# Patient Record
Sex: Female | Born: 1940 | Race: White | Hispanic: No | Marital: Married | Smoking: Never smoker
Health system: Southern US, Community
[De-identification: ages and names within clinical notes are randomized; demographics above are authoritative.]

## PROBLEM LIST (undated history)

## (undated) DIAGNOSIS — M549 Dorsalgia, unspecified: Secondary | ICD-10-CM

## (undated) DIAGNOSIS — J449 Chronic obstructive pulmonary disease, unspecified: Secondary | ICD-10-CM

## (undated) DIAGNOSIS — I4891 Unspecified atrial fibrillation: Secondary | ICD-10-CM

## (undated) DIAGNOSIS — J45909 Unspecified asthma, uncomplicated: Secondary | ICD-10-CM

## (undated) DIAGNOSIS — I509 Heart failure, unspecified: Secondary | ICD-10-CM

## (undated) DIAGNOSIS — J40 Bronchitis, not specified as acute or chronic: Secondary | ICD-10-CM

## (undated) DIAGNOSIS — N189 Chronic kidney disease, unspecified: Secondary | ICD-10-CM

## (undated) DIAGNOSIS — J439 Emphysema, unspecified: Secondary | ICD-10-CM

## (undated) DIAGNOSIS — I10 Essential (primary) hypertension: Secondary | ICD-10-CM

## (undated) DIAGNOSIS — J9610 Chronic respiratory failure, unspecified whether with hypoxia or hypercapnia: Secondary | ICD-10-CM

## (undated) HISTORY — PX: ABDOMINAL HYSTERECTOMY: SHX81

## (undated) HISTORY — PX: PARTIAL HIP ARTHROPLASTY: SHX733

## (undated) HISTORY — PX: TOTAL HIP ARTHROPLASTY: SHX124

---

## 2005-01-22 ENCOUNTER — Ambulatory Visit: Payer: Self-pay | Admitting: Unknown Physician Specialty

## 2005-02-21 ENCOUNTER — Ambulatory Visit: Payer: Self-pay

## 2005-04-06 ENCOUNTER — Other Ambulatory Visit: Payer: Self-pay

## 2005-04-13 ENCOUNTER — Ambulatory Visit: Payer: Self-pay | Admitting: General Practice

## 2005-10-27 ENCOUNTER — Ambulatory Visit: Payer: Self-pay | Admitting: Rheumatology

## 2005-11-05 ENCOUNTER — Ambulatory Visit: Payer: Self-pay | Admitting: Internal Medicine

## 2006-02-07 ENCOUNTER — Inpatient Hospital Stay: Payer: Self-pay | Admitting: Cardiology

## 2006-02-07 ENCOUNTER — Other Ambulatory Visit: Payer: Self-pay

## 2006-11-26 ENCOUNTER — Ambulatory Visit: Payer: Self-pay | Admitting: Neurosurgery

## 2007-03-13 ENCOUNTER — Ambulatory Visit: Payer: Self-pay | Admitting: Internal Medicine

## 2007-04-21 ENCOUNTER — Ambulatory Visit: Payer: Self-pay | Admitting: General Practice

## 2007-05-06 ENCOUNTER — Inpatient Hospital Stay: Payer: Self-pay | Admitting: General Practice

## 2007-08-11 ENCOUNTER — Ambulatory Visit: Payer: Self-pay | Admitting: Internal Medicine

## 2008-03-16 ENCOUNTER — Ambulatory Visit: Payer: Self-pay | Admitting: Internal Medicine

## 2009-02-24 ENCOUNTER — Emergency Department: Payer: Self-pay | Admitting: Emergency Medicine

## 2009-03-30 ENCOUNTER — Ambulatory Visit: Payer: Self-pay | Admitting: Internal Medicine

## 2009-04-20 ENCOUNTER — Inpatient Hospital Stay: Payer: Self-pay | Admitting: Unknown Physician Specialty

## 2009-04-29 ENCOUNTER — Inpatient Hospital Stay: Payer: Self-pay | Admitting: Specialist

## 2010-02-17 ENCOUNTER — Ambulatory Visit: Payer: Self-pay | Admitting: Orthopedic Surgery

## 2010-02-28 ENCOUNTER — Encounter: Admission: RE | Admit: 2010-02-28 | Discharge: 2010-02-28 | Payer: Self-pay | Admitting: Orthopedic Surgery

## 2010-03-22 ENCOUNTER — Encounter: Admission: RE | Admit: 2010-03-22 | Discharge: 2010-03-22 | Payer: Self-pay | Admitting: Orthopedic Surgery

## 2010-04-03 ENCOUNTER — Emergency Department: Payer: Self-pay | Admitting: Emergency Medicine

## 2010-04-04 ENCOUNTER — Inpatient Hospital Stay (HOSPITAL_COMMUNITY): Admission: EM | Admit: 2010-04-04 | Discharge: 2010-04-18 | Payer: Self-pay | Admitting: Emergency Medicine

## 2010-04-05 ENCOUNTER — Encounter: Payer: Self-pay | Admitting: Cardiology

## 2010-04-07 ENCOUNTER — Encounter (INDEPENDENT_AMBULATORY_CARE_PROVIDER_SITE_OTHER): Payer: Self-pay | Admitting: Internal Medicine

## 2010-04-11 ENCOUNTER — Ambulatory Visit: Payer: Self-pay | Admitting: Physical Medicine & Rehabilitation

## 2010-04-18 ENCOUNTER — Encounter: Payer: Self-pay | Admitting: Internal Medicine

## 2010-05-08 ENCOUNTER — Encounter: Payer: Self-pay | Admitting: Internal Medicine

## 2010-06-03 ENCOUNTER — Inpatient Hospital Stay (HOSPITAL_COMMUNITY): Admission: EM | Admit: 2010-06-03 | Discharge: 2010-06-04 | Payer: Self-pay | Admitting: Emergency Medicine

## 2010-06-05 ENCOUNTER — Observation Stay (HOSPITAL_COMMUNITY): Admission: EM | Admit: 2010-06-05 | Discharge: 2010-06-06 | Payer: Self-pay | Admitting: Emergency Medicine

## 2010-09-16 ENCOUNTER — Emergency Department (HOSPITAL_COMMUNITY)
Admission: EM | Admit: 2010-09-16 | Discharge: 2010-09-16 | Payer: Self-pay | Source: Home / Self Care | Admitting: Emergency Medicine

## 2010-11-01 ENCOUNTER — Emergency Department (HOSPITAL_COMMUNITY)
Admission: EM | Admit: 2010-11-01 | Discharge: 2010-11-01 | Payer: Self-pay | Source: Home / Self Care | Admitting: Emergency Medicine

## 2010-11-02 NOTE — Consult Note (Addendum)
  NAME:  Maureen Wilson, Maureen Wilson NO.:  [id number]  MEDICAL RECORD NO.:  [id number]          PATIENT TYPE:  EMS  LOCATION:  ED                           FACILITY:  [facility]  PHYSICIAN:  Alvy Beal, MD    DATE OF BIRTH:  [birth year]  DATE OF CONSULTATION: DATE OF DISCHARGE:                                CONSULTATION   ADMITTING CHIEF COMPLAINT:  Left periprosthetic hip dislocation.  BRIEF HISTORY:  This is a very pleasant 70 year old woman who is well known to my practice as well as to me.  In August 2011, she had a postural indiscretion status post total hip prosthesis placement for a femoral neck fracture in July 2011.  She had a dislocation at that time, which I reduced successfully in a closed manner.  The patient had another dislocation in December 2011, which she states was also closed reduced.  Today, the patient was getting out of the shower and she went to sit down on her bed, felt a pop, and the left hip dislocated again. She said she has been unable to ambulate since, and so she presents to the emergency room for further evaluation and treatment.  PAST MEDICAL HISTORY: 1. Hypertension. 2. Chronic back pain. 3. Colitis. 4. Reflux. 5. Hypothyroidism. 6. Osteoarthritis. 7. Status post cardiac stents. 8. Hysterectomy. 9. Knee replacement. 10.Bilateral hip fractures in July requiring ORIF.  ALLERGIES:  She is allergic to: 1. ALTACE. 2. HYDROCHLOROTHIAZIDE. 3. SULFA.  Please refer to the ER nursing and the note for specifics on her current medications.  CLINICAL EXAMINATION:  She is a pleasant woman, appears her stated age, in no acute distress.  She is alert, oriented x3.  She has intact EHL, tibialis anterior, gastrocnemius strength, 2+ distal pulses in lower extremity.  Compartments are soft, nontender.  Incisions are well healed.  She has significant left hip pain with internal rotation of the leg.  Abdomen is soft and nontender.  No shortness of  breath or chest pain.  X-rays demonstrated posterior superior dislocation of the left hip.  No evidence of a femur fracture/periprosthetic fracture.  At this point in time, under conscious sedation provided by the ER staff, I will plan on performing a closed reduction.  She has several knee immobilizers at home.  I will have a word with PT in the emergency room after she recovers from the conscious sedation and I will discuss the case with Dr. Lequita Halt.  If it is okay with him, I will let her be discharged to home from the ER and arrange followup with Dr. Lequita Halt to discuss further treatment.     Alvy Beal, MD     DDB/MEDQ  D:  11/01/2010  T:  11/01/2010  Job:  [id number]  Electronically Signed by Venita Lick MD on 11/02/2010 11:15:55 AM

## 2010-11-21 ENCOUNTER — Encounter (HOSPITAL_COMMUNITY): Payer: Medicare Other | Attending: Orthopedic Surgery

## 2010-11-21 ENCOUNTER — Other Ambulatory Visit: Payer: Self-pay | Admitting: Orthopedic Surgery

## 2010-11-21 DIAGNOSIS — Z01812 Encounter for preprocedural laboratory examination: Secondary | ICD-10-CM | POA: Insufficient documentation

## 2010-11-21 DIAGNOSIS — Z01811 Encounter for preprocedural respiratory examination: Secondary | ICD-10-CM | POA: Insufficient documentation

## 2010-11-21 LAB — URINALYSIS, ROUTINE W REFLEX MICROSCOPIC
Hgb urine dipstick: NEGATIVE
Ketones, ur: NEGATIVE mg/dL
Protein, ur: NEGATIVE mg/dL
Urine Glucose, Fasting: NEGATIVE mg/dL

## 2010-11-21 LAB — SURGICAL PCR SCREEN: Staphylococcus aureus: POSITIVE — AB

## 2010-11-21 LAB — COMPREHENSIVE METABOLIC PANEL
ALT: 16 U/L (ref 0–35)
AST: 27 U/L (ref 0–37)
Alkaline Phosphatase: 63 U/L (ref 39–117)
CO2: 27 mEq/L (ref 19–32)
Chloride: 99 mEq/L (ref 96–112)
GFR calc Af Amer: 50 mL/min — ABNORMAL LOW (ref >60)
GFR calc non Af Amer: 41 mL/min — ABNORMAL LOW (ref >60)
Sodium: 136 mEq/L (ref 135–145)
Total Bilirubin: 1.1 mg/dL (ref 0.3–1.2)

## 2010-11-21 LAB — CBC
Hemoglobin: 13.1 g/dL (ref 12.0–15.0)
MCH: 33.1 pg (ref 26.0–34.0)
MCHC: 35.1 g/dL (ref 30.0–36.0)

## 2010-11-29 ENCOUNTER — Inpatient Hospital Stay (HOSPITAL_COMMUNITY)
Admission: RE | Admit: 2010-11-29 | Discharge: 2010-12-01 | DRG: 467 | Disposition: A | Discharge status: Home Health | Payer: Medicare Other | Source: Ambulatory Visit | Attending: Orthopedic Surgery | Admitting: Orthopedic Surgery

## 2010-11-29 DIAGNOSIS — D62 Acute posthemorrhagic anemia: Secondary | ICD-10-CM | POA: Diagnosis not present

## 2010-11-29 DIAGNOSIS — X500XXA Overexertion from strenuous movement or load, initial encounter: Secondary | ICD-10-CM | POA: Diagnosis present

## 2010-11-29 DIAGNOSIS — Z96649 Presence of unspecified artificial hip joint: Secondary | ICD-10-CM

## 2010-11-29 DIAGNOSIS — Y92009 Unspecified place in unspecified non-institutional (private) residence as the place of occurrence of the external cause: Secondary | ICD-10-CM

## 2010-11-29 DIAGNOSIS — I2510 Atherosclerotic heart disease of native coronary artery without angina pectoris: Secondary | ICD-10-CM | POA: Diagnosis present

## 2010-11-29 DIAGNOSIS — Z9861 Coronary angioplasty status: Secondary | ICD-10-CM

## 2010-11-29 DIAGNOSIS — K219 Gastro-esophageal reflux disease without esophagitis: Secondary | ICD-10-CM | POA: Diagnosis present

## 2010-11-29 DIAGNOSIS — E785 Hyperlipidemia, unspecified: Secondary | ICD-10-CM | POA: Diagnosis present

## 2010-11-29 DIAGNOSIS — T84029A Dislocation of unspecified internal joint prosthesis, initial encounter: Principal | ICD-10-CM | POA: Diagnosis present

## 2010-11-29 DIAGNOSIS — E871 Hypo-osmolality and hyponatremia: Secondary | ICD-10-CM | POA: Diagnosis not present

## 2010-11-29 DIAGNOSIS — I10 Essential (primary) hypertension: Secondary | ICD-10-CM | POA: Diagnosis present

## 2010-11-30 LAB — CBC
HCT: 30.5 % — ABNORMAL LOW (ref 36.0–46.0)
MCHC: 33.4 g/dL (ref 30.0–36.0)
MCV: 96.2 fL (ref 78.0–100.0)
RDW: 12.8 % (ref 11.5–15.5)

## 2010-11-30 LAB — BASIC METABOLIC PANEL
BUN: 12 mg/dL (ref 6–23)
Calcium: 8.8 mg/dL (ref 8.4–10.5)
GFR calc non Af Amer: 55 mL/min — ABNORMAL LOW (ref >60)
Glucose, Bld: 112 mg/dL — ABNORMAL HIGH (ref 70–99)

## 2010-12-01 LAB — PROTIME-INR: INR: 1.6 — ABNORMAL HIGH (ref 0.00–1.49)

## 2010-12-01 LAB — CBC
HCT: 29.5 % — ABNORMAL LOW (ref 36.0–46.0)
Hemoglobin: 9.7 g/dL — ABNORMAL LOW (ref 12.0–15.0)
MCHC: 32.9 g/dL (ref 30.0–36.0)
RBC: 3.04 MIL/uL — ABNORMAL LOW (ref 3.87–5.11)

## 2010-12-01 LAB — BASIC METABOLIC PANEL
CO2: 26 mEq/L (ref 19–32)
Calcium: 8.4 mg/dL (ref 8.4–10.5)
Chloride: 106 mEq/L (ref 96–112)
Glucose, Bld: 97 mg/dL (ref 70–99)
Sodium: 136 mEq/L (ref 135–145)

## 2010-12-05 ENCOUNTER — Ambulatory Visit: Payer: Self-pay | Admitting: Orthopedic Surgery

## 2010-12-14 NOTE — H&P (Signed)
  NAME:  YUN, GUTIERREZ NO.:  [id number]  MEDICAL RECORD NO.:  [id number]           PATIENT TYPE:  I  LOCATION:  1603                         FACILITY:  [facility]  PHYSICIAN:  Ollen Gross, M.D.    DATE OF BIRTH:  01/26/1941  DATE OF ADMISSION:  11/29/2010 DATE OF DISCHARGE:                             HISTORY & PHYSICAL   PAST SURGICAL HISTORY:  Left hip replacement, right hip partial replacement, back surgery, hysterectomy, cardiac catheterization with stenting.  FAMILY HISTORY:  Father with multiple strokes.  Mother with multiple strokes.  SOCIAL HISTORY:  Married.  Manufacturing and textile work.  Nonsmoker. No alcohol.  Two children.  Her husband will be assisting with care after surgery.  She does have a ramp entering her home.  REVIEW OF SYSTEMS:  GENERAL:  No fevers, chills or night sweats. NEUROLOGIC:  No seizures, syncope or paralysis.  RESPIRATORY:  No shortness of breath, productive cough or hemoptysis.  CARDIOVASCULAR: No chest pain, no orthopnea.  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  GENITOURINARY: No dysuria, hematuria or discharge.  MUSCULOSKELETAL:  Left hip.  PHYSICAL EXAMINATION:  VITAL SIGNS:  Pulse 56, respirations 12, blood pressure 142/68. GENERAL:  A 70 year old white female, well-nourished, well-developed, in no acute distress.  She is alert and oriented, cooperative and pleasant, accompanied by her husband and son. HEENT:  Normocephalic, atraumatic.  Pupils are equal, round and reactive.  EOMs intact. NECK:  Supple. CHEST:  Clear. HEART:  Regular rate and rhythm without murmur.  S1 and ST noted. ABDOMEN:  Soft, round, protuberant abdomen with bowel sounds present. RECTAL/BREASTS/GENITALIA:  Not done as not pertinent to present illness. EXTREMITIES:  Left hip flexion 100, internal rotation 20, external rotation 30, abduction 30.  IMPRESSION:  Left hip instability.  PLAN:   The patient was admitted to [facility] to undergo a revision left hip versus a constrained liner left hip.     Alexzandrew L. Julien Girt, P.A.C.   ______________________________ Ollen Gross, M.D.    ALP/MEDQ  D:  11/30/2010  T:  11/30/2010  Job:  [id number]  cc:   Dewaine Oats Fax: [phone]  Electronically Signed by Patrica Duel P.A.C. on 12/04/2010 11:91:47 PM Electronically Signed by Ollen Gross M.D. on 12/13/2010 03:44:41 PM

## 2010-12-14 NOTE — H&P (Signed)
  NAME:  DENAJAH, FARIAS NO.:  [id number]  MEDICAL RECORD NO.:  [id number]           PATIENT TYPE:  I  LOCATION:  1603                         FACILITY:  [facility]  PHYSICIAN:  Ollen Gross, M.D.    DATE OF BIRTH:  December 28, 1940  DATE OF ADMISSION:  11/29/2010 DATE OF DISCHARGE:                             HISTORY & PHYSICAL   CHIEF COMPLAINT:  Left hip instability.  HISTORY OF PRESENT ILLNESS:  The patient is a 70 year old female who is well-known to Dr. Ollen Gross having previously undergone left hip surgeries following hip fractures.  She has had a total-hip put in that side.  Unfortunately, she has had three dislocations and has developed instability with her hip and it is felt that she would benefit from undergoing conversion over to a constrained liner.  ALLERGIES:  ALTACE, HYDROCHLOROTHIAZIDE and SULFA.  Also MORPHINE causes some sickness.  CURRENT MEDICATIONS:  Lipitor, atenolol, Colace, budesonide, levothyroxine, Nexium, furosemide, amlodipine, meloxicam, generic Ambien, Singulair, potassium chloride, vitamin D, Ecotrin, Centrum Silver, promethazine, clonazepam and oxycodone.  PAST MEDICAL HISTORY:  Hypertension, chronic back pain, history of collagen colitis, reflux disease, hypothyroidism, osteoarthritis, hypercholesterolemia, coronary arterial disease status post cardiac stenting.  PAST SURGICAL HISTORY:  Cardiac catheterization with stents, right hip partial replacement, right femur periprosthetic fracture.     Alexzandrew L. Julien Girt, P.A.C.   ______________________________ Ollen Gross, M.D.    ALP/MEDQ  D:  11/30/2010  T:  11/30/2010  Job:  [id number]  cc:   Dewaine Oats Fax: [phone]  Electronically Signed by Patrica Duel P.A.C. on 12/04/2010 06:23:12 PM Electronically Signed by Ollen Gross M.D. on 12/13/2010 03:44:38 PM

## 2010-12-14 NOTE — Op Note (Signed)
NAME:  Maureen Wilson, Maureen Wilson NO.:  [id number]  MEDICAL RECORD NO.:  [id number]           PATIENT TYPE:  I  LOCATION:  1603                         FACILITY:  [facility]  PHYSICIAN:  Ollen Gross, M.D.    DATE OF BIRTH:  June 08, 1941  DATE OF PROCEDURE:  11/29/2010 DATE OF DISCHARGE:                              OPERATIVE REPORT   PREOPERATIVE DIAGNOSIS:  Left total-hip arthroplasty instability.  POSTOPERATIVE DIAGNOSIS:  Left total-hip arthroplasty instability.  PROCEDURE:  Left acetabular revision to constrained liner.  SURGEON:  Ollen Gross, M.D.  ASSISTANT:  Alexzandrew L. Perkins, P.A.C.  ANESTHESIA:  General.  ESTIMATED BLOOD LOSS:  200.  DRAINS:  Hemovac x1.  COMPLICATIONS:  None.  CONDITION:  Stable to recovery room.  BRIEF CLINICAL NOTE:  Maureen Wilson is a 70 year old female who had a left total-hip arthroplasty done for a femoral neck fracture last June.  She has had three episodes of posterior dislocation.  Components all appear to be in good position on x-ray.  She has a lot of proximal muscular weakness and it is felt that a lot of the instability is due to soft tissue laxity.  She presents now for revision of the acetabular component versus placement of a constrained liner if the components are in good position.  PROCEDURE IN DETAIL:  After successful administration of general anesthetic, the patient was placed in the right lateral decubitus position with the left side up and held with the hip positioner.  Left lower extremity was isolated from her perineum with plastic drapes and prepped and draped in the usual sterile fashion.  Part of her previous posterolateral incision was reutilized.  Skin cut with a 10 blade through subcutaneous tissue to the level of the fascia lata, which was incised in line with the skin incision.  The sciatic nerve was palpated and protected.  The posterior pseudocapsule was incised off of the femur.  The hip was  then explored.  No evidence of any abnormal- appearing fluid in the joint.  Components appear to be in good position with a combined anteversion of about 40 degrees.  I dislocated the hip in a position of 90 degrees flexion, 40 degrees adduction and about 25- 30 degrees of internal rotation.  I removed the femoral head with a bone tamp.  Acetabular anteversion was about 25 degrees, which matches her native anteversion.  We created a pocket anteriorly and translocated the femur anteriorly to gain acetabular exposure.  Acetabular alignment was anatomic.  Posteriorly she was a few degrees anteverted to the initial tuberosity and her abduction angle was about 45 degrees.  I removed the acetabular liner from the acetabular shell.  The shell was well ingrown and I was very comfortable with the position of the shell.  It was essentially matching her native anatomy.  I felt that, given the excellent position of the components and the fact that she dislocates at a fairly highly rotated angle, that we were best off placing a constrained liner to keep the hip stable.  We then placed the 52-mm neutral +4 constrained liner for a 32 head.  We impacted it into the acetabular  shell and I tested it and it was circumferentially impacted.  I then placed a 32 +3 head onto the trunnion of the femoral component.  This was the same size head that we had on previously.  I used a new 32 +3, as there were some scratches on the one that was removed.  The hip was then reduced with audible locking into place.  We then impacted the locking ring circumferentially.  Range of motion was full extension and full external rotation at 70 degrees flexion, 40 degrees adduction, 50 degrees internal rotation, 90 degrees flexion, 40 degrees abduction and about 40 degrees internal rotation. There was no impingement throughout any of this range of motion.  The wound was then copiously irrigated with saline solution and  the posterior pseudocapsule reattached to the femur through drill holes with Ethibond suture.  Fascia lata was closed over a Hemovac drain with interrupted #1 Vicryl, subcutaneous closed with #1-0 and #2-0 Vicryl and subcuticular running 4-0 Monocryl.  The Hemovac drain was hooked to suction.  The incision was cleaned and dried and Steri-Strips and a bulky sterile dressing were applied.  She was then placed into a knee immobilizer, awakened and transferred to recovery in stable condition.     Ollen Gross, M.D.     FA/MEDQ  D:  11/29/2010  T:  11/30/2010  Job:  [id number]  Electronically Signed by Ollen Gross M.D. on 12/13/2010 03:44:36 PM

## 2010-12-14 NOTE — Discharge Summary (Signed)
[name], [name]            ACCOUNT NO.:  [id number]  MEDICAL RECORD NO.:  [id number]           PATIENT TYPE:  I  LOCATION:  1603                         FACILITY:  [facility]  PHYSICIAN:  Ollen Gross, M.D.    DATE OF BIRTH:  July 20, 1941  DATE OF ADMISSION:  11/29/2010 DATE OF DISCHARGE:  12/01/2010                              DISCHARGE SUMMARY   ADMITTING DIAGNOSES: 1. Left hip instability. 2. Hypertension. 3. Chronic back pain. 4. History collagenous colitis. 5. Reflux disease. 6. Hypothyroidism. 7. Osteoarthritis. 8. Hypercholesterolemia. 9. Coronary arterial disease, status post cardiac stenting.  DISCHARGE DIAGNOSES: 1. Left hip instability, status post left acetabular revision,     conversion to constrained liner. 2. Postop acute blood loss anemia, did not require transfusion. 3. Postop hyponatremia, improved. 4. Hypertension. 5. Chronic back pain. 6. History collagenous colitis. 7. Reflux disease. 8. Hypothyroidism. 9. Osteoarthritis. 10.Hypercholesterolemia. 11.Coronary arterial disease, status post cardiac stenting.  PROCEDURE:  Left acetabular revision, conversion to a constrained liner. Surgeon, Dr. Lequita Halt.  Assistant, Alexzandrew L. Perkins, P.A.C. Anesthesia, general.  Blood loss, 200 cc.  CONSULTS:  None.  BRIEF HISTORY:  Maureen Wilson is a 70 year old female with left total hip done for femoral neck fracture last June.  She had 3 episodes of history of dislocations.  Components all appeared to be in good position.  She has some proximal muscle weakness and felt be leaving to instability and soft tissue laxity.  It was felt she would benefit from undergoing conversion or revision.  LABORATORY DATA:  Preop CBC showed hemoglobin 13.1, hematocrit 37.3, white cell count 6.8, platelets 215.  PT/INR 13.0 and 0.96 with PTT of 31.  Chem panel on admission, slightly elevated creatinine of 1.28. Remaining Chem panel within normal limits.  Preop UA was  negative. Blood group type O+.  Nasal swabs were positive for staph aureus and positive for MRSA.  Postop hemoglobin down to 10.  Last done H and H of 9.7 and 29.5.  Serial protime followed per Coumadin protocol.  Last done PT/INR on the date of discharge of 19.2 and 1.60.  Serial BMETs were followed for 48 hours.  Sodium went from 136 to 134, back up to 136. Remaining electrolytes remained within normal limits.  X-rays, 2-view chest, dated September 16, 2010, no acute findings.  EKG dated November 09, 2010, sinus bradycardia, otherwise normal, on beta blocker atenolol, normal EKG confirmed by Dr. Zollie Scale COURSE:  The patient admitted to [facility], taken to OR, underwent above-stated procedure without complications.  The patient tolerated the procedure well, later transferred to the recovery room on orthopedic floor, started on p.o. and IV analgesia pain control following surgery.  She was allowed to be weightbearing as tolerated. Since she only had had liner changed out to constrained liner, we didnot have to adjust or revise the metal components.  She was pretty sore on the morning of day #1 but she was able to get up, had decent urinary output.  Sodium was down, felt to be a dilutional component because she did have a positive volume postoperatively but she was diuresing fluid well, it was allowed  to  construct back up on its own.  We DC'ed the knee immobilizer, got her up weightbearing as tolerated, walking about 30 feet.  By day #2, she was seen by Dr. Lequita Halt and she was doing fairly well and it was felt as long as she did well with the physical therapy that she would be able to go home after the second therapy session on afternoon of December 01, 2010.  She was in agreement with this, arranging for home health and plan was to be discharged home later this evening.  DISCHARGE/PLAN: 1. The patient be discharged today on December 01, 2010. 2. Discharge  diagnoses, please see above. 3. Discharge meds.  Coumadin protocol for 3 weeks.  Robaxin p.r.n.     spasm.  She has her pain medication at home which is the oxycodone.     Continue amlodipine, baby aspirin, atenolol, clonazepam, Colace,     budesonide, furosemide, levothyroxine, Lipitor, Nexium, potassium     chloride, promethazine, Singulair, and generic Ambien.  DIET:  Heart-healthy diet.  ACTIVITY:  She is weightbearing as tolerated.  Total knee protocol, home health PT, home health nursing.  Followup in 2 weeks.  DISPOSITION:  Home.  CONDITION ON DISCHARGE:  Improving.     Alexzandrew L. Julien Girt, P.A.C.   ______________________________ Ollen Gross, M.D.    ALP/MEDQ  D:  12/01/2010  T:  12/01/2010  Job:  [id number]  cc:   Dewaine Oats Fax: [phone]  Electronically Signed by Patrica Duel P.A.C. on 12/04/2010 86:57:84 PM Electronically Signed by Ollen Gross M.D. on 12/13/2010 03:44:33 PM

## 2010-12-19 LAB — DIFFERENTIAL
Basophils Relative: 1 % (ref 0–1)
Eosinophils Absolute: 0.1 10*3/uL (ref 0.0–0.7)
Eosinophils Relative: 1 % (ref 0–5)
Lymphs Abs: 1.8 10*3/uL (ref 0.7–4.0)
Monocytes Absolute: 0.9 10*3/uL (ref 0.1–1.0)
Monocytes Relative: 13 % — ABNORMAL HIGH (ref 3–12)
Neutrophils Relative %: 59 % (ref 43–77)

## 2010-12-19 LAB — CBC
HCT: 35.1 % — ABNORMAL LOW (ref 36.0–46.0)
Hemoglobin: 12 g/dL (ref 12.0–15.0)
MCH: 32.7 pg (ref 26.0–34.0)
MCHC: 34.2 g/dL (ref 30.0–36.0)
MCV: 95.6 fL (ref 78.0–100.0)

## 2010-12-19 LAB — POCT I-STAT, CHEM 8
BUN: 21 mg/dL (ref 6–23)
Calcium, Ion: 1.15 mmol/L (ref 1.12–1.32)
Chloride: 104 mEq/L (ref 96–112)
Creatinine, Ser: 1.9 mg/dL — ABNORMAL HIGH (ref 0.4–1.2)
Glucose, Bld: 95 mg/dL (ref 70–99)
Potassium: 4 mEq/L (ref 3.5–5.1)

## 2010-12-22 LAB — BASIC METABOLIC PANEL
BUN: 9 mg/dL (ref 6–23)
CO2: 25 mEq/L (ref 19–32)
CO2: 26 mEq/L (ref 19–32)
Calcium: 9 mg/dL (ref 8.4–10.5)
Chloride: 103 mEq/L (ref 96–112)
Creatinine, Ser: 0.92 mg/dL (ref 0.4–1.2)
Creatinine, Ser: 0.98 mg/dL (ref 0.4–1.2)
GFR calc Af Amer: >60 mL/min (ref >60)
GFR calc non Af Amer: >60 mL/min (ref >60)
Potassium: 4.2 mEq/L (ref 3.5–5.1)
Sodium: 140 mEq/L (ref 135–145)

## 2010-12-22 LAB — COMPREHENSIVE METABOLIC PANEL
ALT: 20 U/L (ref 0–35)
AST: 38 U/L — ABNORMAL HIGH (ref 0–37)
Alkaline Phosphatase: 72 U/L (ref 39–117)
CO2: 24 mEq/L (ref 19–32)
Calcium: 9.4 mg/dL (ref 8.4–10.5)
Chloride: 105 mEq/L (ref 96–112)
GFR calc Af Amer: >60 mL/min (ref >60)
GFR calc non Af Amer: 51 mL/min — ABNORMAL LOW (ref >60)
Glucose, Bld: 93 mg/dL (ref 70–99)
Potassium: 4.4 mEq/L (ref 3.5–5.1)
Sodium: 138 mEq/L (ref 135–145)
Total Bilirubin: 1.5 mg/dL — ABNORMAL HIGH (ref 0.3–1.2)

## 2010-12-22 LAB — DIFFERENTIAL
Basophils Absolute: 0.1 10*3/uL (ref 0.0–0.1)
Basophils Relative: 1 % (ref 0–1)
Eosinophils Absolute: 0 10*3/uL (ref 0.0–0.7)
Eosinophils Absolute: 0.1 10*3/uL (ref 0.0–0.7)
Eosinophils Relative: 1 % (ref 0–5)
Eosinophils Relative: 2 % (ref 0–5)
Lymphocytes Relative: 39 % (ref 12–46)
Lymphs Abs: 1.9 10*3/uL (ref 0.7–4.0)
Lymphs Abs: 2.4 10*3/uL (ref 0.7–4.0)
Monocytes Absolute: 0.6 10*3/uL (ref 0.1–1.0)
Neutrophils Relative %: 51 % (ref 43–77)

## 2010-12-22 LAB — CBC
HCT: 34.9 % — ABNORMAL LOW (ref 36.0–46.0)
HCT: 35.9 % — ABNORMAL LOW (ref 36.0–46.0)
Hemoglobin: 10.3 g/dL — ABNORMAL LOW (ref 12.0–15.0)
Hemoglobin: 11.8 g/dL — ABNORMAL LOW (ref 12.0–15.0)
MCH: 31.3 pg (ref 26.0–34.0)
MCH: 32.6 pg (ref 26.0–34.0)
MCV: 95.7 fL (ref 78.0–100.0)
Platelets: 223 10*3/uL (ref 150–400)
Platelets: ADEQUATE 10*3/uL (ref 150–400)
RBC: 3.29 MIL/uL — ABNORMAL LOW (ref 3.87–5.11)
RBC: 3.64 MIL/uL — ABNORMAL LOW (ref 3.87–5.11)
RDW: 15.6 % — ABNORMAL HIGH (ref 11.5–15.5)
WBC: 5.4 10*3/uL (ref 4.0–10.5)
WBC: 6.5 10*3/uL (ref 4.0–10.5)

## 2010-12-22 LAB — URINALYSIS, ROUTINE W REFLEX MICROSCOPIC
Bilirubin Urine: NEGATIVE
Glucose, UA: NEGATIVE mg/dL
Ketones, ur: NEGATIVE mg/dL
Protein, ur: NEGATIVE mg/dL
pH: 6 (ref 5.0–8.0)

## 2010-12-22 LAB — URINE MICROSCOPIC-ADD ON

## 2010-12-22 LAB — PROTIME-INR: Prothrombin Time: 13.5 seconds (ref 11.6–15.2)

## 2010-12-22 LAB — APTT: aPTT: 33 seconds (ref 24–37)

## 2010-12-22 LAB — TSH: TSH: 3.074 u[IU]/mL (ref 0.350–4.500)

## 2010-12-22 LAB — CK: Total CK: 229 U/L — ABNORMAL HIGH (ref 7–177)

## 2010-12-24 LAB — CBC
HCT: 27.3 % — ABNORMAL LOW (ref 36.0–46.0)
HCT: 28 % — ABNORMAL LOW (ref 36.0–46.0)
HCT: 30.7 % — ABNORMAL LOW (ref 36.0–46.0)
HCT: 33.2 % — ABNORMAL LOW (ref 36.0–46.0)
HCT: 33.5 % — ABNORMAL LOW (ref 36.0–46.0)
HCT: 34.3 % — ABNORMAL LOW (ref 36.0–46.0)
HCT: 35.5 % — ABNORMAL LOW (ref 36.0–46.0)
HCT: 36.3 % (ref 36.0–46.0)
Hemoglobin: 10.1 g/dL — ABNORMAL LOW (ref 12.0–15.0)
Hemoglobin: 10.9 g/dL — ABNORMAL LOW (ref 12.0–15.0)
Hemoglobin: 11 g/dL — ABNORMAL LOW (ref 12.0–15.0)
Hemoglobin: 11.6 g/dL — ABNORMAL LOW (ref 12.0–15.0)
Hemoglobin: 11.7 g/dL — ABNORMAL LOW (ref 12.0–15.0)
Hemoglobin: 12 g/dL (ref 12.0–15.0)
Hemoglobin: 12.3 g/dL (ref 12.0–15.0)
Hemoglobin: 9.8 g/dL — ABNORMAL LOW (ref 12.0–15.0)
MCH: 32.2 pg (ref 26.0–34.0)
MCH: 32.3 pg (ref 26.0–34.0)
MCH: 32.7 pg (ref 26.0–34.0)
MCH: 32.7 pg (ref 26.0–34.0)
MCH: 33.7 pg (ref 26.0–34.0)
MCH: 33.9 pg (ref 26.0–34.0)
MCH: 34 pg (ref 26.0–34.0)
MCHC: 34.7 g/dL (ref 30.0–36.0)
MCHC: 34.8 g/dL (ref 30.0–36.0)
MCHC: 35 g/dL (ref 30.0–36.0)
MCHC: 35.8 g/dL (ref 30.0–36.0)
MCV: 91.6 fL (ref 78.0–100.0)
MCV: 92.4 fL (ref 78.0–100.0)
MCV: 92.8 fL (ref 78.0–100.0)
MCV: 93.5 fL (ref 78.0–100.0)
MCV: 94.1 fL (ref 78.0–100.0)
MCV: 96.4 fL (ref 78.0–100.0)
MCV: 97.2 fL (ref 78.0–100.0)
MCV: 97.3 fL (ref 78.0–100.0)
MCV: 97.3 fL (ref 78.0–100.0)
MCV: 98.9 fL (ref 78.0–100.0)
Platelets: 157 10*3/uL (ref 150–400)
Platelets: 167 10*3/uL (ref 150–400)
Platelets: 182 10*3/uL (ref 150–400)
Platelets: 200 10*3/uL (ref 150–400)
Platelets: 200 10*3/uL (ref 150–400)
RBC: 2.83 MIL/uL — ABNORMAL LOW (ref 3.87–5.11)
RBC: 2.88 MIL/uL — ABNORMAL LOW (ref 3.87–5.11)
RBC: 3.21 MIL/uL — ABNORMAL LOW (ref 3.87–5.11)
RBC: 3.38 MIL/uL — ABNORMAL LOW (ref 3.87–5.11)
RBC: 3.53 MIL/uL — ABNORMAL LOW (ref 3.87–5.11)
RBC: 3.63 MIL/uL — ABNORMAL LOW (ref 3.87–5.11)
RBC: 3.65 MIL/uL — ABNORMAL LOW (ref 3.87–5.11)
RBC: 3.67 MIL/uL — ABNORMAL LOW (ref 3.87–5.11)
RBC: 3.91 MIL/uL (ref 3.87–5.11)
RDW: 13.5 % (ref 11.5–15.5)
RDW: 13.8 % (ref 11.5–15.5)
WBC: 13.3 10*3/uL — ABNORMAL HIGH (ref 4.0–10.5)
WBC: 5.6 10*3/uL (ref 4.0–10.5)
WBC: 8.6 10*3/uL (ref 4.0–10.5)
WBC: 8.8 10*3/uL (ref 4.0–10.5)
WBC: 9.2 10*3/uL (ref 4.0–10.5)

## 2010-12-24 LAB — BASIC METABOLIC PANEL
BUN: 12 mg/dL (ref 6–23)
BUN: 15 mg/dL (ref 6–23)
BUN: 20 mg/dL (ref 6–23)
BUN: 6 mg/dL (ref 6–23)
BUN: 6 mg/dL (ref 6–23)
CO2: 21 mEq/L (ref 19–32)
CO2: 21 mEq/L (ref 19–32)
CO2: 22 mEq/L (ref 19–32)
CO2: 24 mEq/L (ref 19–32)
CO2: 24 mEq/L (ref 19–32)
CO2: 25 mEq/L (ref 19–32)
CO2: 25 mEq/L (ref 19–32)
CO2: 25 mEq/L (ref 19–32)
CO2: 26 mEq/L (ref 19–32)
CO2: 26 mEq/L (ref 19–32)
CO2: 26 mEq/L (ref 19–32)
Calcium: 8.2 mg/dL — ABNORMAL LOW (ref 8.4–10.5)
Calcium: 8.3 mg/dL — ABNORMAL LOW (ref 8.4–10.5)
Calcium: 8.4 mg/dL (ref 8.4–10.5)
Calcium: 8.6 mg/dL (ref 8.4–10.5)
Chloride: 101 mEq/L (ref 96–112)
Chloride: 102 mEq/L (ref 96–112)
Chloride: 103 mEq/L (ref 96–112)
Chloride: 103 mEq/L (ref 96–112)
Chloride: 104 mEq/L (ref 96–112)
Chloride: 106 mEq/L (ref 96–112)
Chloride: 106 mEq/L (ref 96–112)
Chloride: 106 mEq/L (ref 96–112)
Chloride: 106 mEq/L (ref 96–112)
Chloride: 97 mEq/L (ref 96–112)
Chloride: 97 mEq/L (ref 96–112)
Creatinine, Ser: 0.93 mg/dL (ref 0.4–1.2)
Creatinine, Ser: 1.06 mg/dL (ref 0.4–1.2)
Creatinine, Ser: 1.11 mg/dL (ref 0.4–1.2)
Creatinine, Ser: 1.12 mg/dL (ref 0.4–1.2)
Creatinine, Ser: 1.13 mg/dL (ref 0.4–1.2)
Creatinine, Ser: 1.3 mg/dL — ABNORMAL HIGH (ref 0.4–1.2)
GFR calc Af Amer: 48 mL/min — ABNORMAL LOW (ref >60)
GFR calc Af Amer: 49 mL/min — ABNORMAL LOW (ref >60)
GFR calc Af Amer: 52 mL/min — ABNORMAL LOW (ref >60)
GFR calc Af Amer: 58 mL/min — ABNORMAL LOW (ref >60)
GFR calc Af Amer: 58 mL/min — ABNORMAL LOW (ref >60)
GFR calc Af Amer: 58 mL/min — ABNORMAL LOW (ref >60)
GFR calc Af Amer: 59 mL/min — ABNORMAL LOW (ref >60)
GFR calc Af Amer: >60 mL/min (ref >60)
GFR calc Af Amer: >60 mL/min (ref >60)
GFR calc Af Amer: >60 mL/min (ref >60)
GFR calc non Af Amer: 40 mL/min — ABNORMAL LOW (ref >60)
GFR calc non Af Amer: 48 mL/min — ABNORMAL LOW (ref >60)
GFR calc non Af Amer: >60 mL/min (ref >60)
Glucose, Bld: 100 mg/dL — ABNORMAL HIGH (ref 70–99)
Glucose, Bld: 92 mg/dL (ref 70–99)
Glucose, Bld: 95 mg/dL (ref 70–99)
Glucose, Bld: 96 mg/dL (ref 70–99)
Potassium: 3.2 mEq/L — ABNORMAL LOW (ref 3.5–5.1)
Potassium: 3.7 mEq/L (ref 3.5–5.1)
Potassium: 3.7 mEq/L (ref 3.5–5.1)
Potassium: 3.8 mEq/L (ref 3.5–5.1)
Potassium: 4.1 mEq/L (ref 3.5–5.1)
Potassium: 4.2 mEq/L (ref 3.5–5.1)
Potassium: 4.6 mEq/L (ref 3.5–5.1)
Potassium: 4.8 mEq/L (ref 3.5–5.1)
Sodium: 124 mEq/L — ABNORMAL LOW (ref 135–145)
Sodium: 125 mEq/L — ABNORMAL LOW (ref 135–145)
Sodium: 132 mEq/L — ABNORMAL LOW (ref 135–145)
Sodium: 133 mEq/L — ABNORMAL LOW (ref 135–145)
Sodium: 134 mEq/L — ABNORMAL LOW (ref 135–145)
Sodium: 134 mEq/L — ABNORMAL LOW (ref 135–145)
Sodium: 135 mEq/L (ref 135–145)
Sodium: 139 mEq/L (ref 135–145)

## 2010-12-24 LAB — CROSSMATCH: Antibody Screen: NEGATIVE

## 2010-12-24 LAB — URINALYSIS, ROUTINE W REFLEX MICROSCOPIC
Ketones, ur: NEGATIVE mg/dL
Nitrite: NEGATIVE
pH: 5.5 (ref 5.0–8.0)

## 2010-12-24 LAB — PROTIME-INR
INR: 1.05 (ref 0.00–1.49)
INR: 2.02 — ABNORMAL HIGH (ref 0.00–1.49)
INR: 2.4 — ABNORMAL HIGH (ref 0.00–1.49)
INR: 3.31 — ABNORMAL HIGH (ref 0.00–1.49)
Prothrombin Time: 14.6 seconds (ref 11.6–15.2)
Prothrombin Time: 17.2 seconds — ABNORMAL HIGH (ref 11.6–15.2)
Prothrombin Time: 18.5 seconds — ABNORMAL HIGH (ref 11.6–15.2)
Prothrombin Time: 22.7 seconds — ABNORMAL HIGH (ref 11.6–15.2)
Prothrombin Time: 36 seconds — ABNORMAL HIGH (ref 11.6–15.2)

## 2010-12-24 LAB — OSMOLALITY: Osmolality: 262 mOsm/kg — ABNORMAL LOW (ref 275–300)

## 2010-12-24 LAB — URINE MICROSCOPIC-ADD ON

## 2010-12-24 LAB — URINE CULTURE: Colony Count: 3000

## 2010-12-24 LAB — HEMOGLOBIN AND HEMATOCRIT, BLOOD: HCT: 40.5 % (ref 36.0–46.0)

## 2010-12-24 LAB — CREATININE, URINE, RANDOM: Creatinine, Urine: 73.6 mg/dL

## 2010-12-24 LAB — OSMOLALITY, URINE: Osmolality, Ur: 454 mOsm/kg (ref 390–1090)

## 2010-12-25 LAB — CBC
HCT: 33.1 % — ABNORMAL LOW (ref 36.0–46.0)
HCT: 37.6 % (ref 36.0–46.0)
Hemoglobin: 11.5 g/dL — ABNORMAL LOW (ref 12.0–15.0)
Hemoglobin: 12.9 g/dL (ref 12.0–15.0)
MCH: 34 pg (ref 26.0–34.0)
MCHC: 34.4 g/dL (ref 30.0–36.0)
MCV: 97.7 fL (ref 78.0–100.0)
RBC: 3.39 MIL/uL — ABNORMAL LOW (ref 3.87–5.11)

## 2010-12-25 LAB — HEMOGLOBIN A1C: Mean Plasma Glucose: 126 mg/dL — ABNORMAL HIGH (ref <117)

## 2010-12-25 LAB — COMPREHENSIVE METABOLIC PANEL
BUN: 15 mg/dL (ref 6–23)
CO2: 26 mEq/L (ref 19–32)
Chloride: 105 mEq/L (ref 96–112)
Creatinine, Ser: 1.34 mg/dL — ABNORMAL HIGH (ref 0.4–1.2)
GFR calc non Af Amer: 39 mL/min — ABNORMAL LOW (ref >60)
Total Bilirubin: 1.3 mg/dL — ABNORMAL HIGH (ref 0.3–1.2)

## 2010-12-25 LAB — BASIC METABOLIC PANEL
GFR calc non Af Amer: 33 mL/min — ABNORMAL LOW (ref >60)
Glucose, Bld: 109 mg/dL — ABNORMAL HIGH (ref 70–99)
Potassium: 4 mEq/L (ref 3.5–5.1)
Sodium: 134 mEq/L — ABNORMAL LOW (ref 135–145)

## 2010-12-25 LAB — URINALYSIS, ROUTINE W REFLEX MICROSCOPIC
Glucose, UA: NEGATIVE mg/dL
Specific Gravity, Urine: 1.02 (ref 1.005–1.030)
Urobilinogen, UA: 0.2 mg/dL (ref 0.0–1.0)
pH: 5.5 (ref 5.0–8.0)

## 2010-12-25 LAB — TSH: TSH: 1.314 u[IU]/mL (ref 0.350–4.500)

## 2010-12-25 LAB — URINE CULTURE

## 2010-12-25 LAB — URINE MICROSCOPIC-ADD ON

## 2010-12-25 LAB — DIFFERENTIAL
Basophils Relative: 1 % (ref 0–1)
Lymphocytes Relative: 9 % — ABNORMAL LOW (ref 12–46)
Monocytes Absolute: 1.2 10*3/uL — ABNORMAL HIGH (ref 0.1–1.0)
Monocytes Relative: 9 % (ref 3–12)
Neutro Abs: 10.8 10*3/uL — ABNORMAL HIGH (ref 1.7–7.7)

## 2011-01-21 ENCOUNTER — Inpatient Hospital Stay: Payer: Self-pay | Admitting: Internal Medicine

## 2011-04-19 ENCOUNTER — Ambulatory Visit: Payer: Self-pay | Admitting: Cardiology

## 2011-05-02 ENCOUNTER — Ambulatory Visit: Payer: Self-pay | Admitting: Internal Medicine

## 2011-06-09 ENCOUNTER — Emergency Department: Payer: Self-pay | Admitting: Emergency Medicine

## 2011-08-24 ENCOUNTER — Ambulatory Visit: Payer: Self-pay | Admitting: Internal Medicine

## 2011-11-29 ENCOUNTER — Ambulatory Visit: Payer: Self-pay | Admitting: Surgery

## 2011-11-29 LAB — PROTIME-INR: INR: 2

## 2011-12-04 ENCOUNTER — Ambulatory Visit: Payer: Self-pay | Admitting: Surgery

## 2012-03-25 ENCOUNTER — Ambulatory Visit: Payer: Self-pay | Admitting: Surgery

## 2012-03-25 LAB — CREATININE, SERUM
Creatinine: 1.59 mg/dL — ABNORMAL HIGH (ref 0.60–1.30)
EGFR (Non-African Amer.): 32 — ABNORMAL LOW

## 2012-05-07 ENCOUNTER — Ambulatory Visit: Payer: Self-pay | Admitting: Internal Medicine

## 2013-06-01 ENCOUNTER — Ambulatory Visit: Payer: Self-pay | Admitting: Internal Medicine

## 2013-06-05 ENCOUNTER — Ambulatory Visit: Payer: Self-pay | Admitting: Internal Medicine

## 2013-07-15 ENCOUNTER — Ambulatory Visit: Payer: Self-pay | Admitting: Unknown Physician Specialty

## 2013-08-20 ENCOUNTER — Ambulatory Visit: Payer: Self-pay | Admitting: Cardiology

## 2013-11-02 ENCOUNTER — Emergency Department: Payer: Self-pay | Admitting: Emergency Medicine

## 2013-11-02 LAB — COMPREHENSIVE METABOLIC PANEL
ALBUMIN: 3.5 g/dL (ref 3.4–5.0)
ALK PHOS: 97 U/L
ANION GAP: 7 (ref 7–16)
AST: 41 U/L — AB (ref 15–37)
BUN: 16 mg/dL (ref 7–18)
Bilirubin,Total: 1.2 mg/dL — ABNORMAL HIGH (ref 0.2–1.0)
CHLORIDE: 102 mmol/L (ref 98–107)
CREATININE: 1.33 mg/dL — AB (ref 0.60–1.30)
Calcium, Total: 8.6 mg/dL (ref 8.5–10.1)
Co2: 23 mmol/L (ref 21–32)
EGFR (African American): 46 — ABNORMAL LOW
EGFR (Non-African Amer.): 40 — ABNORMAL LOW
GLUCOSE: 102 mg/dL — AB (ref 65–99)
Osmolality: 266 (ref 275–301)
POTASSIUM: 4.2 mmol/L (ref 3.5–5.1)
SGPT (ALT): 29 U/L (ref 12–78)
SODIUM: 132 mmol/L — AB (ref 136–145)
TOTAL PROTEIN: 6.7 g/dL (ref 6.4–8.2)

## 2013-11-02 LAB — CBC WITH DIFFERENTIAL/PLATELET
BASOS PCT: 0.7 %
Basophil #: 0.1 10*3/uL (ref 0.0–0.1)
EOS ABS: 0 10*3/uL (ref 0.0–0.7)
Eosinophil %: 0.6 %
HCT: 36.1 % (ref 35.0–47.0)
HGB: 11.9 g/dL — AB (ref 12.0–16.0)
Lymphocyte #: 0.8 10*3/uL — ABNORMAL LOW (ref 1.0–3.6)
Lymphocyte %: 9.9 %
MCH: 32.3 pg (ref 26.0–34.0)
MCHC: 32.9 g/dL (ref 32.0–36.0)
MCV: 98 fL (ref 80–100)
MONO ABS: 0.7 x10 3/mm (ref 0.2–0.9)
Monocyte %: 9.3 %
NEUTROS ABS: 6.2 10*3/uL (ref 1.4–6.5)
Neutrophil %: 79.5 %
Platelet: 116 10*3/uL — ABNORMAL LOW (ref 150–440)
RBC: 3.68 10*6/uL — AB (ref 3.80–5.20)
RDW: 13.7 % (ref 11.5–14.5)
WBC: 7.9 10*3/uL (ref 3.6–11.0)

## 2013-11-02 LAB — PROTIME-INR
INR: 3
Prothrombin Time: 30.5 secs — ABNORMAL HIGH (ref 11.5–14.7)

## 2013-11-02 LAB — PRO B NATRIURETIC PEPTIDE: B-Type Natriuretic Peptide: 8669 pg/mL — ABNORMAL HIGH (ref 0–125)

## 2013-11-07 LAB — CULTURE, BLOOD (SINGLE)

## 2014-03-16 ENCOUNTER — Emergency Department: Payer: Self-pay | Admitting: Emergency Medicine

## 2014-03-16 LAB — COMPREHENSIVE METABOLIC PANEL
ALBUMIN: 3.7 g/dL (ref 3.4–5.0)
ALK PHOS: 108 U/L
AST: 36 U/L (ref 15–37)
Anion Gap: 7 (ref 7–16)
BUN: 16 mg/dL (ref 7–18)
Bilirubin,Total: 1.2 mg/dL — ABNORMAL HIGH (ref 0.2–1.0)
CHLORIDE: 97 mmol/L — AB (ref 98–107)
CO2: 25 mmol/L (ref 21–32)
CREATININE: 1.57 mg/dL — AB (ref 0.60–1.30)
Calcium, Total: 9.1 mg/dL (ref 8.5–10.1)
EGFR (Non-African Amer.): 32 — ABNORMAL LOW
GFR CALC AF AMER: 38 — AB
GLUCOSE: 101 mg/dL — AB (ref 65–99)
Osmolality: 260 (ref 275–301)
Potassium: 4.2 mmol/L (ref 3.5–5.1)
SGPT (ALT): 20 U/L (ref 12–78)
Sodium: 129 mmol/L — ABNORMAL LOW (ref 136–145)
Total Protein: 7.6 g/dL (ref 6.4–8.2)

## 2014-03-16 LAB — PROTIME-INR
INR: 2.1
PROTHROMBIN TIME: 23.1 s — AB (ref 11.5–14.7)

## 2014-03-16 LAB — TROPONIN I: Troponin-I: <0.02 ng/mL

## 2014-03-16 LAB — CBC
HCT: 40.7 % (ref 35.0–47.0)
HGB: 13.2 g/dL (ref 12.0–16.0)
MCH: 31.6 pg (ref 26.0–34.0)
MCHC: 32.4 g/dL (ref 32.0–36.0)
MCV: 98 fL (ref 80–100)
Platelet: 202 10*3/uL (ref 150–440)
RBC: 4.17 10*6/uL (ref 3.80–5.20)
RDW: 13.5 % (ref 11.5–14.5)
WBC: 8.6 10*3/uL (ref 3.6–11.0)

## 2014-03-16 LAB — CK TOTAL AND CKMB (NOT AT ARMC)
CK, Total: 71 U/L
CK-MB: 1.2 ng/mL (ref 0.5–3.6)

## 2014-04-14 ENCOUNTER — Ambulatory Visit: Payer: Self-pay | Admitting: Specialist

## 2014-06-26 ENCOUNTER — Emergency Department: Payer: Self-pay | Admitting: Internal Medicine

## 2014-06-26 LAB — COMPREHENSIVE METABOLIC PANEL
ALK PHOS: 67 U/L
ALT: 50 U/L
ANION GAP: 7 (ref 7–16)
Albumin: 3.4 g/dL (ref 3.4–5.0)
BUN: 31 mg/dL — AB (ref 7–18)
Bilirubin,Total: 0.9 mg/dL (ref 0.2–1.0)
CALCIUM: 8.6 mg/dL (ref 8.5–10.1)
CHLORIDE: 106 mmol/L (ref 98–107)
CO2: 22 mmol/L (ref 21–32)
Creatinine: 1.57 mg/dL — ABNORMAL HIGH (ref 0.60–1.30)
EGFR (African American): 38 — ABNORMAL LOW
GFR CALC NON AF AMER: 32 — AB
Glucose: 100 mg/dL — ABNORMAL HIGH (ref 65–99)
Osmolality: 277 (ref 275–301)
POTASSIUM: 3.7 mmol/L (ref 3.5–5.1)
SGOT(AST): 60 U/L — ABNORMAL HIGH (ref 15–37)
Sodium: 135 mmol/L — ABNORMAL LOW (ref 136–145)
TOTAL PROTEIN: 6.5 g/dL (ref 6.4–8.2)

## 2014-06-26 LAB — CBC
HCT: 40.3 % (ref 35.0–47.0)
HGB: 13.3 g/dL (ref 12.0–16.0)
MCH: 32.7 pg (ref 26.0–34.0)
MCHC: 32.9 g/dL (ref 32.0–36.0)
MCV: 99 fL (ref 80–100)
Platelet: 190 10*3/uL (ref 150–440)
RBC: 4.06 10*6/uL (ref 3.80–5.20)
RDW: 14.7 % — AB (ref 11.5–14.5)
WBC: 14.1 10*3/uL — ABNORMAL HIGH (ref 3.6–11.0)

## 2014-06-26 LAB — PROTIME-INR
INR: 1.8
PROTHROMBIN TIME: 20.7 s — AB (ref 11.5–14.7)

## 2014-06-26 LAB — TROPONIN I: Troponin-I: <0.02 ng/mL

## 2014-06-26 LAB — PRO B NATRIURETIC PEPTIDE: B-TYPE NATIURETIC PEPTID: 10644 pg/mL — AB (ref 0–125)

## 2014-07-15 ENCOUNTER — Ambulatory Visit: Payer: Self-pay | Admitting: Internal Medicine

## 2014-07-20 ENCOUNTER — Ambulatory Visit: Payer: Self-pay | Admitting: Specialist

## 2014-07-30 ENCOUNTER — Ambulatory Visit: Payer: Self-pay | Admitting: Specialist

## 2014-11-28 ENCOUNTER — Inpatient Hospital Stay: Payer: Self-pay | Admitting: Internal Medicine

## 2014-12-19 ENCOUNTER — Inpatient Hospital Stay: Payer: Self-pay | Admitting: Internal Medicine

## 2014-12-24 ENCOUNTER — Encounter
Admit: 2014-12-24 | Disposition: A | Discharge status: Home / Self Care | Payer: Self-pay | Attending: Internal Medicine | Admitting: Internal Medicine

## 2015-01-07 ENCOUNTER — Encounter
Admit: 2015-01-07 | Disposition: A | Discharge status: Home / Self Care | Payer: Self-pay | Attending: Internal Medicine | Admitting: Internal Medicine

## 2015-01-11 LAB — URINALYSIS, COMPLETE
BILIRUBIN, UR: NEGATIVE
Bacteria: NONE SEEN
Blood: NEGATIVE
Glucose,UR: NEGATIVE mg/dL (ref 0–75)
KETONE: NEGATIVE
Leukocyte Esterase: NEGATIVE
Nitrite: NEGATIVE
PH: 6 (ref 4.5–8.0)
Protein: NEGATIVE
RBC, UR: NONE SEEN /HPF (ref 0–5)
SPECIFIC GRAVITY: 1.005 (ref 1.003–1.030)
Squamous Epithelial: 1
WBC UR: <1 /HPF (ref 0–5)

## 2015-01-12 LAB — URINE CULTURE

## 2015-01-30 NOTE — Op Note (Signed)
PATIENT NAME:  Maureen Wilson, Maureen Wilson MR#:  [id number] DATE OF BIRTH:  04-22-1941  DATE OF PROCEDURE:  12/04/2011  PREOPERATIVE DIAGNOSIS: Right inguinal hernia.   POSTOPERATIVE DIAGNOSIS: Right inguinal hernia.   PROCEDURE PERFORMED: Laparoscopic right inguinal hernia repair.   SURGEON: Quentin Orealph L. Ely, MD   ANESTHESIA:  General.  OPERATIVE PROCEDURE: With the patient in the supine position after the induction of appropriate general anesthesia, the patient's abdomen was prepped with ChloraPrep and draped with sterile towels. The patient was placed in the head down, feet up position.  A small infraumbilical incision was made in the standard fashion and carried down bluntly through the subcutaneous tissue.  A Veress needle was used to cannulate the peritoneal cavity and CO2 was insufflated to appropriate pressure measurements. When approximately 2.5 liters of CO2 were instilled, the Veress needle was withdrawn. An 11-mm [employer] port was inserted into the peritoneal cavity. Intraperitoneal position was confirmed. CO2 was re-insufflated. Two lateral ports 5 mm in size were inserted under direct vision. The abdomen was visually inspected. There appeared to be an eventration of the left diaphragm. Otherwise, no significant abnormalities were identified other than an indirect right inguinal hernia. The peritoneum was taken down laterally across the lateral umbilical fold toward the bladder. Epigastric vessels were exposed. The defect was identified and the sac dissected away from the round ligament. It was retracted into the abdominal cavity. Cooper's ligament was clear. Atrium ProLite mesh was brought to the table and appropriately fashioned and inserted through the umbilical port. We could not get good visualization because of the short distance from her umbilicus to her pubis, so a midepigastric incision was made and another 10-mm port inserted under direct vision. The camera was moved to the upper port.  Much better visualization was then obtained. The mesh was placed behind the round ligament overlapping on Cooper's ligament and secured in place with the [employer] ProTack device. Tacks were placed across Cooper's ligament up around the aponeurotic arch. Once satisfactory coverage of the defect was obtained, the abdomen was slightly desufflated to allow for better closure of the peritoneum. The peritoneum was tacked back over the mesh using the [employer]ov[employer] ProTack device. The repair appeared to be satisfactory.   All ports were withdrawn under direct vision. Midline fascia was closed with figure-of-eight suture of 0 Vicryl. The skin was closed with 5-0 nylon. 0.2% Marcaine was injected for postoperative pain control. Sterile dressings were applied. The patient was returned to the recovery room having tolerated the procedure well. Sponge, instrument, and needle counts were correct times two in the operating room.    ____________________________ Carmie Endalph L. Ely III, MD rle:bjt D: 12/04/2011 10:12:21 ET T: 12/04/2011 11:56:20 ET JOB#: [id number]  cc: Carmie Endalph L. Ely III, MD, <Dictator> Jillene Bucksenny C. Arlana Pouchate, MD Quentin OreALPH L ELY MD ELECTRONICALLY SIGNED 12/05/2011 9:45

## 2015-02-06 ENCOUNTER — Encounter
Admission: RE | Admit: 2015-02-06 | Discharge: 2015-02-06 | Disposition: A | Discharge status: Home / Self Care | Payer: Medicare Other | Source: Ambulatory Visit | Attending: Internal Medicine | Admitting: Internal Medicine

## 2015-02-06 NOTE — Consult Note (Signed)
Wilson NAME:  Maureen Wilson, Maureen Wilson MR#:  [id number] DATE OF BIRTH:  04/27/1941  DATE OF CONSULTATION:  11/30/2014  REFERRING PHYSICIAN:   CONSULTING PHYSICIAN:  Audery Amel, MD  IDENTIFYING INFORMATION AND REASON FOR CONSULTATION: A 74 year old woman with congestive heart failure who has a consult for panic attacks.   HISTORY OF PRESENT ILLNESS: Information from Maureen chart, from Maureen Wilson, and from Maureen Wilson's daughter. Maureen Wilson's chief complaint, anxious all Maureen time. She states that since last June her nerves have been even worse than usual. Feels anxious all Maureen time. She describes Maureen anxiety as a feeling that runs down her throat into her chest. Maureen daughter observes that it is clearly worse right after she takes a breathing treatment. Anytime her shortness of breath gets worse it sets off more panicky feelings. Maureen Wilson is sleeping poorly at night. Last night she got agitated and confused and delirious and was up much of Maureen night. Daughter reports that as far as she knows at home Maureen Wilson has not been routinely getting delirious. No suicidal ideation. No evidence of active psychotic symptoms. No evidence of substance abuse.   PAST PSYCHIATRIC HISTORY: Daughter reports that decades ago Maureen Wilson lost a full term child which tore her nerves up, but it was never so bad that she was hospitalized. No history of suicidality. She has been on standing 0.5 mg 3 times a day of clonazepam for probably years from her primary care doctor.   PAST MEDICAL HISTORY: Congestive heart failure, which seems like it is getting worse. More hard to control. Also COPD, hypothyroidism, gastric reflux symptoms, history of atrial fibrillation on anticoagulant treatment.   SOCIAL HISTORY: Lives with her husband. Apparently Maureen baseline routine is Maureen Wilson actually takes care of Maureen husband rather than vice versa. Daughter lives nearby and indicates that her mother has been having a harder time taking  care of herself and Maureen home recently and so she, Maureen daughter, has been coming to help out.   CURRENT MEDICATIONS: Aspirin 81 mg a day, atenolol 100 mg twice a day, atorvastatin 20 mg at night, diltiazem 60 mg every 6 hours, Advair Diskus 1 puff twice a day, insulin sliding scale, Synthroid 50 mcg in Maureen morning, nystatin powder 3 times a day, pantoprazole 40 mg twice a day, potassium chloride 20 mEq oral daily, Spiriva HandiHaler 1 inhalation daily, 2 mg of Coumadin daily, Ambien 10 mg at night, had been getting Klonopin 0.5 mg 3 times a day, Lasix 20 mg twice a day.   ALLERGIES: ALTACE, BENICAR, HYDRALAZINE, HYDROCHLOROTHIAZIDE, METOPROLOL, MICARDIS, MORPHINE, NORVASC, SULFA DRUGS.   REVIEW OF SYSTEMS: Short of breath. Anxious. Very tired. Wants to sleep.    MENTAL STATUS EXAMINATION: Chronically and acutely ill-appearing woman interviewed in her hospital room. They had Maureen lights out at 6:00 in Maureen evening when I came to see them. Maureen Wilson was passively cooperative. Eye contact poor. Psychomotor activity very limited. Seems to be gasping for breath much of Maureen time. Speech limited in amount and somewhat difficult to understand. Affect anxious and flat. Mood stated as being anxious. Thoughts are very simple, concrete. No suicidal or homicidal ideation. Maureen Wilson initially tells me she is at home in her own bed, then corrects himself that she is in [facility]en hospital. It is not clear if she is making an effort. Seems to be coming in and out though of being a little confused. Judgment and insight probably not Maureen best.   VITAL SIGNS:  Blood pressure is currently 98/62, pulse 85, temperature 98.2.   LABORATORY RESULTS: Multiple laboratories since admission. Running high glucoses, creatinine elevated at 1.98 on admission. No drug screen or alcohol level done on admission. Urinalysis today possibly infected.   ASSESSMENT: A 74 year old woman with a history of probably generalized anxiety which is closely  tied to her medical problems. Maureen episodes she is calling panic attacks now sound like they are very closely tried to episodes of shortness of breath from her chronic obstructive pulmonary disease and congestive heart failure. Under that circumstance it may be hard in Maureen short term to get them under control and I doubt Maureen Wilson is in any condition to be actively doing psychotherapy. She been on standing doses of benzodiazepines for years. I do not want to further suppress her respiratory effort, but I think we can safely increase some of Maureen benzodiazepines.   TREATMENT PLAN: Increase Klonopin to 0.5 mg morning and midday and a full milligram at night. Added Risperdal 0.5 mg p.r.n. q. 6 hours for agitation and delirium which is likely to happen especially in Maureen hospital. Added Xanax 0.25 mg q. 8 hours p.r.n. for panic attacks. We will follow as needed.   DIAGNOSIS PRINCIPAL AND PRIMARY:   AXIS I: Anxiety disorder, not otherwise specified.   SECONDARY DIAGNOSES:   AXIS I: No further.   AXIS II: Deferred.   AXIS III:  1.  Congestive heart failure.  2.  Chronic obstructive pulmonary disease.  3.  Hypothyroidism.     ____________________________ Audery AmelJohn T. Zuri Bradway, MD jtc:bu D: 11/30/2014 18:41:10 ET T: 11/30/2014 19:19:44 ET JOB#: [id number]  cc: Audery AmelJohn T. Niguel Moure, MD, <Dictator> Audery AmelJOHN T Isiaah Cuervo MD ELECTRONICALLY SIGNED 12/14/2014 10:35

## 2015-02-06 NOTE — Discharge Summary (Signed)
PATIENT NAME:  Maureen Wilson, Maureen Wilson MR#:  [id number] DATE OF BIRTH:  [birth year]  DATE OF ADMISSION:  12/19/2014 DATE OF DISCHARGE:  12/24/2014  DISCHARGE DIAGNOSES: 1.  Hyponatremia.  2.  Acute cystitis.  3.  Chronic congestive heart failure.  CODE STATUS: Full code.   DISCHARGE MEDICATIONS: Atenolol 100 mg 1 tablet p.o. 2 times a day; levothyroxine 50 mcg 1 tablet p.o. once daily in a.m., 1-1/2 tablets on Monday and Thursday; Ambien 10 mg 1 tablet p.o. once daily at bedtime as needed for insomnia; vitamin D3, 2000 international units 1 tablet p.o. once daily; Centrum Silver 1 tablet p.o. once daily; aspirin 81 mg p.o. once daily; Combivent 1 puff inhalation 4 times a day; montelukast 10 mg 1 tablet p.o. once daily in the evening; cetirizine 10 mg once daily; risperidone 0.5 mg 1 tablet p.o. 3 times a day as needed for delirium, agitation; Spiriva 18 mcg 1 tablet inhalation once daily; diltiazem 180 mg extended release 1 capsule p.o. once daily; Nexium over-the-counter 20 mg 2 capsules p.o. once daily; Zofran 4 mg 1 tablet p.o. 2 times a day as needed for nausea and vomiting; prednisone 5 mg once daily; Coumadin 1 mg p.o. once daily; tramadol 50 mg p.o. every 6 hours as needed for pain; atorvastatin 20 mg 1 tablet p.o. at bedtime; clonazepam 1 mg p.o. once daily; nystatin 100,000 units per gram, apply topically to affected area 3 times a day; Flonase 50 mcg per inhalation once nasally once daily as needed; clonazepam 0.5 mg 1 tablet p.o. 2 times a day in the morning and the afternoon; alprazolam 0.25 mg 1 tablet p.o. every 8 hours as needed for anxiety; Colace 100 mg p.o. 2 times a day as needed for constipation; tamsulosin 0.4 mg 1 capsule p.o. once daily; senna 1 tablet p.o. 2 times a day as needed for constipation; ciprofloxacin 500 mg 1 tablet p.o. every 12 hours until the antibiotic course is completed as described. Discontinued Lexapro in view of hyponatremia. Continue home oxygen 3 L via nasal  cannula.   DIET: Low sodium, low fat. Continue fluid restriction 1500 mL per day. Repeat PT, INR in 2 days for Coumadin dosing.   FOLLOWUP: Followup appointments with primary care physician in 1 week, nephrology in 1 week, cardiology in 1 week.  CONSULTATIONS: Nephrology, Dr. Mosetta PigeonHarmeet Singh; cardiology group by Dr. Gwen PoundsKowalski.   PROCEDURES: None.  BRIEF HISTORY AND PHYSICAL AND HOSPITAL COURSE: The patient is a 74 year old Caucasian female just recently admitted to the hospital on February 21 through 24 with congestive heart failure, is brought in to the ED after she was feeling weak and had 2 falls. The patient lives on chronic 3 L of oxygen. The patient uses a walker with ambulation. Daughter reports that her urine was very foul-smelling. The patient was found to have a urinary tract infection. Please review history and physical for details.   Hospital course based on the problem:  1.  Generalized weakness and mechanical fall. This was thought to be from severe hyponatremia. The patient was seen by nephrology regarding that. Urine sodium and creatinine were ordered. Urine osmolality was ordered and subsequently the patient was kept on fluid restriction at 1500 mL per day. She was also evaluated by physical therapy regarding generalized weakness.  Hyponatremia significantly improved with fluid restriction and on the day of discharge the sodium was at 127. Nephrology has followed up on the patient and serial sodiums were monitored during the hospital course.  2.  Mechanical fall.  The patient has seen by physical therapy. Left hip x-ray with no fractures. Physical therapy has recommended skilled nursing care as the patient was extremely weak and tired. Family was in agreement to send the patient to a skilled nursing facility for temporary rehabilitation.  3.  Urinary tract infection. The patient was treated with IV ciprofloxacin during the hospital course.  4.  Severe symptomatic hyponatremia. Initial  sodium was at 127. She was given 3% hypertonic saline at 40 mL/h. By next day the sodium was at 137 and the 3% normal saline was discontinued. Serial sodiums were still monitored and by the next day the sodium dropped down to 127 and the previous day reading 137 was thought to be lab error. Serial sodiums were checked. Restricted fluid intake 1500 mL per day was continued. Her home diuretics were held. The hyponatremia was thought to be from overdiuresing during the previous admission for congestive heart failure. With the 1500 mL fluid restriction per day, her sodium significantly improved . 5.  Urinary tract infection secondary to pseudomonas and E. coli. According to the urine culture, both pseudomonas and E. coli are sensitive to fluoroquinolones. The patient was discharged with ciprofloxacin.  6.  Chronic atrial fibrillation, rate controlled. The patient is on Coumadin. PT, INR were monitored closely. On the day of discharge, INR was 2.4. The patient's  atenolol and Cardizem were initially held because of the hypotension and restarted  low-dose Cardizem, as the patient became tachycardic, but at the time of discharge her heart rate was controlled and the plan is to continue Coumadin and repeat PT, INR in 2 days.  7.  Chronic diastolic congestive heart failure and exacerbation. The patient was on torsemide, which was held because of the severe hyponatremia and weakness. This needs close monitoring of laboratory and symptoms for fluid overload.  8.  For hyperlipidemia, statin was continued.   Overall, patient's condition significantly improved. Constipation for 7 days was resolved and she had a significant amount of large bowel movement and decision was made to discharge the patient to skilled nursing care for continuation of rehabilitation. Condition at the time of discharge is satisfactory. Plan of care was discussed with the patient and daughter and all their questions were answered.  SIGNIFICANT  LABORATORIES AND IMAGING STUDIES: On March 18, glucose 116, BUN 15, creatinine 1.01, sodium 127, potassium 3.5, chloride 92. Anion gap 7. WBC on March 17, 8.4; hemoglobin and hematocrit are normal; platelet are 129,000. On March 18, PT 27.0, INR 2.5.  The plan of care was discussed with the patient and her daughter. They both verbalized understanding of the plan.  TOTAL TIME SPENT ON DISCHARGE: 45 minutes.    ____________________________ Ramonita Lab, MD ag:ST D: 12/27/2014 23:36:31 ET T: 12/28/2014 00:40:32 ET JOB#: [id number]  cc: Ramonita Lab, MD, <Dictator> Primary care physician Ramonita Lab MD ELECTRONICALLY SIGNED 01/07/2015 12:57

## 2015-02-06 NOTE — Consult Note (Signed)
Chief Complaint:  Subjective/Chief Complaint Patient states she feels reasonably well she had an episode at home that she thought she is going to die but when she did diet and waited till morning she called rescue and now she feels reasonably well normal symptoms states to feel well enough to go home.   VITAL SIGNS/ANCILLARY NOTES: **Vital Signs.:   23-Feb-16 05:31  Vital Signs Type Routine  Temperature Temperature (F) 97.4  Celsius 36.3  Temperature Source oral  Pulse Pulse 115  Respirations Respirations 18  Systolic BP Systolic BP 161  Diastolic BP (mmHg) Diastolic BP (mmHg) 93  Mean BP 100  Pulse Ox % Pulse Ox % 99  Pulse Ox Activity Level  At rest  Oxygen Delivery 2L  *Intake and Output.:   Daily 23-Feb-16 07:00  Grand Totals Intake:  360 Output:  700    Net:  -340 24 Hr.:  -340  Oral Intake      In:  360  Urine ml     Out:  700  Length of Stay Totals Intake:  720 Output:  2550    Net:  -1830   Brief Assessment:  GEN well nourished, no acute distress   Cardiac Regular  murmur present  -- LE edema   Respiratory normal resp effort  clear BS   Gastrointestinal Normal   Gastrointestinal details normal Soft  Nontender  Nondistended   EXTR negative cyanosis/clubbing, negative edema   Lab Results: Routine Chem:  23-Feb-16 05:19   Glucose, Serum  111  BUN  27  Creatinine (comp)  1.90  Sodium, Serum 139  Potassium, Serum 3.6  Chloride, Serum  97  CO2, Serum 31  Calcium (Total), Serum 9.0  Anion Gap 11  Osmolality (calc) 283  eGFR (African American)  33  eGFR (Non-African American)  28 (eGFR values <2m/min/1.73 m2 may be an indication of chronic kidney disease (CKD). Calculated eGFR, using the MRDR Study equation, is useful in  patients with stable renal function. The eGFR calculation will not be reliable in acutely ill patients when serum creatinine is changing rapidly. It is not useful in patients on dialysis. The eGFR calculation may not be  applicable to patients at the low and high extremes of body sizes, pregnant women, and vegetarians.)   Radiology Results: XRay:    21-Feb-16 15:27, Chest PA and Lateral  Chest PA and Lateral   REASON FOR EXAM:    SOB x 3 days  COMMENTS:       PROCEDURE: DXR - DXR CHEST PA (OR AP) AND LATERAL  - Nov 28 2014  3:27PM     CLINICAL DATA:  Shortness of breath for 3 days, chest pain    EXAM:  CHEST  2 VIEW    COMPARISON:  06/26/2014    FINDINGS:  Cardiomegaly. Central mild vascular congestion. Mild perihilar  interstitial prominence suspicious for mild interstitial edema. No  segmental infiltrate. Stable degenerative changes thoracic spine.  Degenerative changes bilateral shoulders.     IMPRESSION:  Cardiomegaly. Central mild vascular congestion and mild perihilar  interstitial prominence suspicious for mild interstitial edema. No  segmental infiltrate. Degenerative changes bilateral shoulders.      Electronically Signed    By: LEston EstersD.    On: 11/28/2014 15:36         Verified By: LEphraim Hamburger M.D.,    22-Feb-16 09:36, Chest PA and Lateral  Chest PA and Lateral   REASON FOR EXAM:    CHF  COMMENTS:  PROCEDURE: DXR - DXR CHEST PA (OR AP) AND LATERAL  - Nov 29 2014  9:36AM     CLINICAL DATA:  Follow-up CHF.  Shortness of breath several days.    EXAM:  CHEST  2 VIEW    COMPARISON:  11/28/2014    FINDINGS:  Cardiomegaly with improving vascular congestion and perihilar  opacities, likely improving edema/CHF. No overt edema or confluent  opacity currently. No effusions.     IMPRESSION:  Cardiomegaly.  Improving/resolving edema/CHF.      Electronically Signed    By: Rolm Baptise M.D.    On: 11/29/2014 09:59         Verified By: Raelyn Number, M.D.,  Cardiology:    21-Feb-16 14:56, ED ECG  Ventricular Rate 118  Atrial Rate 133  QRS Duration 90  QT 288  QTc 403  R Axis 15  T Axis -11  ECG interpretation   Atrial fibrillation with rapid  ventricular response with premature ventricular or aberrantly conducted complexes  Cannot rule out Anterior infarct , age undetermined  Abnormal ECG  When compared with ECG of 26-Jun-2014 07:36,  Nonspecific T wave abnormality, worse in Inferior leads  Nonspecific T wave abnormality now evident in Anterolateral leads  ----------unconfirmed----------  Confirmed by OVERREAD, NOT ([id number]), editor PEARSON, BARBARA ([id number]) on 11/29/2014 12:14:23 PM  ED ECG     22-Feb-16 10:38, Echo Doppler  Echo Doppler   REASON FOR EXAM:      COMMENTS:       PROCEDURE: [facility] - ECHO DOPPLER COMPLETE(TRANSTHOR)  - Nov 29 2014 10:38AM     RESULT: Echocardiogram Report    Patient Name:   Maureen Wilson Date of Exam: 11/29/2014  Medical Rec #:  [id number]                Custom1:  Date of Birth:  05/21/1941             Height:       63.0 in  Patient Age:    74 years              Weight:       218.0 lb  Patient Gender: F                     BSA:          2.01 m??    Indications: CHF  Sonographer:    Sherrie Sport RDCS  Referring Phys: Demetrios Loll    Summary:   1. Left ventricular ejection fraction, by visual estimation, is 50 to   55%.   2. Normal global left ventricular systolic function.   3. Severely dilated left atrium.   4. Severely dilated right atrium.   5. Severe tricuspid regurgitation.   6. Severe mitral valve regurgitation.   7. Mildly increased left ventricular internal cavity size.   8. Moderately elevated pulmonary artery systolic pressure.  2D AND M-MODE MEASUREMENTS (normal ranges within parentheses):  Left Ventricle:          Normal  IVSd (2D):      1.36 cm (0.7-1.1)  LVPWd (2D):     1.00 cm (0.7-1.1) Aorta/LA:                  Normal  LVIDd (2D):     5.21 cm (3.4-5.7) Aortic Root (2D): 2.90 cm (2.4-3.7)  LVIDs (2D):     4.07 cm           Left Atrium (2D):  7.00 cm (1.9-4.0)  LV FS (2D):     21.9 %   (>25%)  LV EF (2D):     43.9 %   (>50%)                                    Right Ventricle:                                     RVd (2D):        6.33 cm  LV DIASTOLIC FUNCTION:  MV Peak E: 1.35 m/s E/e' Ratio: 10.60                      Decel Time: 95 msec  SPECTRAL DOPPLER ANALYSIS (where applicable):  Mitral Valve:  MV P1/2 Time: 27.55 msec  MV Area, PHT: 7.99 cm??  Aortic Valve: AoV Max Vel: 0.80 m/s AoV Peak PG: 2.6 mmHg AoV Mean PG:  LVOT Vmax: 0.80 m/s LVOT VTI:  LVOT Diameter: 2.10 cm  AoV Area, Vmax: 3.48 cm?? AoV Area, VTI:  AoV Area, Vmn:  Tricuspid Valve and PA/RV Systolic Pressure: TR Max Velocity: 3.70 m/s RA   Pressure: 5 mmHg RVSP/PASP: 59.7 mmHg  Pulmonic Valve:  PV Max Velocity: 0.60 m/s PV Max PG: 1.4 mmHg PV Mean PG:    PHYSICIAN INTERPRETATION:  Left Ventricle: The left ventricular internal cavity size was mildly   increased. LV posterior wall thickness was normal. Global LV systolic   function was normal. Left ventricular ejection fraction, by visual   estimation, is 50 to 55%.  Right Ventricle: Normal right ventricular size, wall thickness, and   systolic function. RV wall thickness is normal.  Left Atrium: The left atrium is severely dilated.  Right Atrium: The right atrium is severely dilated.  Pericardium: There is no evidence of pericardial effusion.  Mitral Valve: Severe mitral valve regurgitation is seen.  Tricuspid Valve: Severe tricuspid regurgitation is visualized. The   tricuspid regurgitant velocityis 3.70 m/s, and with an assumed right   atrial pressure of 5 mmHg, the estimated right ventricular systolic   pressure is moderately elevated at 59.7 mmHg.  Aortic Valve: The aortic valve is trileaflet and structurally normal,   with normal leaflet excursion; without any evidence of aortic stenosis or   insufficiency.  Aorta: The aortic root and ascending aorta are structurally normal, with   no evidence of dilitation.    [id number] Serafina Royals MD  Electronically signed by [id number] Serafina Royals MD  Signature Date/Time: 11/29/2014/12:53:29  PM    *** Final ***    IMPRESSION: .        Verified By: Corey Skains  ([facility]), M.D., MD   Assessment/Plan:  Assessment/Plan:  Assessment altered mental status  TIA  abnormal EKG  elevated troponin  hypertension  history of congestive heart failure  mild dehydration  arthritis .   Plan agree with rule out for myocardial infarction  follow-up cardiac enzymes   probable demand ischemia  continue blood pressure control  aspirin therapy for arteriosclerotic vascular disease  mild pain control for arthritis  outpatient follow-up with AP  as an outpatient  no evidence of heart failure   Electronic Signatures: Lujean Amel D (MD)  (Signed 23-Feb-16 12:20)  Authored: Chief Complaint, VITAL SIGNS/ANCILLARY NOTES, Brief Assessment, Lab Results, Radiology Results, Assessment/Plan  Last Updated: 23-Feb-16 12:20 by Lujean Amel D (MD)

## 2015-02-06 NOTE — H&P (Signed)
PATIENT NAME:  Maureen Wilson, [name] MR#:  [id number] DATE OF BIRTH:  01/27/1941  DATE OF ADMISSION:  12/18/2014  PRIMARY CARE PHYSICIAN:  Katherina Rightenny C. Arlana Pouchate, MD  CHIEF COMPLAINT: "I had 2 falls today and feeling very weak."    HISTORY OF PRESENT ILLNESS: Maureen Wilson is a 74 year old Caucasian female who was recently admitted in the hospital from February 21 to February 24 with congestive heart failure, went home with home health, wears chronic oxygen, comes in to the Emergency Room after she has had 2 mechanical falls today. She uses a walker with ambulation. Per daughter, the patient's urine also is very foul smelling. She was found to have UTI, generalized weakness, and fall.  She is being admitted for further evaluation and management.    PAST MEDICAL HISTORY:   1.  Chronic systolic congestive heart failure.   2.  Atrial fibrillation, on Coumadin. 3.  Osteoporosis. 4.  Arthritis. 5.  CAD, status post stent. 6.  Depression. 7.  Hyperlipidemia. 8.  GERD. 9.  Hypertension. 10.  CVA/TIA.  11.  Right knee surgery. 12.  Back surgery. 13.  Bilateral shoulder surgeries. 14.  Vaginal hysterectomy. 15.  Hiatal hernia. 16.  History of colon polyps. 17.  Hypothyroidism.  ALLERGIES: ALTACE, BENICAR, HYDRALAZINE, HYDROCHLOROTHIAZIDE, METOPROLOL, MICARDIS, MORPHINE, NORVASC, AND SULFA.  CURRENT MEDICATIONS:   1.  Ambien 10 mg p.o. daily at bedtime. 2.  Warfarin 1 mg daily. 3.  Vitamin D3 at 2000 international units p.o. daily. 4.  Tramadol 50 mg every 6 hours as needed. 5.  Torsemide 20 mg 3 tables, that is 60 mg once a day. 6.  Tiotropium Handi-haler once daily. 7.  Risperidone 0.5 mg at bedtime p.r.n.  8.  Promethazine 25 mg every 8 hours as needed. 9.  Prednisone 5 mg daily. 10.  Zofran 4 mg b.i.d.  11.  Nystatin apply to affected area as needed.   12.  Nexium over-the-counter 20 mg 2 capsules p.o. daily. 13.  Singulair 10 mg daily. 14. MiraLax daily.   15.  Lipitor 20 mg daily. 16.   Lexapro 10 mg daily, this was recently started. 17.  Docusate 1 capsule b.i.d.  18.  Diltiazem XR 180 mg p.o. daily. 19.  Combivent Respimat 1 inhalation 4 times a day. 20.  Clonazepam 1 mg at bedtime. 21.  Clonazepam 0.5 mg b.i.d.  22.  Cetirizine 10 mg daily. 23.  Centrum Silver p.o. daily. 24.  Breo Ellipta 10/25 mcg 1 puff b.i.d.  25.  Atenolol 100 mg b.i.d.   26.  Aspirin 81 mg daily. 27.  Alprazolam 0.25 every 8 hours as needed.  REVIEW OF SYSTEMS:   CONSTITUTIONAL: Positive for fatigue, weakness. No fever. EYES: No blurred or double vision, glaucoma, or cataracts. EARS, NOSE, THROAT: No tinnitus, ear pain, or hearing loss. RESPIRATORY: No cough, wheeze, or hemoptysis.  CARDIOVASCULAR: No chest pain, orthopnea, or edema.    GASTROINTESTINAL: No nausea, vomiting, diarrhea, or abdominal pain. GENITOURINARY: No dysuria, hematuria, or frequency. ENDOCRINE: No polyuria, nocturia, or thyroid problems. HEMATOLOGY: No anemia or easy bruising.   SKIN: No acne, rash, or lesion.  MUSCULOSKELETAL: Positive for arthritis, back pain, and leg weakness. NEUROLOGIC: No CVA or TIA. Generalized weakness.   PSYCHIATRIC: Positive for depression and anxiety. No bipolar disorder.    All other systems reviewed are negative.    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 She has home health services arranged.    FAMILY HISTORY: Obtained from old  records. Mother died at age 43 of CAD, had hypertension and hyperlipidemia. Father had CAD.    PHYSICAL EXAMINATION:   GENERAL: The patient is awake, alert, oriented x 3, not in acute distress.   VITAL SIGNS: Afebrile, pulse is 75, blood pressure is 130/83, saturations are 95% on 3 liters oxygen.   HEENT: Atraumatic, normocephalic. Pupils: PERRLA. EOM intact. Oral mucosa is moist.   NECK: Supple, no JVD, no carotid bruit.   RESPIRATORY: Clear to auscultation bilaterally. There are decreased breath sounds in the bases. No  rales, rhonchi, respiratory distress, or labored breathing.  CARDIOVASCULAR: Irregularly irregular heart rhythm. No murmur heard. PMI not lateralized. Chest nontender.   EXTREMITIES: Good pedal pulses, good femoral pulses. No lower extremity edema.  ABDOMEN: Soft, benign, nontender. No organomegaly. Positive bowel sounds.   NEUROLOGIC: Limited exam secondary to patient participation. The patient has no focal neurological weakness. Gait not tested. Reflexes 1+ in both upper and lower extremities.   SKIN: Warm and dry.   PSYCHIATRIC: The patient is awake, alert, oriented x 3.    LABORATORY DATA: UA positive UTI. Chemistry is still pending. White count is 16.1, H and H are 15.7 and 47.3, platelet count is 209,000.    Left hip x-ray shows bilateral hip prosthesis with severe osseous demineralization. No acute bony abnormality.    Chest x-ray: Enlargement of cardiac silhouette with left basilar atelectasis.    CT of the head shows no acute intracranial abnormality, mild cerebral atrophy with mild chronic microvascular ischemic changes in the cerebral white matter.    EKG shows atrial fibrillation with PVCs. Rate is 81.    ASSESSMENT: A 74 year old, Maureen Wilson, with multiple medical problems who was recently admitted, just discharged February 26, with acute congestive heart failure, comes in today with generalized weakness, mechanical fall x 2 at home. She was found to have: 1.  Generalized weakness with mechanical fall. The patient has significant arthritis with bilateral hip surgeries. Her left hip x-ray looks okay. We will have physical therapy see the patient in consultation. She uses a walker at home. 2.  Urinary tract infection could also be the cause for her weakness. We will give her intravenous Cipro. The patient is prone for urinary tract infection, recently completed a course with oral Ceftin. Send urine culture and monitor her white count.   3.  Hypertension. Continue her beta blockers.    4.  Chronic atrial fibrillation, on atenolol, diltiazem, and anticoagulation with Coumadin. 5.  Chronic respiratory failure. The patient is on 3 liters nasal cannula oxygen. We will continue her inhalers and breathing treatments. She is not in respiratory failure at present.   6.  Chronic arthritis with back pain and joint pain. We will continue tramadol p.r.n. which is her home medication.   7.  Hyperlipidemia, on atorvastatin.   8.  Deep vein thrombosis. The patient is already on Coumadin. 9.  Care management for discharge planning.    Above was discussed with the patient and patient's family members.  CODE STATUS: The patient is a full code.   TIME SPENT: 50 minutes.     ____________________________ Wylie Hail Allena Katz, MD sap:at D: 12/18/2014 20:40:00 ET T: 12/18/2014 21:42:37 ET JOB#: [id number]  cc: Jillene Bucks. Arlana Pouch, MD Dellas Guard A. Allena Katz, MD, <Dictator>    Willow Ora MD ELECTRONICALLY SIGNED 01/10/2015 12:19

## 2015-02-06 NOTE — Consult Note (Signed)
PATIENT NAME:  Maureen Wilson, Maureen Wilson MR#:  [id number] DATE OF BIRTH:  1941/07/29  DATE OF CONSULTATION:  12/19/2014  REFERRING PHYSICIAN: Hope PigeonVaibhavkumar G. [name], MD  CONSULTING PHYSICIAN:  Lamar BlinksBruce J. Caeleb Batalla, MD  REASON FOR CONSULTATION: Chronic atrial fibrillation with variable heart rate, acute on chronic congestive heart failure, elevated troponin with urinary tract infections.   CHIEF COMPLAINT: Weak.   HISTORY OF PRESENT ILLNESS: This is a 74 year old female with known chronic atrial fibrillation with good heart rate control on diltiazem and beta blocker who has had previous cerebrovascular accident; known coronary artery disease without current angina; with known chronic kidney disease, stage IV, with recurrent episodes of urinary tract infections. The patient has had a recurrent current urinary tract infection with significant elevation of white blood cell count, weakness and fatigue, hypotension and bradycardia. With this, the patient was too weak to stand and was seen in the Emergency Room. At that time, the patient, therefore, required hospitalization. She is now on intravenous fluids as well as continued issues of antibiotics. She does have an elevated troponin of 0.06 most consistent with demand ischemia and/or current situation and illness rather than acute coronary syndrome. The patient has had diastolic dysfunction heart failure in the past and currently has no evidence of significant findings of heart failure.   REVIEW OF SYSTEMS: Negative for vision change, ringing in the ears, hearing loss, cough, congestion, heartburn, nausea, vomiting, diarrhea, bloody stool, stomach pain, extremity pain, leg weakness, cramping of the buttocks, known blood clots, headaches, nosebleeds, congestion, trouble swallowing, frequent urination, urination at night, muscle weakness, numbness, anxiety, depression, skin lesions, or skin rashes.   PAST MEDICAL HISTORY:  1.  Chronic nonvalvular atrial  fibrillation.  2.  Diastolic dysfunction heart failure.  3.  Chronic kidney disease, stage IV.  4.  Recurrent urinary tract infections.  5.  Coronary artery disease.  6.  Cerebrovascular accident in the past.   FAMILY HISTORY: History of cardiovascular disease with hypertension.   SOCIAL HISTORY: The patient currently denies alcohol or tobacco use.   ALLERGIES: As listed.   MEDICATIONS: As listed.   PHYSICAL EXAMINATION:  VITAL SIGNS: Blood pressure is 90/60 bilaterally. Heart rate is 62 upright, reclining, and irregular.  GENERAL: She is a well-appearing elderly female in no acute distress.  HEENT: No icterus, thyromegaly, ulcers, hemorrhage, or xanthelasma.  CARDIOVASCULAR: Irregularly irregular. Normal S1, S2. Distant heart sounds. PMI is diffuse. Carotid upstroke normal without bruit. Jugular venous pressure not seen.  LUNGS: Have a few expiratory wheezes, a few basilar crackles.  ABDOMEN: Soft, nontender. Cannot assess hepatosplenomegaly or masses due to increased abdominal girth.  EXTREMITIES: Show 2+ radial, no dorsal pedal or femoral artery pulses felt with trace to 1+ lower extremity edema. No cyanosis, clubbing, or ulcers.  NEUROLOGIC: She is oriented to time, place, and person with normal mood and affect.   ASSESSMENT: A 74 year old female with chronic nonvalvular atrial fibrillation with variable heart rate due to significant recent urinary tract infection and falls needing adjustments of medication with elevated troponin consistent with demand ischemia and illness without evidence of acute coronary syndrome with known coronary disease without current angina, needing further treatment options.   RECOMMENDATIONS:  1.  Discontinuation of diltiazem and atenolol at this time and reinstatement as able over the next several hours to days for heart rate control.  2.  Antibiotics for urinary tract infections, which are likely the cause of recent falls and weakness.  3.  No further  intervention of elevated troponin consistent  with current illness rather than acute coronary syndrome.  4.  No Lasix use at this time for diastolic dysfunction due to concerns of worsening chronic kidney disease.  5.  No further intervention of coronary artery disease without angina at this time.  6.  Begin ambulation if able for further intervention and treatment options after above.    ____________________________ Lamar Blinks, MD bjk:bm D: 12/19/2014 19:14:28 ET T: 12/19/2014 23:00:06 ET JOB#: [id number]  cc: Lamar Blinks, MD, <Dictator> Lamar Blinks MD ELECTRONICALLY SIGNED 12/23/2014 8:10

## 2015-02-06 NOTE — Discharge Summary (Signed)
PATIENT NAME:  Maureen Wilson, Maureen Wilson MR#:  [id number] DATE OF BIRTH:  09-Sep-1941  DATE OF ADMISSION:  11/28/2014 DATE OF DISCHARGE:  12/01/2014  DISCHARGE DIAGNOSES:  1. Acute on chronic systolic heart failure.  2. Chronic respiratory failure, needing home oxygen.  3. Chronic kidney disease, stage III.  4. Hypertension.  5. Anxiety and panic attacks.  6. Hyperglycemia.  7. Heart failure.   MEDICATIONS ON DISCHARGE:  1. Atenolol 100 mg oral 2 times a day.  2. Levothyroxine 50 mcg oral once a day in morning and take 1.5 tablets on Monday and Thursday.  3. Lipitor 20 mg oral tablet once a day.  4. Meloxicam 15 mg oral tablet once a day.  5. Zolpidem 10 mg oral tablet once a day at bedtime as needed.  6. Vitamin D3 2000 international units once a day.  7. Centrum Silver therapeutic once a day.  8. Aspirin 81 mg once a day.  9. Breo 1 puff inhalation once a day.  10. Esomeprazole 22.3 mg oral once a day.  11. Torsemide 20 mg oral tablet once a day.  12. Prednisone 10 mg oral tablet once a day.  13. Combivent 1 puff inhalation 4 times a day.  14. Montelukast 10 mg oral once a day.  15. Ondansetron 4 mg oral tablet 2 times a day as needed for nausea and vomiting.  16. Promethazine 25 mg oral tablet every 8 hours as needed for nausea and vomiting.  17. Tramadol 50 mg oral every 6 hours as needed for pain.  18. Cetirizine 10 mg oral once a day.  19. Warfarin 1 mg oral 2 tablets once a day.  20. Risperidone 0.5 mg oral tablet 3 times a day as needed for delirium and agitation.  21. Alprazolam 0.25 mg oral every 8 hours for 20 days.  22. Clonazepam 0.5 mg oral tablet 2 times a day.  23. Spiriva 18 mcg inhalation capsule once a day.  24. Cardizem 180 mg extended release capsules once a day.  25. Ceftin 250 mg oral tablet 2 times a day for 4 days.  26. Ensure 3 times a day with meals.  27. Nystatin 100,000 unit topical powder to affected area 3 times a day.   HOME HEALTH ON DISCHARGE: Yes.    Advised to have physical therapy and nurse aide and home oxygen 3 liters nasal cannula on discharge.   Low-sodium, low-fat, low-cholesterol diet. Advised to have a regular consistency diet.   Activity as tolerated.   Advised to follow in 1 to 2 weeks in the cardiology clinic with Dr. Gwen Pounds.   HISTORY OF PRESENT ILLNESS: A 74 year old female with a history of chronic atrial fibrillation on anticoagulation,COPD, and asthma with congestive heart failure, presented to Emergency Room with chest pain, which was worsening, shortness of breath and weakness. In the Emergency Room, she was noted to be hypoxic and rapid atrial fibrillation. Chest x-ray suggested worsening heart failure. She continued to have chest pain and shortness of breath and so admitted for further evaluation.   HOSPITAL COURSE AND STAY:  1. Acute on chronic systolic congestive heart failure, ejection fraction noted to be 50%. IV Lasix was given. Cardiology consult was done and they suggested no further workup, so we discharged her home.  2. Rapid atrial fibrillation. She was on atenolol, required 1-time injection Cardizem dose. After that, heart rate remained under control. INR was therapeutic on Coumadin. Started on long acting Cardizem for better control.  3. Chest pain. Monitored on telemetry.  Cardiology followed the patient. Troponin remained stable so most likely, it was demand ischemia and they did not suggest any further workup at this time.  4. Stage III chronic kidney disease, stable and we continued monitoring while she was on Lasix.  5. Anxiety. Continued clonazepam and added Ativan. We also called psychiatry for recurrent anxiety episodes. They added risperidone and Xanax.  6. Hyperglycemia. The patient was on insulin sliding scale.  7. Venostasis with peripheral edema. This was due to CHF.  8. Urinary tract infection. We started on Rocephin and continued.  9. Chronic respiratory failure due to CHF. Qualified for  home oxygen. We arranged for oxygen at home on discharge.   CONSULTS IN HOSPITAL: Cardiology consult with Dr. Gwen PoundsKowalski.   IMPORTANT LABORATORY RESULTS: On presentation WBC 9.4, hemoglobin 13.5, platelet count 144,000, MCV 103. Troponin 0.04. Glucose 187, creatinine 1.98, sodium 137, potassium 3.8, chloride 98, and CO2 of 28.   Chest x-ray PA and lateral showed cardiomegaly, central mild vascular congestion and mild perihilar interstitial prominence.   Troponin 0.03. On further followup, creatine was 1.97, sodium 139, and potassium 3.2.   Echocardiogram was done, which showed ejection fraction 50% to 55%, normal global left ventricular systolic function, severe mitral valve regurgitation, moderately elevated pulmonary artery pressure.   Creatinine was stable at 1.9 on further followup. Urinalysis was negative.   Chest x-ray on 12/01/2014 showed stable cardiomegaly with central pulmonary vascular congestion bilateral had edema, mild basilar opacity.  TOTAL TIME SPENT ON THIS DISCHARGE: 35 minutes.    ____________________________ Maureen PigeonVaibhavkumar G. Maureen PigeonVachhani, MD vgv:ah D: 12/05/2014 20:35:20 ET T: 12/06/2014 07:10:20 ET JOB#: [id number]  cc: Maureen PigeonVaibhavkumar G. Maureen PigeonVachhani, MD, <Dictator> Maureen Bucksenny C. Maureen Pouchate, MD Maureen BlinksBruce J. Kowalski, MD Altamese DillingVAIBHAVKUMAR Maureen Betsill MD ELECTRONICALLY SIGNED 12/20/2014 12:54

## 2015-02-06 NOTE — Consult Note (Signed)
PATIENT NAME:  Maureen Wilson, Maureen Wilson MR#:  [id number] DATE OF BIRTH:  09/03/1941  DATE OF CONSULTATION:  11/29/2014  CONSULTING PHYSICIAN:  Shaune Pollack, M.D.  REASON FOR CONSULTATION: Acute on chronic systolic dysfunction, congestive heart failure, chronic atrial fibrillation nonvalvular with chronic kidney disease, sleep apnea, and previous coronary artery disease.   CHIEF COMPLAINT: "I am short of breath."   HISTORY OF PRESENT ILLNESS: This is a 74 year old female with known chronic atrial fibrillation with exacerbation with rapid ventricular rate, multifactorial in nature including sleep apnea and chronic kidney disease now with acute on chronic systolic dysfunction, congestive heart failure with apparent previous LV systolic dysfunction on appropriate medications including beta blocker, Lipitor and Demadex with pulmonary edema, hypoxia, shortness of breath, weakness and fatigue increasing over the last several days with some lower extremity edema. The patient also has had improvements with his oxygen and intravenous Lasix. The patient has had a normal troponin and an EKG showing atrial fibrillation with rapid ventricular rate and nonspecific ST changes but no evidence of myocardial infarction.   REVIEW OF SYSTEMS: The remainder of review of systems negative for vision change, ringing in the ears, hearing loss, heartburn, nausea, vomiting, diarrhea, bloody stools, stomach pain, extremity pain, leg weakness, cramping of the buttocks, known blood clots, headaches, blackouts, dizzy spells, nosebleeds, congestion, trouble swallowing, frequent urination, urination at night, muscle weakness, numbness, anxiety and depression, skin lesions, or skin rashes.   PAST MEDICAL HISTORY:  1.  Chronic systolic dysfunction heart failure.  2.  Chronic atrial fibrillation, nonvalvular. 3.  Chronic kidney disease stage IV. 4.  Sleep apnea.  5.  Coronary artery disease with previous PCI and stent placement.   FAMILY  HISTORY: Multiple family members with hypertension and some with coronary artery disease.   SOCIAL HISTORY: Currently denies alcohol or tobacco use.   ALLERGIES: As listed.   MEDICATIONS: As listed.   PHYSICAL EXAMINATION:  VITAL SIGNS: Blood pressure is 122/68 bilaterally, heart rate is 96.  She is upright, reclining, and irregular.  GENERAL: She is a well-appearing elderly female in no acute distress.  HEENT: No icterus, thyromegaly, ulcers, hemorrhage, or xanthelasma.  CARDIOVASCULAR: Irregularly irregular with normal S1 and S2, 2/6 apical murmur consistent with mitral regurgitation. PMI is inferiorly displaced. Carotid upstroke normal without bruit. Jugular venous pressure not seen.  LUNGS: Have bibasilar crackles with normal respirations.  ABDOMEN: Soft, nontender, without hepatosplenomegaly or masses. Abdominal aorta is normal size without bruit.  EXTREMITIES: 2+ radial, femoral, dorsal pedal pulses, with trace to 1+ lower extremity edema. No cyanosis, clubbing, or ulcers.  NEUROLOGIC: She is oriented to time, place, and person, with normal mood and affect.   ASSESSMENT: This is a 74 year old female with acute on chronic systolic dysfunction, congestive heart failure, chronic nonvalvular atrial fibrillation, chronic kidney disease stage IV, sleep apnea, coronary artery disease, status post PCI and stent placement in the past without evidence of myocardial infarction, needing further treatment options.   RECOMMENDATIONS:  1.  Continue intravenous Lasix and then change back over to oral Lasix as able watching for worsening chronic kidney disease and improvements of heart failure. 2.  Continue atenolol for heart rate control of atrial fibrillation.  3.  Anticoagulation with Coumadin for further risk reduction in stroke with a goal INR between 2-3. 4.  Further serial ECG and enzymes to assess for possible myocardial infarction but no further cardiac intervention at this time unless other  changes and symptoms occur.  5.  Continue oxygenation and CPAP machine and  sleep apnea treatment which may have been exacerbated above.  6.  Lipitor for high intensity cholesterol therapy.  7.  Begin ambulation and follow for adjustments of medications.   ____________________________ Maureen BlinksBruce J. Kowalski, MD bjk:mc D: 11/29/2014 08:16:10 ET T: 11/29/2014 08:41:01 ET JOB#: [id number]  cc: Maureen BlinksBruce J. Kowalski, MD, <Dictator> Maureen BlinksBRUCE J KOWALSKI MD ELECTRONICALLY SIGNED 12/03/2014 8:31

## 2015-02-06 NOTE — H&P (Signed)
PATIENT NAME:  Maureen Wilson, Maureen Wilson MR#:  [id number] DATE OF BIRTH:  [birth year]  DATE OF ADMISSION:  11/28/2014  REFERRING PHYSICIAN: Caryn BeeKevin A. Paduchowski, MD   FAMILY PHYSICIAN:  Jillene BucksDenny C. Arlana Pouchate, MD  REASON FOR ADMISSION:  Rapid atrial fibrillation with CHF.   HISTORY OF PRESENT ILLNESS: The patient is a 74 year old female with a history of chronic atrial fibrillation, on anticoagulation. Also has obstructive sleep apnea, chronic COPD and asthma, and history of congestive heart failure. Presents to the Emergency Room with chest pain and worsening shortness of breath with profound weakness. In the Emergency Room, the patient was noted to be hypoxic with rapid atrial fibrillation. Chest x-ray suggests worsening heart failure. She continues to have chest pain and shortness of breath and is now admitted for further evaluation.   PAST MEDICAL HISTORY: 1.  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  2.  Ischemic cardiomyopathy.  3.  Chronic atrial fibrillation.  4.  Chronic systolic congestive heart failure.  5.  Obstructive sleep apnea, on CPAP.  6.  Obesity.  7.  Osteoporosis.  8.  Osteoarthritis.  9.  Anxiety and depression.  10.  Hyperlipidemia.  11.  GE reflux disease.  12.  Benign hypertension.  13.  Remote history of stroke.  14.  Status post right knee surgery.   MEDICATIONS: 1.  Ambien 10 mg p.o. at bedtime.  2.  Coumadin 2 mg p.o. q. p.m.  3.  Omeprazole 40 mg p.o. daily.  4.  Meloxicam 15 mg p.o. daily.  5.  Lipitor 20 mg p.o. at bedtime.  6.  Demadex 60 mg p.o. daily.  7.  Synthroid 50 mcg p.o. daily.  8.  Klonopin 0.5 mg p.o. b.i.d.  9.  Multivitamin 1 p.o. daily.  10.  Atenolol 100 mg p.o. b.i.d.  11.  Aspirin 81 mg p.o. daily.   ALLERGIES: HYDROCHLOROTHIAZIDE, MORPHINE, SULFA, ALTACE, HYDRALAZINE, METOPROLOL, BENICAR, MICARDIS AND NORVASC.   SOCIAL HISTORY: Negative for alcohol or tobacco abuse.   FAMILY HISTORY: Positive for coronary artery disease, diabetes, and  hypertension.   REVIEW OF SYSTEMS:   CONSTITUTIONAL: No fever or change in weight.  EYES: No blurred or double vision. No glaucoma.  EARS, NOSE, THROAT: No tinnitus or hearing loss. No nasal discharge or bleeding. No difficulty swallowing.  RESPIRATORY: No cough or wheezing. Denies hemoptysis.  CARDIOVASCULAR: Has had chest pain and orthopnea. No palpitations. No syncope.  GASTROINTESTINAL: Some nausea, but no vomiting or diarrhea. No abdominal pain or change in bowel habits.  GENITOURINARY: No dysuria, hematuria, or incontinence.  ENDOCRINE: No polyuria or polydipsia. No heat or cold intolerance.  HEMATOLOGIC: The patient denies anemia, easy bruising, or bleeding.  LYMPHATIC: No swollen glands.  MUSCULOSKELETAL: The patient is having some back pain, but denies neck, shoulder, knee, or hip pain.  NEUROLOGIC: No numbness or migraines. Denies seizures.  PSYCHIATRIC: The patient denies anxiety, insomnia, or depression.   PHYSICAL EXAMINATION: GENERAL: The patient is in no acute distress.  VITAL SIGNS: Remarkable for a blood pressure of 111/45, heart rate 130, respiratory rate 26, temperature 98.1, saturation 98% on 2 liters of oxygen.  HEENT: Normocephalic, atraumatic. Pupils equal, round, and reactive to light and accommodation. Extraocular movements are intact. Sclerae nonicteric. Conjunctivae are clear. Oropharynx clear.  NECK: Supple without JVD. No adenopathy or thyromegaly is noted.  LUNGS: Revealed basilar crackles bilaterally.  No wheezes or rhonchi. No dullness. Respiratory effort is mildly increased.  CARDIAC: Irregularly irregular rhythm with a rapid rate. No significant rubs or  gallops. PMI is nondisplaced. Chest wall is nontender.  ABDOMEN: Soft, nontender, with normoactive bowel sounds. No organomegaly or masses were appreciated. No hernias or bruits were noted.  EXTREMITIES: Revealed 1+ edema with stasis changes. Pulses were 2+ bilaterally.  SKIN: Warm and dry without rash or  lesions.  NEUROLOGIC: Revealed cranial nerves II-XII grossly intact. Deep tendon reflexes were symmetric. Motor and sensory examination is nonfocal.  PSYCHIATRIC: Revealed a patient who is alert and oriented to person, place, and time. She was cooperative and used good judgment.   EKG revealed atrial fibrillation at a rate of 118 beats per minute with no acute ischemic changes. Chest x-ray revealed diffuse pulmonary edema.   LABORATORY DATA: Her initial troponin was 0.04. Glucose 187 with a BUN of 30, creatinine 1.98 and a GFR of 26. White count was 9.4 with a hemoglobin of 13.5. Her pro time was 23.8 with an INR of 2.1.   ASSESSMENT: 1.  Acute on chronic systolic congestive heart failure.  2.  Rapid atrial fibrillation.  3.  Chest pain.  4.  Stage 3 chronic kidney disease.  5.  Anxiety.  6.  Hyperglycemia.  7.  Venous stasis with peripheral edema.   PLAN: The patient will be admitted to telemetry with IV Lasix and the addition of p.o. diltiazem. We will follow her heart rate closely and follow serial cardiac enzymes. Will wean oxygen as tolerated. Will consult cardiology for her atrial fibrillation with RVR and CHF. We will also consult pulmonology at the patient's request and she was scheduled to see Dr. Meredeth Ide tomorrow as an outpatient. We will follow her sugars with Accu-Cheks and add sliding scale insulin as needed. Follow up routine labs and chest x-ray in the morning. Consult physical therapy and care management. Further treatment and evaluation will depend upon the patient's progress.   TOTAL TIME SPENT ON THIS PATIENT: 50 minutes.    ____________________________ Duane Lope Judithann Sheen, MD jds:LT D: 11/28/2014 17:16:39 ET T: 11/28/2014 17:52:20 ET JOB#: [id number]  cc: Duane Lope. Judithann Sheen, MD, <Dictator> Jillene Bucks. Arlana Pouch, MD Biridiana Twardowski Rodena Medin MD ELECTRONICALLY SIGNED 11/28/2014 20:26

## 2015-05-25 ENCOUNTER — Ambulatory Visit: Payer: Medicare Other

## 2015-09-17 IMAGING — CR DG HIP COMPLETE 2+V*L*
1 series · 3 of 3 positions shown · non-contrast
Comparison: 06/09/2011, 11/01/2010

CLINICAL DATA: LEFT hip pain and tenderness after falling today,
recent weakness

EXAM:
LEFT HIP (WITH PELVIS) 2-3 VIEWS

[Series 1: dxr hip left complete · 0.14mm/px · 3 of 3 slices shown]
[im 1/3]
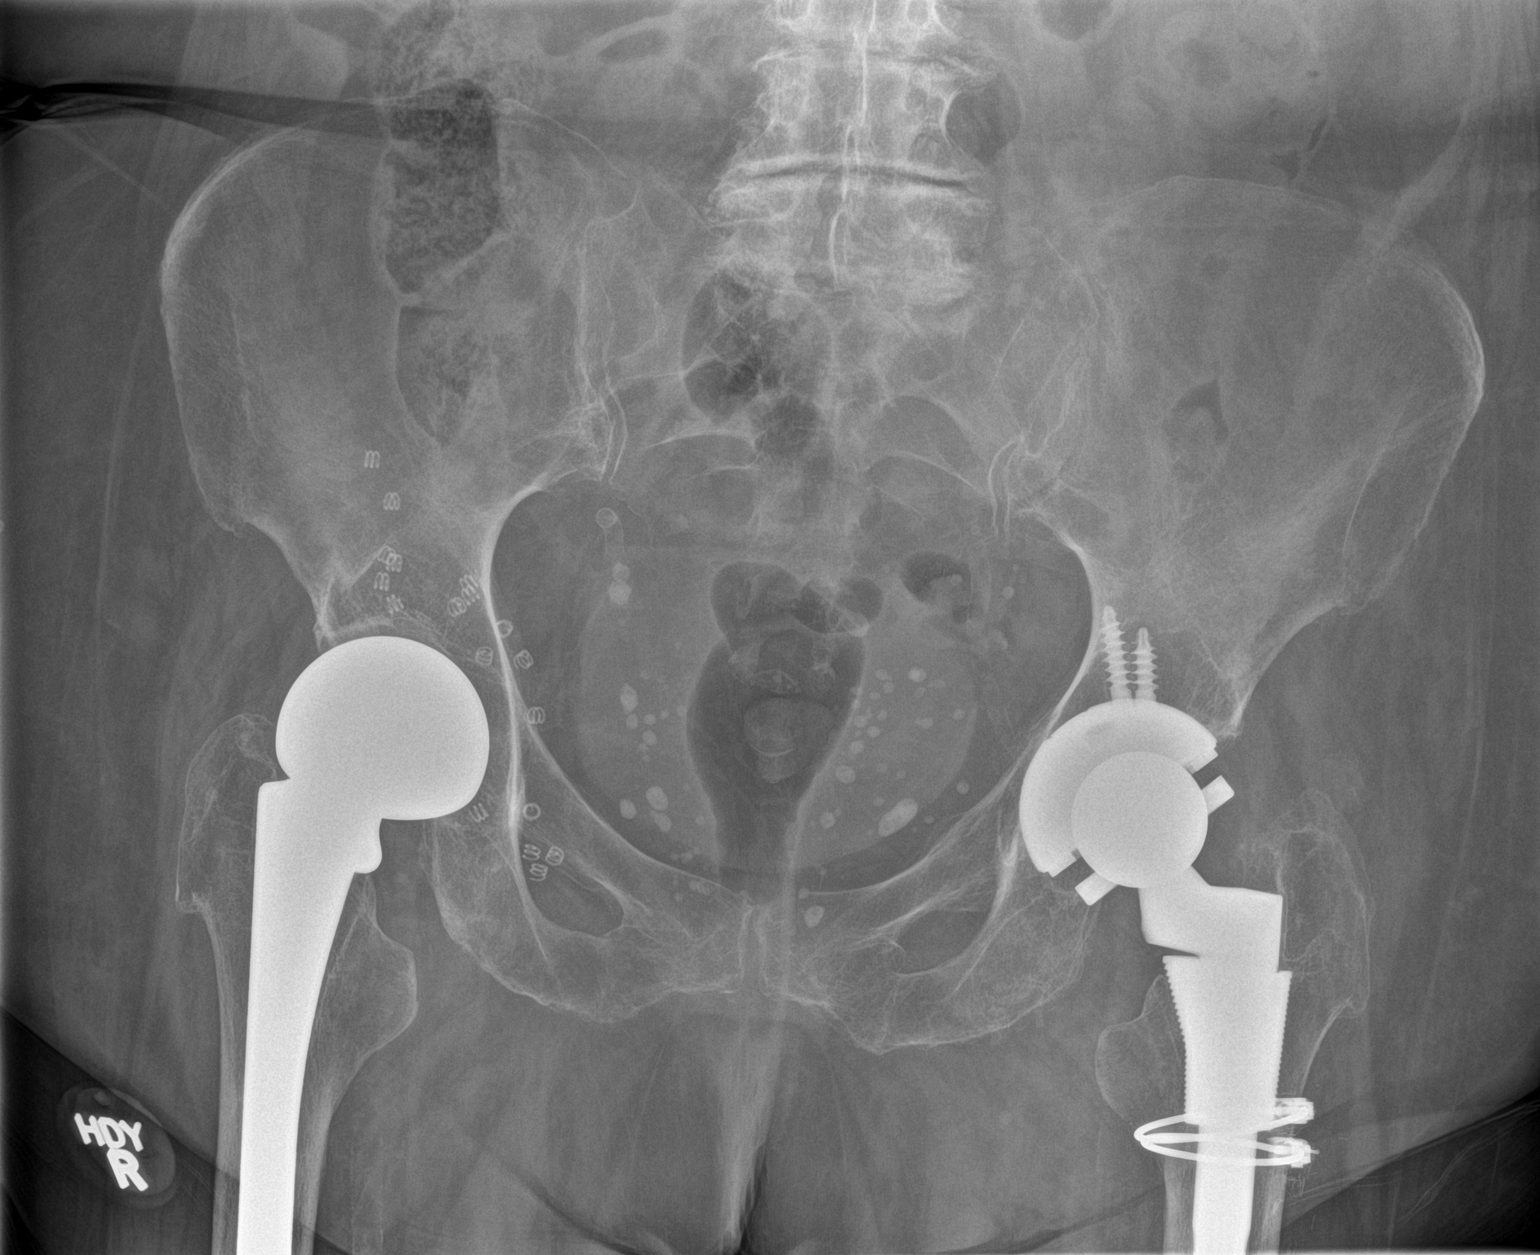
[im 2/3]
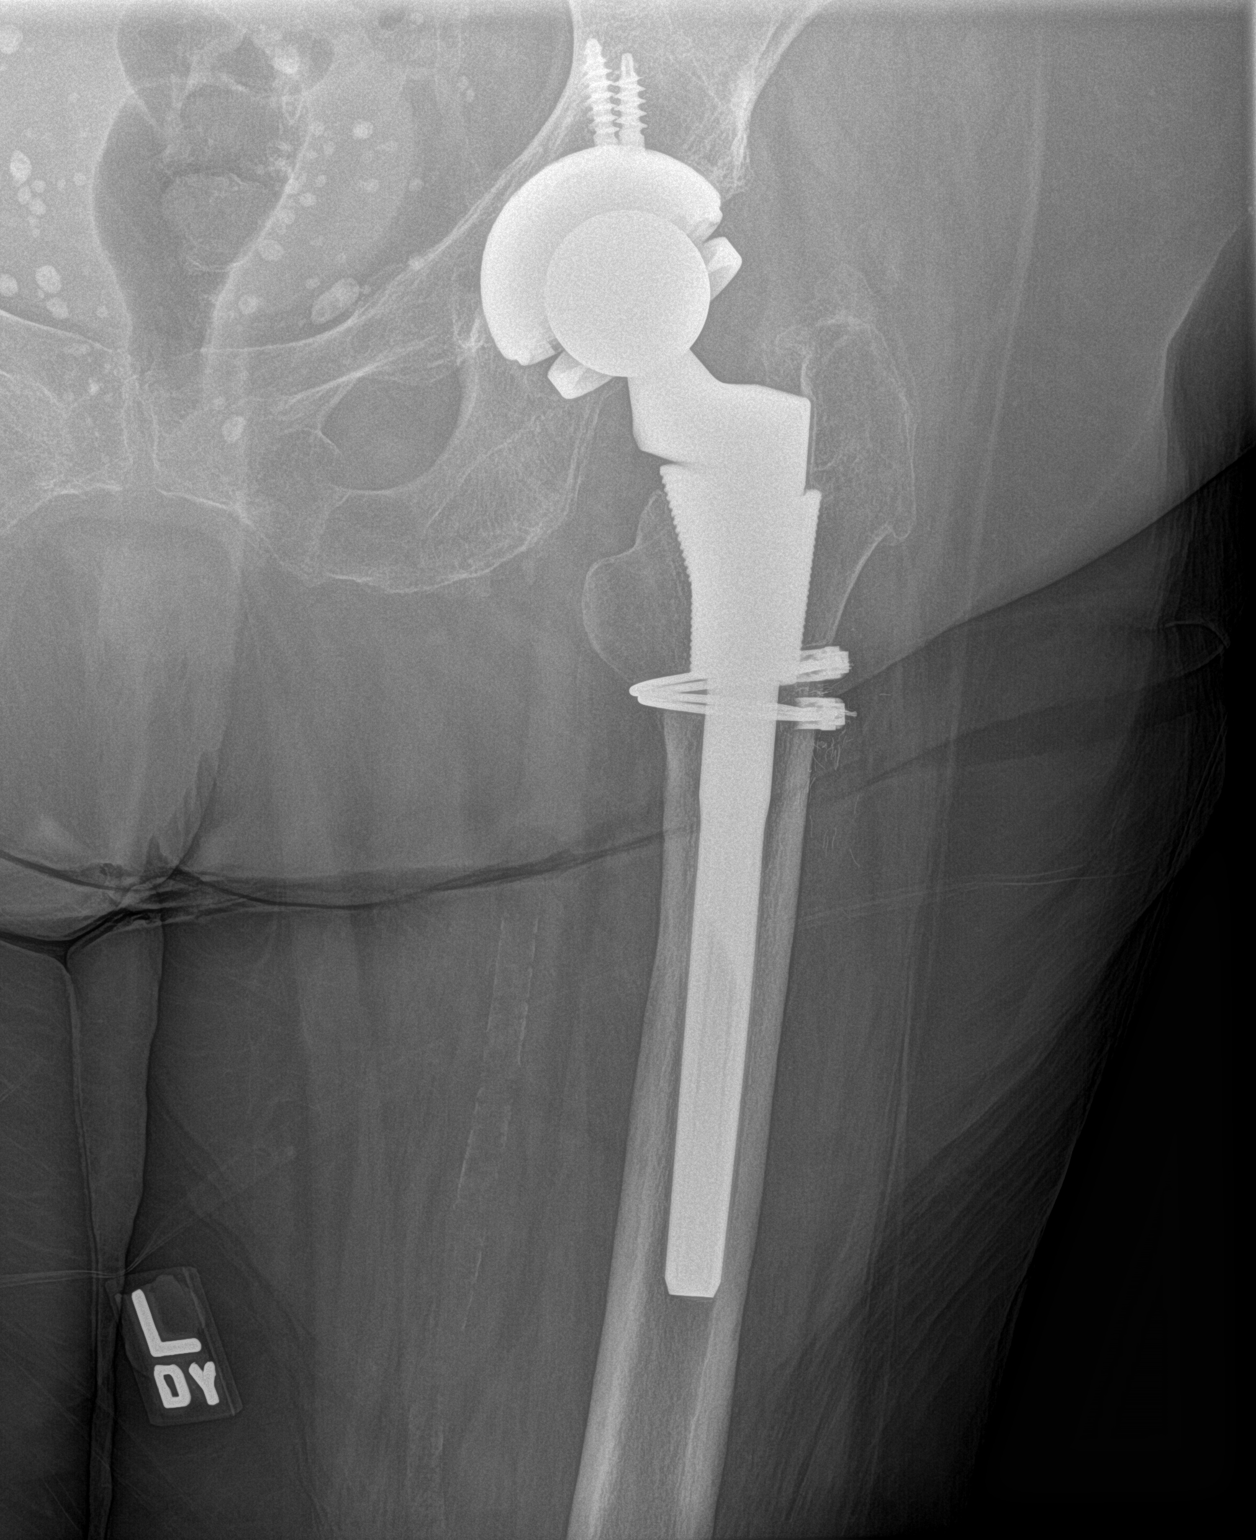
[im 3/3]
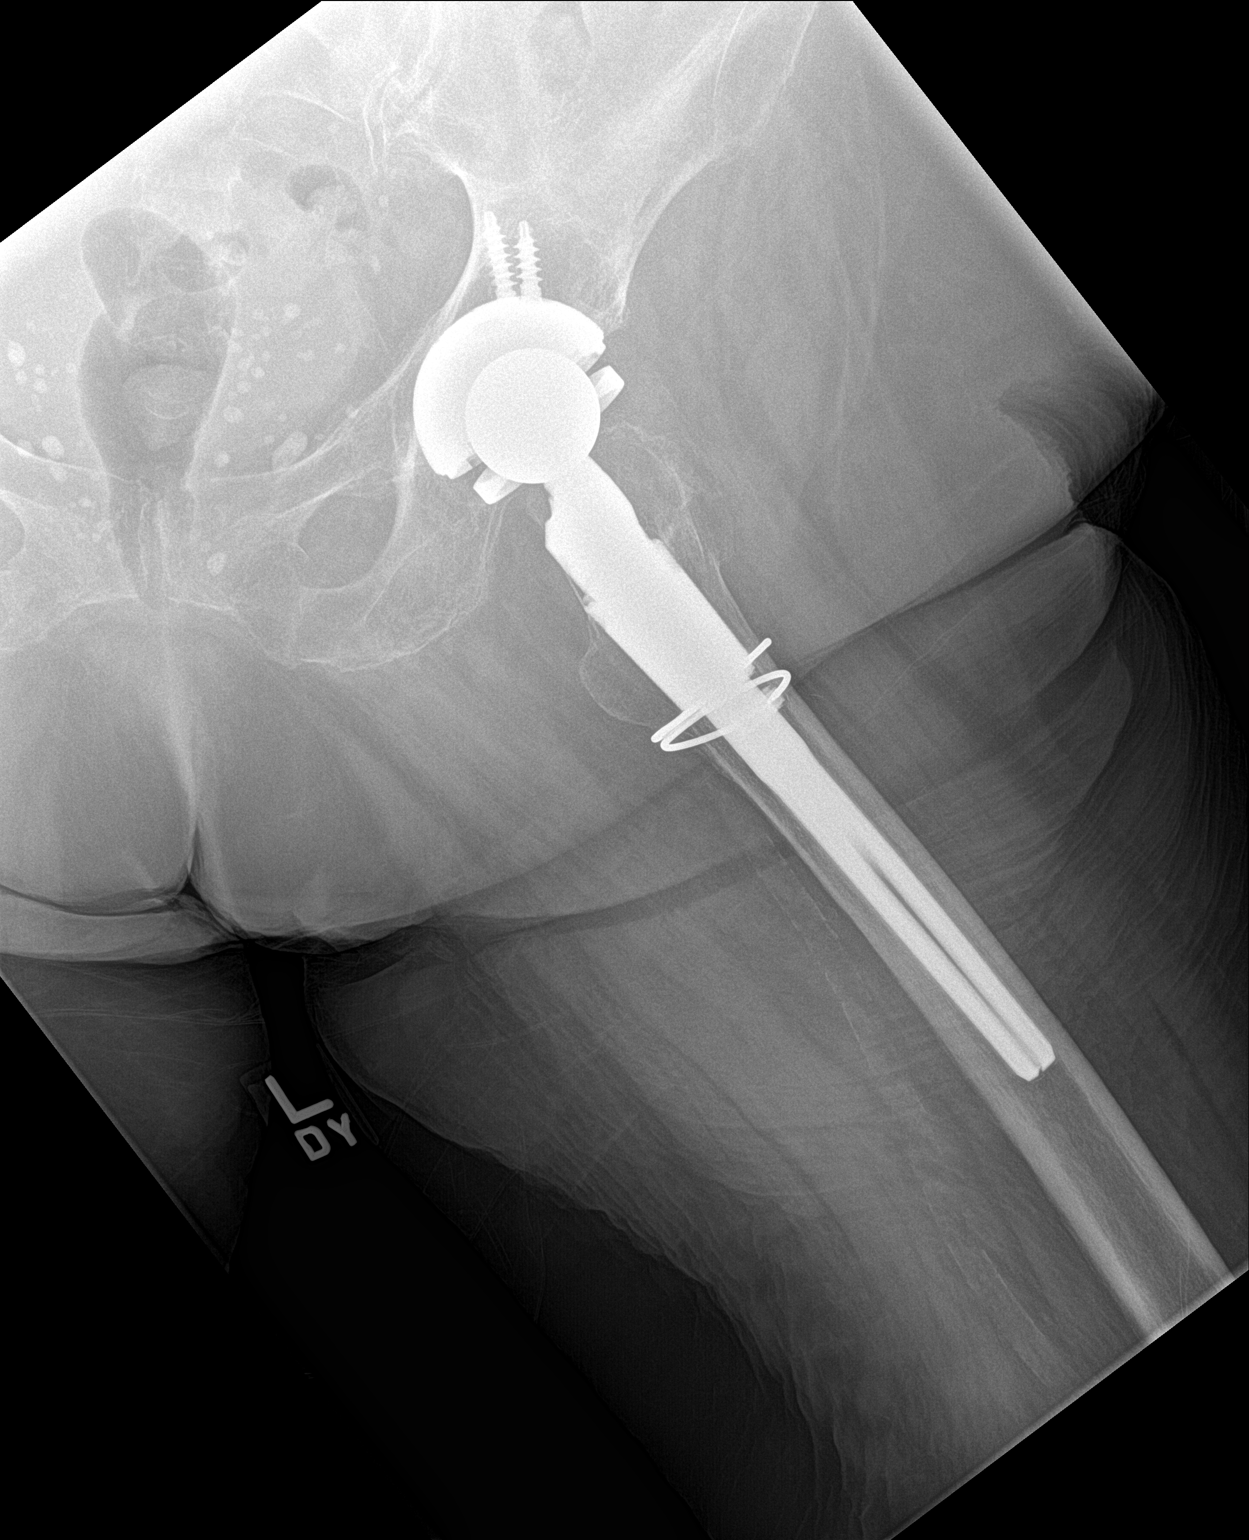

[3 of 3 positions shown; findings below may reference images not displayed]

FINDINGS: BILATERAL hip prostheses.

Hardware appears intact without periprosthetic lucency.

Diffuse osseous demineralization.

Probable prior RIGHT inguinal hernia repair.

SI joints symmetric.

Degenerative disc and facet disease changes at visualized lower
lumbar spine.

No acute fracture, dislocation or bone destruction.

Old healed fractures of the LEFT superior and inferior pubic rami.

Numerous pelvic phleboliths and scattered atherosclerotic
calcifications.
IMPRESSION: BILATERAL hip prostheses and severe osseous demineralization

Old healed fractures of the LEFT superior and inferior pubic rami.

No acute bony abnormalities.

## 2015-09-17 IMAGING — CT CT HEAD WITHOUT CONTRAST
1 series · 15 of 30 positions shown, 19 images · non-contrast
Comparison: Head CT 11/02/2013.

CLINICAL DATA: 73-year-old female with history of 2 falls earlier
today and generalized weakness. No head injury with either of these
false.

EXAM:
CT HEAD WITHOUT CONTRAST
TECHNIQUE: Contiguous axial images were obtained from the base of the skull
through the vertex without intravenous contrast.

[Series 2: head wo · axial · 0.41mm/px · z∈[-282,-156]mm · 15 of 32 slices shown, 19 images]
[im 2/32  brain]
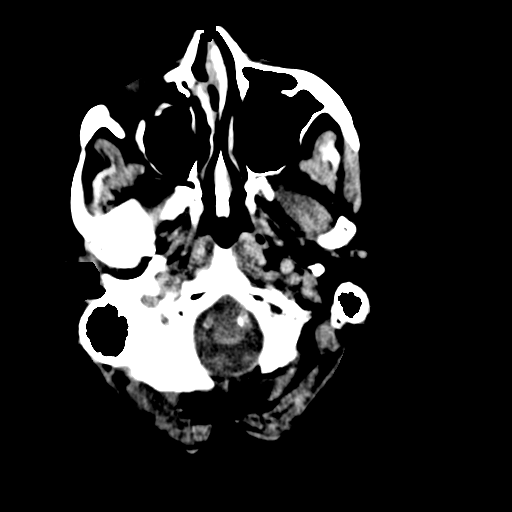
[im 2/32  bone]
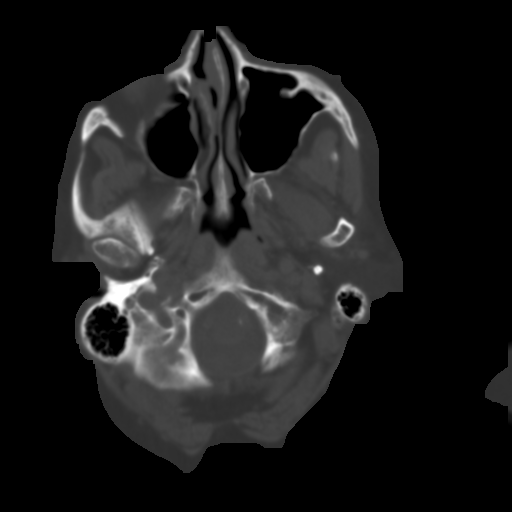
[im 4/32  brain]
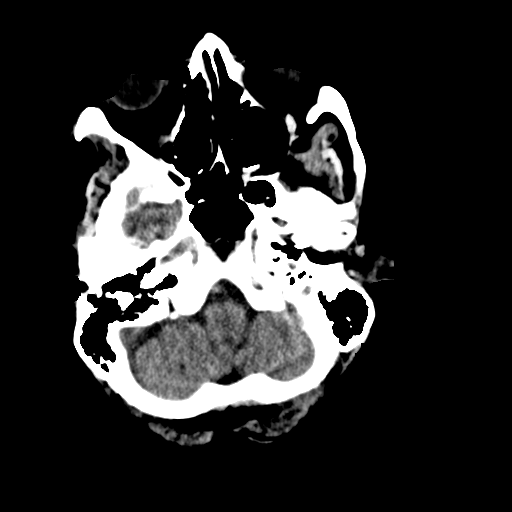
[im 6/32  brain]
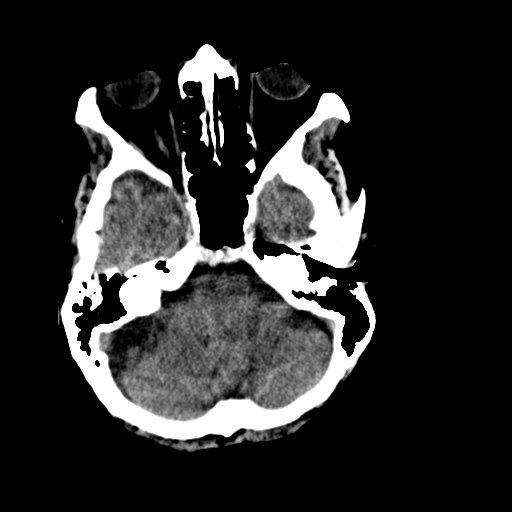
[im 8/32  brain]
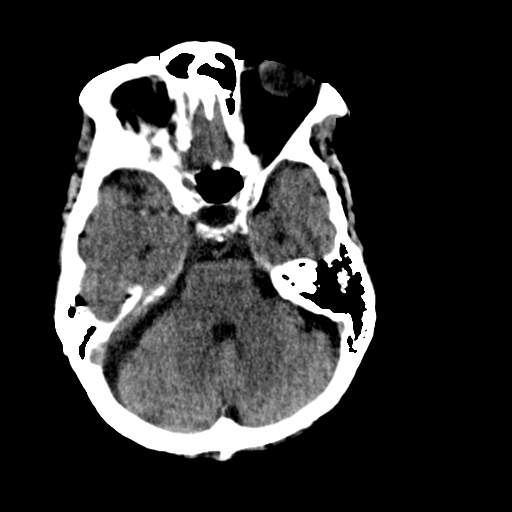
[im 10/32  brain]
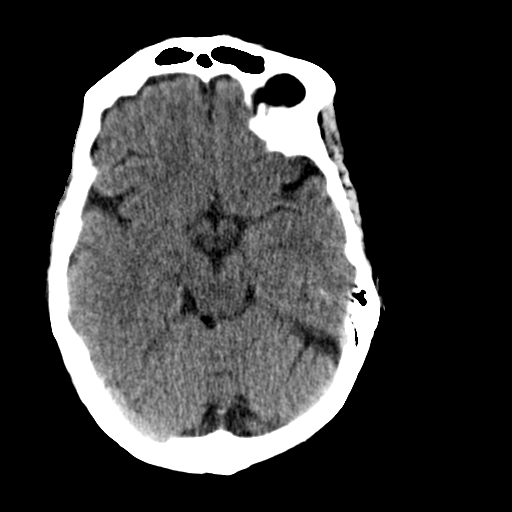
[im 10/32  bone]
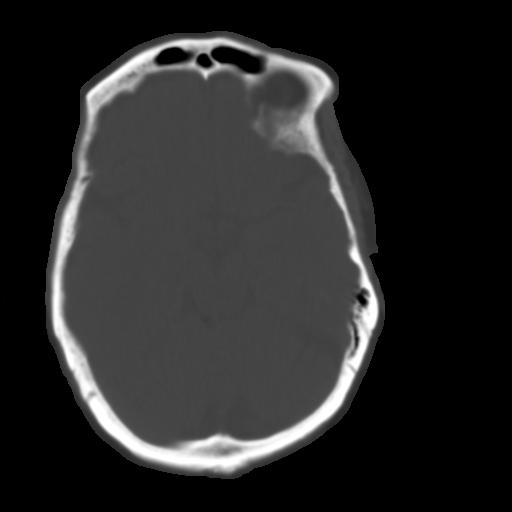
[im 12/32  brain]
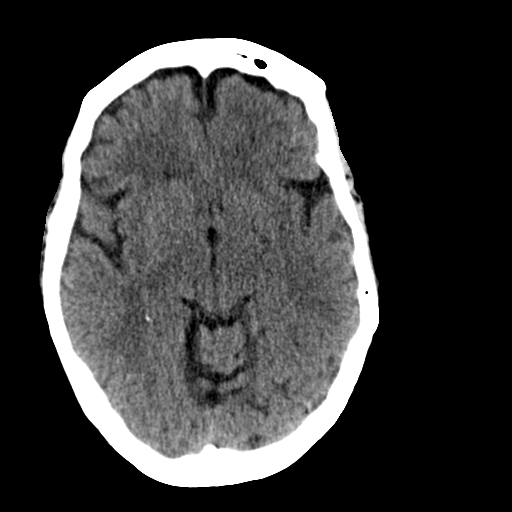
[im 14/32  brain]
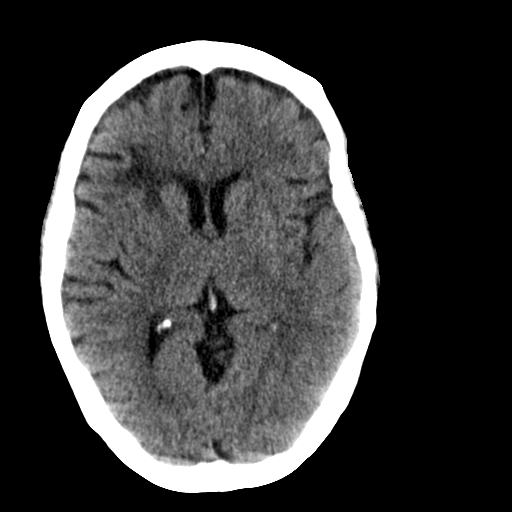
[im 17/32  brain]
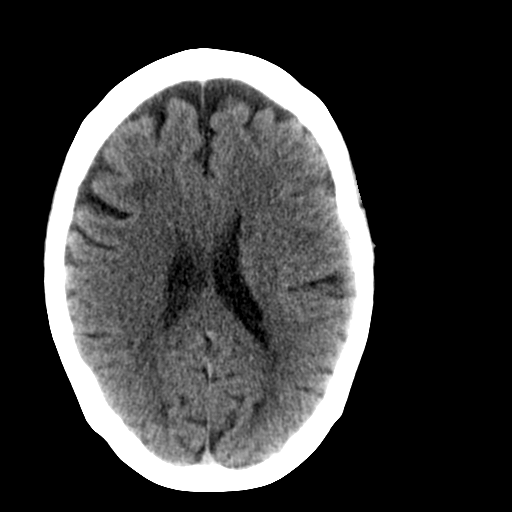
[im 18/32  brain]
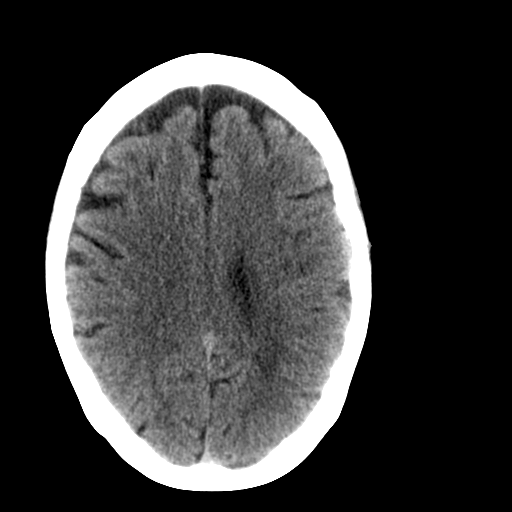
[im 18/32  bone]
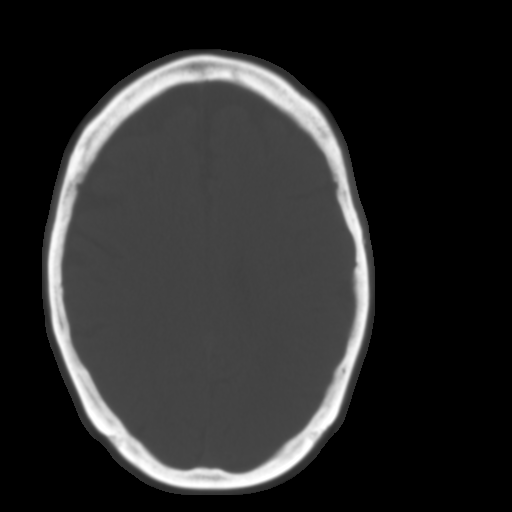
[im 20/32  brain]
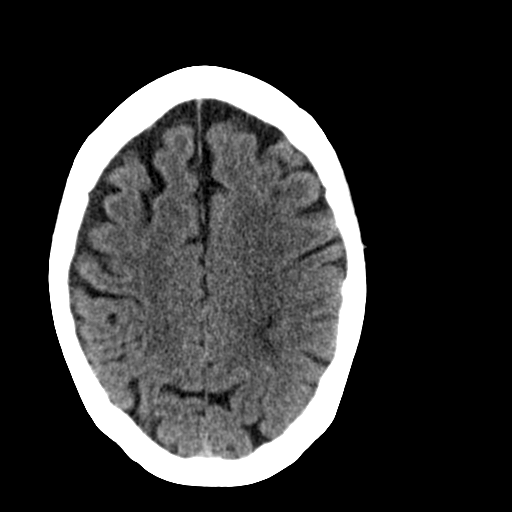
[im 22/32  brain]
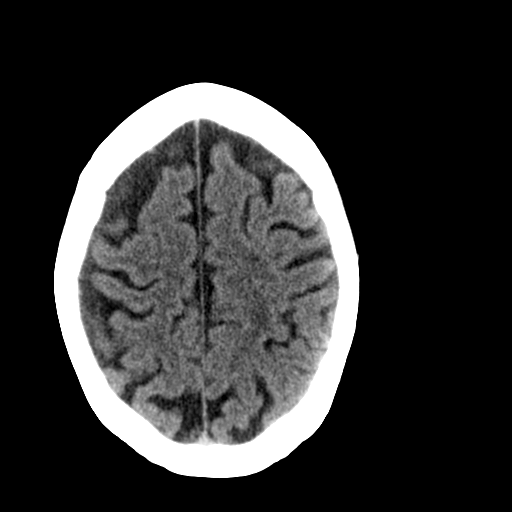
[im 24/32  brain]
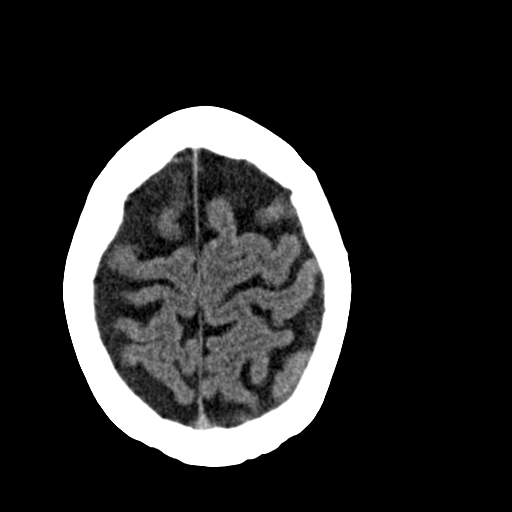
[im 26/32  brain]
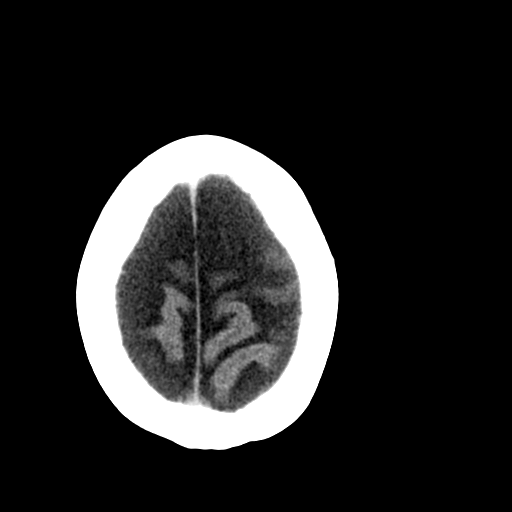
[im 26/32  bone]
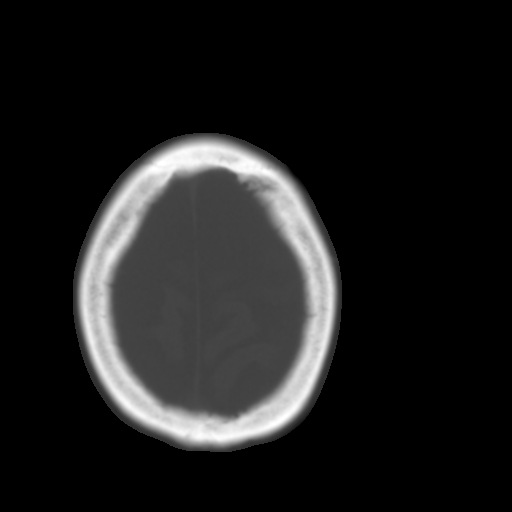
[im 28/32  brain]
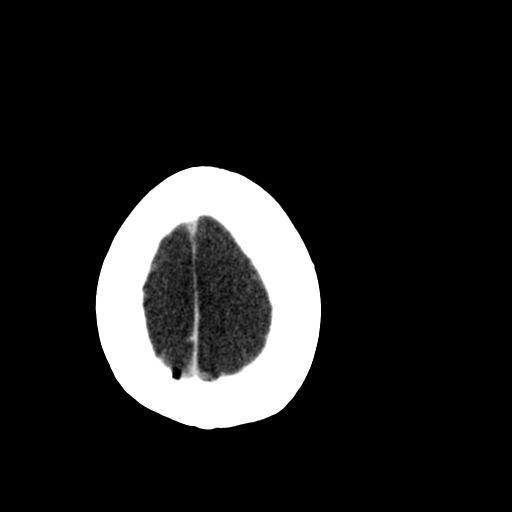
[im 30/32  brain]
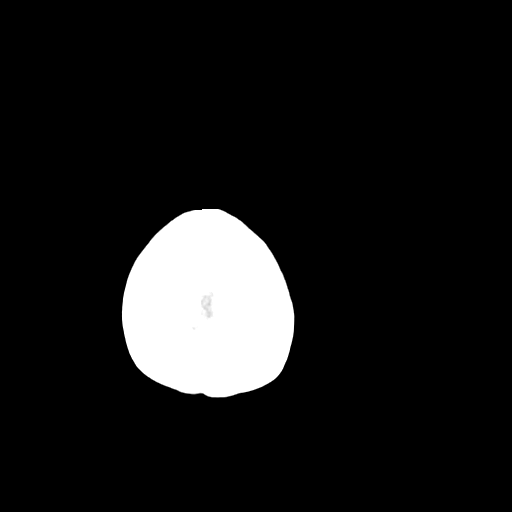

[15 of 30 positions shown; findings below may reference images not displayed]

FINDINGS: Mild cerebral atrophy. Ill-defined area of low attenuation in the
right frontal lobe white matter, similar to prior examination
11/02/2013, compatible with encephalomalacia from old infarction.
Patchy areas of decreased attenuation are noted throughout the deep
and periventricular white matter of the cerebral hemispheres
bilaterally, compatible with mild chronic microvascular ischemic
disease. No acute intracranial abnormalities. Specifically, no
evidence of acute intracranial hemorrhage, no definite findings of
acute/subacute cerebral ischemia, no mass, mass effect,
hydrocephalus or abnormal intra or extra-axial fluid collections.
Visualized paranasal sinuses and mastoids are well pneumatized. No
acute displaced skull fractures are identified.
IMPRESSION: 1. No acute intracranial abnormalities.
2. Mild cerebral atrophy with mild chronic microvascular ischemic
changes in the cerebral white matter and encephalomalacia in the
right frontal lobe white matter, similar to the prior examination.

## 2015-09-29 ENCOUNTER — Emergency Department: Payer: Medicare Other

## 2015-09-29 ENCOUNTER — Inpatient Hospital Stay
Admission: EM | Admit: 2015-09-29 | Discharge: 2015-10-05 | DRG: 555 | Disposition: A | Discharge status: Skilled Nursing Facility | Payer: Medicare Other | Attending: Internal Medicine | Admitting: Internal Medicine

## 2015-09-29 ENCOUNTER — Encounter: Payer: Self-pay | Admitting: Intensive Care

## 2015-09-29 DIAGNOSIS — I340 Nonrheumatic mitral (valve) insufficiency: Secondary | ICD-10-CM | POA: Diagnosis present

## 2015-09-29 DIAGNOSIS — W19XXXA Unspecified fall, initial encounter: Secondary | ICD-10-CM | POA: Diagnosis present

## 2015-09-29 DIAGNOSIS — W010XXA Fall on same level from slipping, tripping and stumbling without subsequent striking against object, initial encounter: Secondary | ICD-10-CM | POA: Diagnosis present

## 2015-09-29 DIAGNOSIS — Z90710 Acquired absence of both cervix and uterus: Secondary | ICD-10-CM

## 2015-09-29 DIAGNOSIS — R791 Abnormal coagulation profile: Secondary | ICD-10-CM | POA: Diagnosis present

## 2015-09-29 DIAGNOSIS — K5900 Constipation, unspecified: Secondary | ICD-10-CM | POA: Diagnosis present

## 2015-09-29 DIAGNOSIS — Z79899 Other long term (current) drug therapy: Secondary | ICD-10-CM

## 2015-09-29 DIAGNOSIS — Z7901 Long term (current) use of anticoagulants: Secondary | ICD-10-CM

## 2015-09-29 DIAGNOSIS — Z96643 Presence of artificial hip joint, bilateral: Secondary | ICD-10-CM | POA: Diagnosis present

## 2015-09-29 DIAGNOSIS — I130 Hypertensive heart and chronic kidney disease with heart failure and stage 1 through stage 4 chronic kidney disease, or unspecified chronic kidney disease: Secondary | ICD-10-CM | POA: Diagnosis present

## 2015-09-29 DIAGNOSIS — T17908A Unspecified foreign body in respiratory tract, part unspecified causing other injury, initial encounter: Secondary | ICD-10-CM

## 2015-09-29 DIAGNOSIS — J449 Chronic obstructive pulmonary disease, unspecified: Secondary | ICD-10-CM | POA: Diagnosis present

## 2015-09-29 DIAGNOSIS — Z7951 Long term (current) use of inhaled steroids: Secondary | ICD-10-CM

## 2015-09-29 DIAGNOSIS — R262 Difficulty in walking, not elsewhere classified: Secondary | ICD-10-CM | POA: Diagnosis not present

## 2015-09-29 DIAGNOSIS — R52 Pain, unspecified: Secondary | ICD-10-CM

## 2015-09-29 DIAGNOSIS — Y92000 Kitchen of unspecified non-institutional (private) residence as  the place of occurrence of the external cause: Secondary | ICD-10-CM

## 2015-09-29 DIAGNOSIS — M810 Age-related osteoporosis without current pathological fracture: Secondary | ICD-10-CM | POA: Diagnosis present

## 2015-09-29 DIAGNOSIS — I482 Chronic atrial fibrillation: Secondary | ICD-10-CM | POA: Diagnosis present

## 2015-09-29 DIAGNOSIS — M791 Myalgia: Secondary | ICD-10-CM | POA: Diagnosis not present

## 2015-09-29 DIAGNOSIS — E876 Hypokalemia: Secondary | ICD-10-CM | POA: Diagnosis present

## 2015-09-29 DIAGNOSIS — Z888 Allergy status to other drugs, medicaments and biological substances status: Secondary | ICD-10-CM

## 2015-09-29 DIAGNOSIS — R2681 Unsteadiness on feet: Secondary | ICD-10-CM | POA: Diagnosis present

## 2015-09-29 DIAGNOSIS — N189 Chronic kidney disease, unspecified: Secondary | ICD-10-CM | POA: Diagnosis present

## 2015-09-29 DIAGNOSIS — Z8249 Family history of ischemic heart disease and other diseases of the circulatory system: Secondary | ICD-10-CM

## 2015-09-29 DIAGNOSIS — R410 Disorientation, unspecified: Secondary | ICD-10-CM | POA: Diagnosis present

## 2015-09-29 DIAGNOSIS — M25551 Pain in right hip: Secondary | ICD-10-CM | POA: Diagnosis present

## 2015-09-29 DIAGNOSIS — N179 Acute kidney failure, unspecified: Secondary | ICD-10-CM | POA: Diagnosis present

## 2015-09-29 DIAGNOSIS — Z882 Allergy status to sulfonamides status: Secondary | ICD-10-CM

## 2015-09-29 DIAGNOSIS — I5022 Chronic systolic (congestive) heart failure: Secondary | ICD-10-CM | POA: Diagnosis present

## 2015-09-29 DIAGNOSIS — E871 Hypo-osmolality and hyponatremia: Secondary | ICD-10-CM | POA: Diagnosis present

## 2015-09-29 DIAGNOSIS — M7918 Myalgia, other site: Secondary | ICD-10-CM

## 2015-09-29 DIAGNOSIS — R0681 Apnea, not elsewhere classified: Secondary | ICD-10-CM | POA: Diagnosis present

## 2015-09-29 DIAGNOSIS — G4733 Obstructive sleep apnea (adult) (pediatric): Secondary | ICD-10-CM | POA: Diagnosis present

## 2015-09-29 DIAGNOSIS — J690 Pneumonitis due to inhalation of food and vomit: Secondary | ICD-10-CM | POA: Diagnosis present

## 2015-09-29 DIAGNOSIS — Z9981 Dependence on supplemental oxygen: Secondary | ICD-10-CM

## 2015-09-29 HISTORY — DX: Essential (primary) hypertension: I10

## 2015-09-29 HISTORY — DX: Heart failure, unspecified: I50.9

## 2015-09-29 HISTORY — DX: Unspecified atrial fibrillation: I48.91

## 2015-09-29 HISTORY — DX: Chronic obstructive pulmonary disease, unspecified: J44.9

## 2015-09-29 HISTORY — DX: Chronic kidney disease, unspecified: N18.9

## 2015-09-29 LAB — COMPREHENSIVE METABOLIC PANEL
ALT: 26 U/L (ref 14–54)
ANION GAP: 13 (ref 5–15)
AST: 39 U/L (ref 15–41)
Albumin: 4.5 g/dL (ref 3.5–5.0)
Alkaline Phosphatase: 108 U/L (ref 38–126)
BUN: 27 mg/dL — ABNORMAL HIGH (ref 6–20)
CO2: 32 mmol/L (ref 22–32)
Calcium: 9.2 mg/dL (ref 8.9–10.3)
Chloride: 85 mmol/L — ABNORMAL LOW (ref 101–111)
Creatinine, Ser: 2.15 mg/dL — ABNORMAL HIGH (ref 0.44–1.00)
GFR calc non Af Amer: 21 mL/min — ABNORMAL LOW (ref >60)
GFR, EST AFRICAN AMERICAN: 25 mL/min — AB (ref >60)
Glucose, Bld: 134 mg/dL — ABNORMAL HIGH (ref 65–99)
POTASSIUM: 3.8 mmol/L (ref 3.5–5.1)
Sodium: 130 mmol/L — ABNORMAL LOW (ref 135–145)
Total Bilirubin: 1.5 mg/dL — ABNORMAL HIGH (ref 0.3–1.2)
Total Protein: 8 g/dL (ref 6.5–8.1)

## 2015-09-29 LAB — CBC WITH DIFFERENTIAL/PLATELET
Basophils Absolute: 0.2 10*3/uL — ABNORMAL HIGH (ref 0–0.1)
Basophils Relative: 1 %
EOS PCT: 0 %
Eosinophils Absolute: 0 10*3/uL (ref 0–0.7)
HCT: 38.3 % (ref 35.0–47.0)
HEMOGLOBIN: 12.3 g/dL (ref 12.0–16.0)
Lymphocytes Relative: 7 %
Lymphs Abs: 0.9 10*3/uL — ABNORMAL LOW (ref 1.0–3.6)
MCH: 29 pg (ref 26.0–34.0)
MCHC: 32.1 g/dL (ref 32.0–36.0)
MCV: 90.4 fL (ref 80.0–100.0)
Monocytes Absolute: 0.9 10*3/uL (ref 0.2–0.9)
Monocytes Relative: 7 %
Neutro Abs: 11.5 10*3/uL — ABNORMAL HIGH (ref 1.4–6.5)
Neutrophils Relative %: 85 %
PLATELETS: 212 10*3/uL (ref 150–440)
RBC: 4.24 MIL/uL (ref 3.80–5.20)
RDW: 14.8 % — ABNORMAL HIGH (ref 11.5–14.5)
WBC: 13.5 10*3/uL — AB (ref 3.6–11.0)

## 2015-09-29 LAB — PROTIME-INR
INR: 1.92
PROTHROMBIN TIME: 21.9 s — AB (ref 11.4–15.0)

## 2015-09-29 LAB — TROPONIN I
Troponin I: <0.03 ng/mL (ref <0.031)
Troponin I: <0.03 ng/mL (ref <0.031)

## 2015-09-29 MED ORDER — HYDROMORPHONE HCL 1 MG/ML IJ SOLN
0.5000 mg | Freq: Once | INTRAMUSCULAR | Status: AC
  Administered 2015-09-29: 0.5 mg via INTRAVENOUS
  Filled 2015-09-29: qty 1

## 2015-09-29 MED ORDER — FENTANYL CITRATE (PF) 100 MCG/2ML IJ SOLN
50.0000 ug | Freq: Once | INTRAMUSCULAR | Status: AC
  Administered 2015-09-29: 50 ug via INTRAVENOUS

## 2015-09-29 MED ORDER — ONDANSETRON HCL 4 MG/2ML IJ SOLN
4.0000 mg | Freq: Once | INTRAMUSCULAR | Status: AC
  Administered 2015-09-29: 4 mg via INTRAVENOUS
  Filled 2015-09-29: qty 2

## 2015-09-29 MED ORDER — FENTANYL CITRATE (PF) 100 MCG/2ML IJ SOLN
INTRAMUSCULAR | Status: AC
  Administered 2015-09-29: 50 ug via INTRAVENOUS
  Filled 2015-09-29: qty 2

## 2015-09-29 MED ORDER — FENTANYL CITRATE (PF) 100 MCG/2ML IJ SOLN
50.0000 ug | Freq: Once | INTRAMUSCULAR | Status: AC
  Administered 2015-09-29: 50 ug via INTRAVENOUS
  Filled 2015-09-29: qty 2

## 2015-09-29 NOTE — ED Notes (Signed)
Attempted to ambulate pt,  Pt was unable to ambulate well.  Informed Dr Lenard LancePaduchowski, MD to see pt and speak with family regarding admission.

## 2015-09-29 NOTE — ED Notes (Signed)
Patient transported to CT 

## 2015-09-29 NOTE — ED Notes (Signed)
Patient arrived by EMS from home. Patient reports she was at home making food and the next thing she knew she fell. Patient presents with skin tear on her R upper arm and hematoma on L posterior head, no blood noted. Patient takes blood thinners, coumadin and aspirin. Patient has HX of COPD and wears 3L continuous.

## 2015-09-29 NOTE — ED Provider Notes (Signed)
[facility] Emergency Department Provider Note  Time seen: 5:06 PM  I have reviewed the triage vital signs and the nursing notes.   HISTORY  Chief Complaint Chest Pain and Fall    HPI Maureen Wilson is a 74 y.o. female with a past medical history of COPD, hypertension, atrial fibrillation on Coumadin who presents the emergency department after a fall. According to the patient she was at home in the kitchen preparing food, she states she was using her walker when she fell. She is not short happen but states that the walker slipped out from under her and she fell she thinks onto her right side. Does not believe she lost consciousness but cannot be sure. Patient does have a considerable skin tear to the right upper extremity, hematoma to the right occipital scalp, and complaining of chest pain, which started after the fall.Describes her pain as moderate. Worse when she attempts to move.     Past Medical History  Diagnosis Date  . COPD (chronic obstructive pulmonary disease) (HCC)   . Hypertension   . Atrial fibrillation (HCC)     There are no active problems to display for this patient.   Past Surgical History  Procedure Laterality Date  . Partial hip arthroplasty Right   . Total hip arthroplasty Left   . Abdominal hysterectomy      No current outpatient prescriptions on file.  Allergies Iohexol  History reviewed. No pertinent family history.  Social History Social History  Substance Use Topics  . Smoking status: Never Smoker   . Smokeless tobacco: Never Used  . Alcohol Use: No    Review of Systems Constitutional: Negative for fever. Cardiovascular: Positive for chest pain. Respiratory: Negative for shortness of breath. Gastrointestinal: Negative for abdominal pain Genitourinary: Negative for dysuria. Musculoskeletal: Positive for chest pain, denies neck or back pain. Positive for pelvis pain. Positive for right arm pain. Skin:  Skin tear to right upper extremity Neurological: Negative for headaches, focal weakness or numbness.  10-point ROS otherwise negative.  ____________________________________________   PHYSICAL EXAM:  VITAL SIGNS: ED Triage Vitals  Enc Vitals Group     BP 09/29/15 1654 104/76 mmHg     Pulse Rate 09/29/15 1654 85     Resp 09/29/15 1654 20     Temp 09/29/15 1654 98.5 F (36.9 C)     Temp Source 09/29/15 1654 Oral     SpO2 09/29/15 1654 92 %     Weight 09/29/15 1654 236 lb 12.8 oz (107.412 kg)     Height --      Head Cir --      Peak Flow --      Pain Score --      Pain Loc --      Pain Edu? --      Excl. in GC? --     Constitutional: Alert and oriented. Well appearing and in no distress. Eyes: Normal exam ENT   Head: Small hematoma to right occipital scalp. No abrasion or laceration. No cervical spine tenderness, towel/sheet wrapped around neck to stabilize C-spine.   Mouth/Throat: Mucous membranes are moist. Cardiovascular: Irregular rhythm rate around 90 bpm. Respiratory: Normal respiratory effort without tachypnea nor retractions. Breath sounds are clear. Moderate central chest tenderness to palpation. Gastrointestinal: Soft and nontender. No distention.  Obese. Musculoskeletal: Approximate 10 cm skin tear to right upper extremity, hemostatic. Good range of motion in all extremities, mild right hip tenderness to palpation, but good range of motion. Neurologic:  Normal speech and language. No gross focal neurologic deficits  Skin:  Skin is warm, dry. Skin tear to right upper extremity as noted above. Psychiatric: Mood and affect are normal. Speech and behavior are normal.   ____________________________________________    EKG  EKG reviewed and interpreted by myself shows atrial fibrillation at 78 bpm, normal axis and nonspecific ST changes.  ____________________________________________    RADIOLOGY  CT is negative. X-rays are  negative.  ____________________________________________    INITIAL IMPRESSION / ASSESSMENT AND PLAN / ED COURSE  Pertinent labs & imaging results that were available during my care of the patient were reviewed by me and considered in my medical decision making (see chart for details).  Patient presents the emergency department after a fall, likely mechanical fall. We'll obtain a CT head, cervical spine, pelvis x-ray, chest x-ray, and obtain labs including troponin to help further evaluate. Patient is agreeable to plan. Skin tear repaired with Steri-Strips with good coverage, we will wrap for protection.  Maureen Landngela largely within normal limits including second troponin. Imaging is negative however the patient continues to complain of diffuse pain of her back and legs, unable to ambulate with assistance due to discomfort. Family does not believe they can adequately care for the patient at home. We'll admit the patient to the hospital for pain control.  ____________________________________________   FINAL CLINICAL IMPRESSION(S) / ED DIAGNOSES  Maureen RossFall   Maureen Gandolfo, MD 09/29/15 2216

## 2015-09-30 ENCOUNTER — Observation Stay: Payer: Medicare Other

## 2015-09-30 LAB — BASIC METABOLIC PANEL
Anion gap: 13 (ref 5–15)
BUN: 30 mg/dL — AB (ref 6–20)
CO2: 37 mmol/L — AB (ref 22–32)
Calcium: 9.2 mg/dL (ref 8.9–10.3)
Chloride: 85 mmol/L — ABNORMAL LOW (ref 101–111)
Creatinine, Ser: 2.22 mg/dL — ABNORMAL HIGH (ref 0.44–1.00)
GFR calc Af Amer: 24 mL/min — ABNORMAL LOW (ref >60)
GFR, EST NON AFRICAN AMERICAN: 21 mL/min — AB (ref >60)
GLUCOSE: 112 mg/dL — AB (ref 65–99)
POTASSIUM: 3.1 mmol/L — AB (ref 3.5–5.1)
Sodium: 135 mmol/L (ref 135–145)

## 2015-09-30 LAB — MRSA PCR SCREENING: MRSA by PCR: POSITIVE — AB

## 2015-09-30 LAB — CBC
HEMATOCRIT: 35 % (ref 35.0–47.0)
Hemoglobin: 11.2 g/dL — ABNORMAL LOW (ref 12.0–16.0)
MCH: 29.1 pg (ref 26.0–34.0)
MCHC: 32.1 g/dL (ref 32.0–36.0)
MCV: 90.6 fL (ref 80.0–100.0)
Platelets: 176 10*3/uL (ref 150–440)
RBC: 3.86 MIL/uL (ref 3.80–5.20)
RDW: 15.6 % — AB (ref 11.5–14.5)
WBC: 11.2 10*3/uL — ABNORMAL HIGH (ref 3.6–11.0)

## 2015-09-30 LAB — GLUCOSE, CAPILLARY
GLUCOSE-CAPILLARY: 104 mg/dL — AB (ref 65–99)
GLUCOSE-CAPILLARY: 171 mg/dL — AB (ref 65–99)
GLUCOSE-CAPILLARY: 120 mg/dL — AB (ref 65–99)
Glucose-Capillary: 126 mg/dL — ABNORMAL HIGH (ref 65–99)

## 2015-09-30 LAB — APTT: aPTT: 38 seconds — ABNORMAL HIGH (ref 24–36)

## 2015-09-30 MED ORDER — TAMSULOSIN HCL 0.4 MG PO CAPS
0.4000 mg | ORAL_CAPSULE | Freq: Every day | ORAL | Status: DC
  Administered 2015-09-30 – 2015-10-05 (×5): 0.4 mg via ORAL
  Filled 2015-09-30 (×5): qty 1

## 2015-09-30 MED ORDER — PANTOPRAZOLE SODIUM 40 MG PO TBEC
40.0000 mg | DELAYED_RELEASE_TABLET | Freq: Every day | ORAL | Status: DC
  Administered 2015-09-30 – 2015-10-05 (×6): 40 mg via ORAL
  Filled 2015-09-30 (×6): qty 1

## 2015-09-30 MED ORDER — ONDANSETRON HCL 4 MG/2ML IJ SOLN
4.0000 mg | Freq: Four times a day (QID) | INTRAMUSCULAR | Status: DC | PRN
  Administered 2015-10-02: 06:00:00 4 mg via INTRAVENOUS
  Filled 2015-09-30: qty 2

## 2015-09-30 MED ORDER — IPRATROPIUM-ALBUTEROL 0.5-2.5 (3) MG/3ML IN SOLN
3.0000 mL | RESPIRATORY_TRACT | Status: DC | PRN
  Administered 2015-10-02 – 2015-10-04 (×2): 3 mL via RESPIRATORY_TRACT
  Filled 2015-09-30 (×2): qty 3

## 2015-09-30 MED ORDER — WARFARIN - PHYSICIAN DOSING INPATIENT: Freq: Every day | Status: DC

## 2015-09-30 MED ORDER — RISPERIDONE 0.5 MG PO TABS
0.5000 mg | ORAL_TABLET | Freq: Every day | ORAL | Status: DC
  Administered 2015-09-30 – 2015-10-01 (×2): 0.5 mg via ORAL
  Filled 2015-09-30 (×2): qty 1

## 2015-09-30 MED ORDER — SODIUM CHLORIDE 0.9 % IJ SOLN: 3.0000 mL | INTRAMUSCULAR | Status: DC | PRN

## 2015-09-30 MED ORDER — FLUTICASONE PROPIONATE 50 MCG/ACT NA SUSP
2.0000 | Freq: Every day | NASAL | Status: DC
  Administered 2015-09-30 – 2015-10-04 (×3): 2 via NASAL
  Filled 2015-09-30: qty 16

## 2015-09-30 MED ORDER — HYDROCODONE-ACETAMINOPHEN 5-325 MG PO TABS
1.0000 | ORAL_TABLET | ORAL | Status: DC | PRN
  Administered 2015-09-30 (×2): 1 via ORAL
  Filled 2015-09-30 (×2): qty 1

## 2015-09-30 MED ORDER — METOLAZONE 2.5 MG PO TABS: 2.5000 mg | ORAL_TABLET | ORAL | Status: DC

## 2015-09-30 MED ORDER — WARFARIN SODIUM 2 MG PO TABS: 2.0000 mg | ORAL_TABLET | Freq: Every day | ORAL | Status: DC

## 2015-09-30 MED ORDER — IPRATROPIUM-ALBUTEROL 0.5-2.5 (3) MG/3ML IN SOLN
3.0000 mL | RESPIRATORY_TRACT | Status: DC
  Administered 2015-09-30 (×4): 3 mL via RESPIRATORY_TRACT
  Filled 2015-09-30 (×4): qty 3

## 2015-09-30 MED ORDER — POTASSIUM CHLORIDE CRYS ER 20 MEQ PO TBCR
20.0000 meq | EXTENDED_RELEASE_TABLET | Freq: Every day | ORAL | Status: DC
  Administered 2015-09-30 – 2015-10-05 (×5): 20 meq via ORAL
  Filled 2015-09-30 (×5): qty 1

## 2015-09-30 MED ORDER — PREDNISONE 5 MG PO TABS
5.0000 mg | ORAL_TABLET | Freq: Every day | ORAL | Status: DC
  Administered 2015-09-30 – 2015-10-05 (×6): 5 mg via ORAL
  Filled 2015-09-30 (×6): qty 1

## 2015-09-30 MED ORDER — SENNOSIDES-DOCUSATE SODIUM 8.6-50 MG PO TABS
1.0000 | ORAL_TABLET | Freq: Every evening | ORAL | Status: DC | PRN
  Administered 2015-10-05: 1 via ORAL
  Filled 2015-09-30: qty 1

## 2015-09-30 MED ORDER — ATORVASTATIN CALCIUM 20 MG PO TABS
20.0000 mg | ORAL_TABLET | Freq: Every day | ORAL | Status: DC
  Administered 2015-09-30 – 2015-10-04 (×6): 20 mg via ORAL
  Filled 2015-09-30 (×6): qty 1

## 2015-09-30 MED ORDER — SODIUM CHLORIDE 0.9 % IJ SOLN
3.0000 mL | Freq: Two times a day (BID) | INTRAMUSCULAR | Status: DC
Start: 2015-09-30 — End: 2015-10-01
  Administered 2015-09-30 – 2015-10-01 (×4): 3 mL via INTRAVENOUS

## 2015-09-30 MED ORDER — WARFARIN SODIUM 2 MG PO TABS
3.0000 mg | ORAL_TABLET | Freq: Every day | ORAL | Status: AC
  Administered 2015-09-30: 3 mg via ORAL
  Filled 2015-09-30: qty 1

## 2015-09-30 MED ORDER — VITAMIN D 1000 UNITS PO TABS
2000.0000 [IU] | ORAL_TABLET | Freq: Every day | ORAL | Status: DC
  Administered 2015-09-30 – 2015-10-04 (×4): 2000 [IU] via ORAL
  Filled 2015-09-30 (×4): qty 2

## 2015-09-30 MED ORDER — SODIUM CHLORIDE 0.9 % IV SOLN: 250.0000 mL | INTRAVENOUS | Status: DC | PRN

## 2015-09-30 MED ORDER — HYDROMORPHONE HCL 1 MG/ML IJ SOLN
1.0000 mg | INTRAMUSCULAR | Status: DC | PRN
  Administered 2015-10-02: 1 mg via INTRAVENOUS
  Filled 2015-09-30: qty 1

## 2015-09-30 MED ORDER — ASPIRIN EC 81 MG PO TBEC
81.0000 mg | DELAYED_RELEASE_TABLET | Freq: Every day | ORAL | Status: DC
  Administered 2015-09-30 – 2015-10-05 (×5): 81 mg via ORAL
  Filled 2015-09-30 (×5): qty 1

## 2015-09-30 MED ORDER — ALPRAZOLAM 0.25 MG PO TABS
0.2500 mg | ORAL_TABLET | Freq: Three times a day (TID) | ORAL | Status: DC | PRN
  Administered 2015-09-30 – 2015-10-01 (×3): 0.25 mg via ORAL
  Filled 2015-09-30 (×3): qty 1

## 2015-09-30 MED ORDER — INSULIN ASPART 100 UNIT/ML ~~LOC~~ SOLN: 0.0000 [IU] | Freq: Every day | SUBCUTANEOUS | Status: DC

## 2015-09-30 MED ORDER — FUROSEMIDE 40 MG PO TABS
40.0000 mg | ORAL_TABLET | Freq: Every day | ORAL | Status: DC
  Administered 2015-09-30 – 2015-10-03 (×3): 40 mg via ORAL
  Filled 2015-09-30 (×3): qty 1

## 2015-09-30 MED ORDER — OXYCODONE-ACETAMINOPHEN 5-325 MG PO TABS
1.0000 | ORAL_TABLET | ORAL | Status: DC | PRN
  Administered 2015-09-30: 2 via ORAL
  Administered 2015-09-30: 1 via ORAL
  Administered 2015-10-01 – 2015-10-02 (×3): 2 via ORAL
  Filled 2015-09-30 (×2): qty 2
  Filled 2015-09-30: qty 1
  Filled 2015-09-30 (×2): qty 2

## 2015-09-30 MED ORDER — INSULIN ASPART 100 UNIT/ML ~~LOC~~ SOLN
0.0000 [IU] | Freq: Three times a day (TID) | SUBCUTANEOUS | Status: DC
  Administered 2015-09-30: 17:00:00 4 [IU] via SUBCUTANEOUS
  Administered 2015-09-30: 12:00:00 3 [IU] via SUBCUTANEOUS
  Administered 2015-10-01: 4 [IU] via SUBCUTANEOUS
  Administered 2015-10-01: 17:00:00 3 [IU] via SUBCUTANEOUS
  Administered 2015-10-02 – 2015-10-03 (×2): 4 [IU] via SUBCUTANEOUS
  Administered 2015-10-05: 3 [IU] via SUBCUTANEOUS
  Filled 2015-09-30 (×2): qty 3
  Filled 2015-09-30 (×4): qty 4
  Filled 2015-09-30 (×2): qty 3

## 2015-09-30 MED ORDER — WARFARIN - PHARMACIST DOSING INPATIENT
Freq: Every day | Status: DC
  Administered 2015-09-30 – 2015-10-04 (×4)

## 2015-09-30 MED ORDER — ATENOLOL 25 MG PO TABS
25.0000 mg | ORAL_TABLET | Freq: Two times a day (BID) | ORAL | Status: DC
  Administered 2015-09-30 – 2015-10-05 (×10): 25 mg via ORAL
  Filled 2015-09-30 (×11): qty 1

## 2015-09-30 MED ORDER — LEVOTHYROXINE SODIUM 50 MCG PO TABS
50.0000 ug | ORAL_TABLET | Freq: Every day | ORAL | Status: DC
  Administered 2015-09-30 – 2015-10-05 (×6): 50 ug via ORAL
  Filled 2015-09-30 (×5): qty 1
  Filled 2015-09-30: qty 2

## 2015-09-30 MED ORDER — ONDANSETRON HCL 4 MG PO TABS: 4.0000 mg | ORAL_TABLET | Freq: Four times a day (QID) | ORAL | Status: DC | PRN

## 2015-09-30 MED ORDER — ESCITALOPRAM OXALATE 10 MG PO TABS
10.0000 mg | ORAL_TABLET | Freq: Every day | ORAL | Status: DC
  Administered 2015-09-30 – 2015-10-05 (×5): 10 mg via ORAL
  Filled 2015-09-30 (×5): qty 1

## 2015-09-30 MED ORDER — SODIUM CHLORIDE 0.9 % IJ SOLN
3.0000 mL | Freq: Two times a day (BID) | INTRAMUSCULAR | Status: DC
  Administered 2015-09-30 – 2015-10-05 (×10): 3 mL via INTRAVENOUS

## 2015-09-30 MED ORDER — DILTIAZEM HCL ER COATED BEADS 180 MG PO CP24
180.0000 mg | ORAL_CAPSULE | Freq: Every day | ORAL | Status: DC
  Administered 2015-09-30 – 2015-10-05 (×5): 180 mg via ORAL
  Filled 2015-09-30 (×5): qty 1

## 2015-09-30 MED ORDER — BUDESONIDE-FORMOTEROL FUMARATE 160-4.5 MCG/ACT IN AERO
2.0000 | INHALATION_SPRAY | Freq: Two times a day (BID) | RESPIRATORY_TRACT | Status: DC
  Administered 2015-09-30 – 2015-10-01 (×4): 2 via RESPIRATORY_TRACT
  Filled 2015-09-30: qty 6

## 2015-09-30 MED ORDER — BENZONATATE 100 MG PO CAPS: 100.0000 mg | ORAL_CAPSULE | Freq: Two times a day (BID) | ORAL | Status: DC | PRN

## 2015-09-30 MED ORDER — POTASSIUM CHLORIDE CRYS ER 20 MEQ PO TBCR
40.0000 meq | EXTENDED_RELEASE_TABLET | Freq: Once | ORAL | Status: AC
  Administered 2015-09-30: 14:00:00 40 meq via ORAL
  Filled 2015-09-30: qty 2

## 2015-09-30 MED ORDER — ALBUTEROL SULFATE (2.5 MG/3ML) 0.083% IN NEBU: 3.0000 mL | INHALATION_SOLUTION | Freq: Four times a day (QID) | RESPIRATORY_TRACT | Status: DC | PRN

## 2015-09-30 NOTE — Clinical Social Work Note (Signed)
Clinical Social Worker was consulted for trouble affording medications. CSW discussed pt with RN, RNCM, and Charge RN during progression. Per RNCM, information for medication assistance was provided. PT is recommending SNF as pt was in too much pain to work. Pt is Medicare OBS, therefore pt would be private pay at facility. Pt has requested PT to come back. CSW will continue to follow.   Dede QuerySarah Mckenzey Parcell, MSW, LCSW Clinical Social Worker  [phone]

## 2015-09-30 NOTE — Plan of Care (Signed)
Problem: Education: Goal: Knowledge of  General Education information/materials will improve Outcome: Completed/Met Date Met:  09/30/15 Education on patient safety, plan of care, and pain management reviewed w/ pt and daughter at bedside.   Problem: Safety: Goal: Ability to remain free from injury will improve Outcome: Progressing High fall risk. Pt fell at home last night w/ unknown cause. Bed alarm on, room close to nurses station, hourly rounding. Daughter at bedside. Pt and family understand how to use call system for assistance. Safe environment provided.   Problem: Health Behavior/Discharge Planning: Goal: Ability to manage health-related needs will improve Outcome: Progressing Pt from home w/ husband and daughter. Baseline: ambulates w/ walker. Currently pt is unable to even turn in bed independently due to pain in right hip and arm--very little motivation to move even w/ encouragement. All scans negative for fractures. Unsure how pt will be able to safely d/c home at this time. PT to evaluate for further needs.   Problem: Pain Managment: Goal: General experience of comfort will improve Outcome: Progressing C/o of right hip pain relieved by PRN Vicodin. Emotional support given. Refuses Q2H turning.  Problem: Skin Integrity: Goal: Risk for impaired skin integrity will decrease Outcome: Progressing Skin care provided. Right upper arm skin tear bandage changed.   Problem: Activity: Goal: Risk for activity intolerance will decrease Outcome: Progressing Chronic 3L [place]. Rest periods provided due to SOB w/ exertion.

## 2015-09-30 NOTE — H&P (Addendum)
[facility] - [place] at [facility]   PATIENT NAME: Maureen Wilson    MR#:  [id number]  DATE OF BIRTH:  October 02, 1941  DATE OF ADMISSION:  09/29/2015  PRIMARY CARE PHYSICIAN: Joanna Hews, MD   REQUESTING/REFERRING PHYSICIAN:   CHIEF COMPLAINT:   Chief Complaint  Patient presents with  . Chest Pain  . Fall    HISTORY OF PRESENT ILLNESS: Maureen Wilson  is a 74 y.o. female with a known history of COPD on home oxygen, hypertension, atrial fibrillation on Coumadin for anticoagulation presented to the emergency room after having a fall. Patient was in the kitchen and that trying to prepare meal , she lost balance and fell on the right side. Uses a walker to ambulate at home. Patient does not remember whether she blacked out. No history of any seizure when she had a fall. Has ecchymosis over the upper extremity secondary to fall. Also has pain in the right hip after she fell today. Pain in the right it is aching in nature 7 out of 10 on a scale of 1-10. Patient did not complain any chest pain in the emergency room. Imaging studies did not reveal any fracture. No history of any head injury. No shortness of breath. Patient was unable to get up and stand up and ambulate secondary to the pain. Hospitalist service was consulted for further care of the patient. IV Dilaudid to relieved the pain in the emergency room.  PAST MEDICAL HISTORY:   Past Medical History  Diagnosis Date  . COPD (chronic obstructive pulmonary disease) (HCC)   . Hypertension   . Atrial fibrillation (HCC)     PAST SURGICAL HISTORY:  Past Surgical History  Procedure Laterality Date  . Partial hip arthroplasty Right   . Total hip arthroplasty Left   . Abdominal hysterectomy      SOCIAL HISTORY:  Social History  Substance Use Topics  . Smoking status: Never Smoker   . Smokeless tobacco: Never Used  . Alcohol Use: No    FAMILY HISTORY:  Family History  Problem Relation Age of Onset  .  Hypertension Son     DRUG ALLERGIES:  Allergies  Allergen Reactions  . Amlodipine Other (See Comments)    10mg  causes swelling  . Hydralazine Other (See Comments)    Lip/mouth swelling  . Iohexol      Code: HIVES, Desc: pt developed hives after receiving iv dye for ct scan.  suggest she be premedicated for future exams, Onset Date: [id number]   . Metoprolol Other (See Comments)    Lip/mouth swelling  . Olmesartan Other (See Comments)    Oral edema  . Ramipril Swelling    Oral  . Sulfa Antibiotics Other (See Comments)    angioedema  . Telmisartan Other (See Comments)    Lips/mouth swelling  . Tiotropium Swelling    REVIEW OF SYSTEMS:   CONSTITUTIONAL: No fever, fatigue or weakness.  EYES: No blurred or double vision.  EARS, NOSE, AND THROAT: No tinnitus or ear pain.  RESPIRATORY: No cough, shortness of breath, wheezing or hemoptysis.  CARDIOVASCULAR: No chest pain, orthopnea, has edema.  GASTROINTESTINAL: No nausea, vomiting, diarrhea or abdominal pain.  GENITOURINARY: No dysuria, hematuria.  ENDOCRINE: No polyuria, nocturia,  HEMATOLOGY: No anemia, easy bruising or bleeding SKIN: ecchymosis over both upper extremities MUSCULOSKELETAL: pain in the right hip NEUROLOGIC: difficulty ambulating,no numbness or tingling  PSYCHIATRY: No anxiety or depression.   MEDICATIONS AT HOME:  Prior to Admission medications   Medication  Sig Start Date End Date Taking? Authorizing Provider  albuterol (PROVENTIL) (2.5 MG/3ML) 0.083% nebulizer solution Inhale 3 mLs into the lungs every 6 (six) hours as needed. 09/14/15 09/13/16 Yes Historical Provider, MD  albuterol-ipratropium (COMBIVENT) 18-103 MCG/ACT inhaler Inhale 1-2 puffs into the lungs every 4 (four) hours.   Yes Historical Provider, MD  ALPRAZolam (XANAX) 0.25 MG tablet Take 0.25 mg by mouth 3 (three) times daily as needed.  12/01/14  Yes Historical Provider, MD  aspirin EC 81 MG tablet Take 81 mg by mouth daily.   Yes Historical  Provider, MD  atenolol (TENORMIN) 50 MG tablet Take 0.5 tablets by mouth 2 (two) times daily.   Yes Historical Provider, MD  atorvastatin (LIPITOR) 20 MG tablet Take 1 tablet by mouth daily. 03/17/15  Yes Historical Provider, MD  benzonatate (TESSALON) 100 MG capsule Take 1 capsule by mouth 2 (two) times daily as needed. 09/13/15  Yes Historical Provider, MD  budesonide-formoterol (SYMBICORT) 160-4.5 MCG/ACT inhaler Inhale 2 puffs into the lungs 2 (two) times daily. 09/14/15 09/13/16 Yes Historical Provider, MD  cetirizine (ZYRTEC) 10 MG tablet Take 1 tablet by mouth daily.   Yes Historical Provider, MD  Cholecalciferol (D 2000) 2000 UNITS TABS Take 2,000 Units by mouth daily.   Yes Historical Provider, MD  clonazePAM (KLONOPIN) 0.5 MG tablet Take 0.5-1 tablets by mouth 3 (three) times daily. [facility] and the 1 tablet at bedtime   Yes Historical Provider, MD  diltiazem (CARDIZEM CD) 180 MG 24 hr capsule Take 180 mg by mouth daily.  12/01/14  Yes Historical Provider, MD  escitalopram (LEXAPRO) 10 MG tablet Take 10 mg by mouth daily.  12/11/14  Yes Historical Provider, MD  esomeprazole (NEXIUM) 40 MG capsule Take by mouth 2 (two) times daily.   Yes Historical Provider, MD  fluticasone (FLONASE) 50 MCG/ACT nasal spray Place 2 sprays into the nose daily.    Yes Historical Provider, MD  Fluticasone-Salmeterol (ADVAIR) 250-50 MCG/DOSE AEPB Inhale 1 puff into the lungs 2 (two) times daily.   Yes Historical Provider, MD  furosemide (LASIX) 40 MG tablet Take by mouth 2 (two) times daily.   Yes Historical Provider, MD  levothyroxine (SYNTHROID, LEVOTHROID) 50 MCG tablet Take by mouth. Take 1 tablet daily except on Monday and Thursday, take 1.5 tablets   Yes Historical Provider, MD  metolazone (ZAROXOLYN) 2.5 MG tablet Take 2.5 mg by mouth 2 (two) times a week. On Monday and Thursday   Yes Historical Provider, MD  montelukast (SINGULAIR) 10 MG tablet TAKE ONE TABLET AT BEDTIME. 08/02/15  Yes  Historical Provider, MD  nystatin (MYCOSTATIN) 100000 UNIT/ML suspension Take 5 mLs by mouth 4 (four) times daily.  04/26/15  Yes Historical Provider, MD  ondansetron (ZOFRAN) 4 MG tablet Take 4 mg by mouth every 8 (eight) hours as needed.    Yes Historical Provider, MD  potassium chloride SA (K-DUR,KLOR-CON) 20 MEQ tablet Take 20 mEq by mouth daily.  06/20/15  Yes Historical Provider, MD  predniSONE (DELTASONE) 5 MG tablet Take 5 mg by mouth daily with breakfast.   Yes Historical Provider, MD  risperiDONE (RISPERDAL) 0.5 MG tablet Take 0.5 mg by mouth daily.  12/01/14  Yes Historical Provider, MD  tamsulosin (FLOMAX) 0.4 MG CAPS capsule Take 0.4 mg by mouth daily.    Yes Historical Provider, MD  traMADol (ULTRAM) 50 MG tablet Take 50 mg by mouth every 6 (six) hours as needed.    Yes Historical Provider, MD  warfarin (COUMADIN) 2 MG tablet TAKE ONE (1) TABLET BY MOUTH ONCE DAILY 07/14/15  Yes Historical Provider, MD      PHYSICAL EXAMINATION:   VITAL SIGNS: Blood pressure 126/89, pulse 90, temperature 98.5 F (36.9 C), temperature source Oral, resp. rate 12, weight 107.412 kg (236 lb 12.8 oz), SpO2 99 %.  GENERAL:  74 y.o.-year-old patient lying in the bed with no acute distress.  EYES: Pupils equal, round, reactive to light and accommodation. No scleral icterus. Extraocular muscles intact.  HEENT: Head atraumatic, normocephalic. Oropharynx and nasopharynx clear.  NECK:  Supple, no jugular venous distention. No thyroid enlargement, no tenderness.  LUNGS: Normal breath sounds bilaterally, no wheezing, rales,rhonchi or crepitation. No use of accessory muscles of respiration.  CARDIOVASCULAR: S1, S2 irregular. No murmurs, rubs, or gallops.  ABDOMEN: Soft, nontender, nondistended. Bowel sounds present. No organomegaly or mass.  EXTREMITIES:  pedal edema present,ecchymosis over the upper extremities, no cyanosis, or clubbing.  NEUROLOGIC: Cranial nerves II through XII are intact. Muscle strength 5/5  in all extremities. Sensation intact. Unable to stand and balance PSYCHIATRIC: The patient is alert and oriented x 3.  SKIN: abrasions and ecchymosis over the extremities.  LABORATORY PANEL:   CBC  Recent Labs Lab 09/29/15 1731  WBC 13.5*  HGB 12.3  HCT 38.3  PLT 212  MCV 90.4  MCH 29.0  MCHC 32.1  RDW 14.8*  LYMPHSABS 0.9*  MONOABS 0.9  EOSABS 0.0  BASOSABS 0.2*   ------------------------------------------------------------------------------------------------------------------  Chemistries   Recent Labs Lab 09/29/15 1731  NA 130*  K 3.8  CL 85*  CO2 32  GLUCOSE 134*  BUN 27*  CREATININE 2.15*  CALCIUM 9.2  AST 39  ALT 26  ALKPHOS 108  BILITOT 1.5*   ------------------------------------------------------------------------------------------------------------------ CrCl cannot be calculated (Unknown ideal weight.). ------------------------------------------------------------------------------------------------------------------ No results for input(s): TSH, T4TOTAL, T3FREE, THYROIDAB in the last 72 hours.  Invalid input(s): FREET3   Coagulation profile  Recent Labs Lab 09/29/15 1825  INR 1.92   ------------------------------------------------------------------------------------------------------------------- No results for input(s): DDIMER in the last 72 hours. -------------------------------------------------------------------------------------------------------------------  Cardiac Enzymes  Recent Labs Lab 09/29/15 1731 09/29/15 2049  TROPONINI <0.03 <0.03   ------------------------------------------------------------------------------------------------------------------ Invalid input(s): POCBNP  ---------------------------------------------------------------------------------------------------------------  Urinalysis    Component Value Date/Time   COLORURINE YELLOW 01/11/2015 0950   COLORURINE YELLOW 11/21/2010 1115   APPEARANCEUR  CLEAR 01/11/2015 0950   APPEARANCEUR CLEAR 11/21/2010 1115   LABSPEC 1.005 01/11/2015 0950   LABSPEC 1.019 11/21/2010 1115   PHURINE 6.0 01/11/2015 0950   PHURINE 7.0 11/21/2010 1115   GLUCOSEU NEGATIVE 01/11/2015 0950   GLUCOSEU NEGATIVE 06/05/2010 0954   HGBUR NEGATIVE 01/11/2015 0950   HGBUR NEGATIVE 11/21/2010 1115   BILIRUBINUR NEGATIVE 01/11/2015 0950   BILIRUBINUR NEGATIVE 11/21/2010 1115   KETONESUR NEGATIVE 01/11/2015 0950   KETONESUR NEGATIVE 11/21/2010 1115   PROTEINUR NEGATIVE 01/11/2015 0950   PROTEINUR NEGATIVE 11/21/2010 1115   UROBILINOGEN 1.0 11/21/2010 1115   NITRITE NEGATIVE 01/11/2015 0950   NITRITE NEGATIVE 11/21/2010 1115   LEUKOCYTESUR NEGATIVE 01/11/2015 0950   LEUKOCYTESUR  11/21/2010 1115    NEGATIVE MICROSCOPIC NOT DONE ON URINES WITH NEGATIVE PROTEIN, BLOOD, LEUKOCYTES, NITRITE, OR GLUCOSE <1000 mg/dL.     RADIOLOGY: Dg Chest 2 View  09/29/2015  CLINICAL DATA:  Recent fall. Hematoma on the head. Patient is on blood thinners. EXAM: CHEST  2 VIEW COMPARISON:  12/18/2014 and 12/19/2014 FINDINGS: Chronic linear densities in the left mid lung and left lower chest. Findings are suggestive for scarring. Heart size is upper  limits of normal but unchanged. Negative for a pneumothorax. Stable large calcification in the right upper abdomen. No acute bone abnormality. IMPRESSION: No acute findings. Scarring and/or atelectasis in the left lung. Electronically Signed   By: Richarda Overlie M.D.   On: 09/29/2015 18:14   Dg Pelvis 1-2 Views  09/29/2015  CLINICAL DATA:  Fall. EXAM: PELVIS - 1-2 VIEW COMPARISON:  12/18/2014 FINDINGS: Again noted are bilateral hip replacements which are grossly intact. Surgical changes in the right pelvic region. Numerous pelvic calcifications are similar to the prior examination. Degenerative disc disease in the lower lumbar spine. Question old fractures involving the left pubic rami. Pelvic bony ring appears to be intact without an acute  fracture. The femoral prostheses are incompletely imaged. IMPRESSION: No acute abnormality in the pelvis. Electronically Signed   By: Richarda Overlie M.D.   On: 09/29/2015 18:17   Ct Head Wo Contrast  09/29/2015  CLINICAL DATA:  Status post fall with a blow to the left side of the head. Anticoagulated patient. Initial encounter. EXAM: CT HEAD WITHOUT CONTRAST CT CERVICAL SPINE WITHOUT CONTRAST TECHNIQUE: Multidetector CT imaging of the head and cervical spine was performed following the standard protocol without intravenous contrast. Multiplanar CT image reconstructions of the cervical spine were also generated. COMPARISON:  Head CT scan 12/18/2014. Head and cervical spine CT scan 11/02/2013. 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 No evidence of acute intracranial abnormality including hemorrhage, infarct, mass lesion, mass effect, midline shift or abnormal extra-axial fluid collection is identified. There is no hydrocephalus or pneumocephalus. The calvarium is intact. Imaged paranasal sinuses and mastoid air cells are clear. CT CERVICAL SPINE FINDINGS No fracture is identified. Trace anterolisthesis C4 on C5 due to facet arthropathy is noted. Loss of disc space height appears worst at C5-6 and C6-7. Lung apices are clear. IMPRESSION: No acute abnormality head or cervical spine. Atrophy and chronic microvascular ischemic change. Cervical spondylosis. Electronically Signed   By: Drusilla Kanner M.D.   On: 09/29/2015 18:20   Ct Cervical Spine Wo Contrast  09/29/2015  CLINICAL DATA:  Status post fall with a blow to the left side of the head. Anticoagulated patient. Initial encounter. EXAM: CT HEAD WITHOUT CONTRAST CT CERVICAL SPINE WITHOUT CONTRAST TECHNIQUE: Multidetector CT imaging of the head and cervical spine was performed following the standard protocol without intravenous contrast. Multiplanar CT  image reconstructions of the cervical spine were also generated. COMPARISON:  Head CT scan 12/18/2014. Head and cervical spine CT scan 11/02/2013. 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 No evidence of acute intracranial abnormality including hemorrhage, infarct, mass lesion, mass effect, midline shift or abnormal extra-axial fluid collection is identified. There is no hydrocephalus or pneumocephalus. The calvarium is intact. Imaged paranasal sinuses and mastoid air cells are clear. CT CERVICAL SPINE FINDINGS No fracture is identified. Trace anterolisthesis C4 on C5 due to facet arthropathy is noted. Loss of disc space height appears worst at C5-6 and C6-7. Lung apices are clear. IMPRESSION: No acute abnormality head or cervical spine. Atrophy and chronic microvascular ischemic change. Cervical spondylosis. Electronically Signed   By: Drusilla Kanner M.D.   On: 09/29/2015 18:20    EKG: Orders placed or performed during the hospital encounter of 09/29/15  . EKG 12-Lead  . EKG 12-Lead    IMPRESSION AND PLAN: 74 year old female patient  with history of COPD on home oxygen, chronic atrial fibrillation, hypertension had a fall at home after she lost balance. Patient has pain in the right hip and difficulty ambulating. Admitting diagnosis #1 fall #2 gait instability #3 chronic atrial fibrillation #4 COPD stable #5 hypertension #6 right hip pain #7 Renal Insufficiency could be secondary to diuretics Treatment plan #1 physical therapy evaluation #2 continue Coumadin and follow-up INR #3 inhalation treatments and oxygen via nasal cannula #4 pain management  All the records are reviewed and case discussed with ED provider. Management plans discussed with the patient, family and they are in agreement.  CODE STATUS:FULL    TOTAL TIME TAKING CARE OF THIS PATIENT: 41 minutes.    Ihor Austin M.D on 09/30/2015 at 12:05 AM  Between 7am to 6pm - Pager - [phone]  After 6pm go to [email or web address] - password [email or web address] [facility]  [facility]  Office  [phone]  CC: Primary care physician; Joanna Hews, MD

## 2015-09-30 NOTE — Progress Notes (Signed)
[facility] - [place] at [facility]   PATIENT NAME: Jacques Navyatricia Ruda    MR#:  [id number]  DATE OF BIRTH:  04/03/1941  SUBJECTIVE:  CHIEF COMPLAINT:   Chief Complaint  Patient presents with  . Chest Pain  . Fall   _ patient admitted after a fall. Walker at baseline. - Has worsened pain of right hip since fall- no fracture noted, unable to get up with physical therapy  REVIEW OF SYSTEMS:  Review of Systems  Constitutional: Negative for fever and chills.  HENT: Positive for hearing loss. Negative for ear discharge, ear pain and nosebleeds.   Eyes: Negative for blurred vision.  Respiratory: Negative for cough, shortness of breath and wheezing.   Cardiovascular: Positive for chest pain. Negative for palpitations and leg swelling.       Right sided chest pain from fall  Gastrointestinal: Negative for nausea, vomiting, abdominal pain, diarrhea and constipation.  Genitourinary: Negative for dysuria and urgency.  Musculoskeletal: Positive for myalgias, back pain, joint pain and falls.  Neurological: Negative for dizziness, sensory change, speech change, focal weakness, seizures, weakness and headaches.  Psychiatric/Behavioral: Negative for depression.    DRUG ALLERGIES:   Allergies  Allergen Reactions  . Amlodipine Other (See Comments)    10mg  causes swelling  . Hydralazine Other (See Comments)    Lip/mouth swelling  . Iohexol      Code: HIVES, Desc: pt developed hives after receiving iv dye for ct scan.  suggest she be premedicated for future exams, Onset Date: [id number]   . Metoprolol Other (See Comments)    Lip/mouth swelling  . Olmesartan Other (See Comments)    Oral edema  . Ramipril Swelling    Oral  . Sulfa Antibiotics Other (See Comments)    angioedema  . Telmisartan Other (See Comments)    Lips/mouth swelling  . Tiotropium Swelling    VITALS:  Blood pressure 111/58, pulse 59, temperature 97.8 F (36.6 C), temperature source Oral, resp.  rate 20, height 5\' 3"  (1.6 m), weight 104.282 kg (229 lb 14.4 oz), SpO2 94 %.  PHYSICAL EXAMINATION:  Physical Exam  GENERAL:  74 y.o.-year-old elderly patient lying in the bed with no acute distress.  EYES: Pupils equal, round, reactive to light and accommodation. No scleral icterus. Extraocular muscles intact.  HEENT: Head atraumatic, normocephalic. Oropharynx and nasopharynx clear.  NECK:  Supple, no jugular venous distention. No thyroid enlargement, no tenderness.  LUNGS: Normal breath sounds bilaterally, no wheezing, rales,rhonchi or crepitation. No use of accessory muscles of respiration.  CARDIOVASCULAR: S1, S2 normal. No rubs, or gallops. 3/6 systolic murmur ABDOMEN: Soft, nontender, nondistended. Bowel sounds present. No organomegaly or mass.  EXTREMITIES: No pedal edema, cyanosis, or clubbing. Bruising noted on her right arm Tenderness of right hip laterally, able to move with some guarding due to pain  NEUROLOGIC: Cranial nerves II through XII are intact. Muscle strength 5/5 in all extremities. Sensation intact. Gait not checked.  PSYCHIATRIC: The patient is alert and oriented x 3.  SKIN: No obvious rash, lesion, or ulcer.    LABORATORY PANEL:   CBC  Recent Labs Lab 09/30/15 0637  WBC 11.2*  HGB 11.2*  HCT 35.0  PLT 176   ------------------------------------------------------------------------------------------------------------------  Chemistries   Recent Labs Lab 09/29/15 1731 09/30/15 0637  NA 130* 135  K 3.8 3.1*  CL 85* 85*  CO2 32 37*  GLUCOSE 134* 112*  BUN 27* 30*  CREATININE 2.15* 2.22*  CALCIUM 9.2 9.2  AST 39  --  ALT 26  --   ALKPHOS 108  --   BILITOT 1.5*  --    ------------------------------------------------------------------------------------------------------------------  Cardiac Enzymes  Recent Labs Lab 09/29/15 2049  TROPONINI <0.03    ------------------------------------------------------------------------------------------------------------------  RADIOLOGY:  Dg Chest 2 View  09/29/2015  CLINICAL DATA:  Recent fall. Hematoma on the head. Patient is on blood thinners. EXAM: CHEST  2 VIEW COMPARISON:  12/18/2014 and 12/19/2014 FINDINGS: Chronic linear densities in the left mid lung and left lower chest. Findings are suggestive for scarring. Heart size is upper limits of normal but unchanged. Negative for a pneumothorax. Stable large calcification in the right upper abdomen. No acute bone abnormality. IMPRESSION: No acute findings. Scarring and/or atelectasis in the left lung. Electronically Signed   By: Richarda Overlie M.D.   On: 09/29/2015 18:14   Dg Ribs Unilateral Right  09/30/2015  CLINICAL DATA:  Pain, fell EXAM: RIGHT RIBS - 2 VIEW COMPARISON:  09/29/2015 FINDINGS: Stable cardiomegaly. Tortuous atheromatous aorta. Some increase in coarse left infrahilar atelectasis or infiltrates. No effusion. No pneumothorax. Spondylitic changes in the lower thoracic spine. Minimally displaced fracture, anterolateral aspect right fifth rib. Old anterolateral right ninth rib fracture with callus. IMPRESSION: 1. Right fifth rib fracture without pneumothorax. Electronically Signed   By: Corlis Leak M.D.   On: 09/30/2015 14:58   Dg Pelvis 1-2 Views  09/29/2015  CLINICAL DATA:  Fall. EXAM: PELVIS - 1-2 VIEW COMPARISON:  12/18/2014 FINDINGS: Again noted are bilateral hip replacements which are grossly intact. Surgical changes in the right pelvic region. Numerous pelvic calcifications are similar to the prior examination. Degenerative disc disease in the lower lumbar spine. Question old fractures involving the left pubic rami. Pelvic bony ring appears to be intact without an acute fracture. The femoral prostheses are incompletely imaged. IMPRESSION: No acute abnormality in the pelvis. Electronically Signed   By: Richarda Overlie M.D.   On: 09/29/2015 18:17    Ct Head Wo Contrast  09/29/2015  CLINICAL DATA:  Status post fall with a blow to the left side of the head. Anticoagulated patient. Initial encounter. EXAM: CT HEAD WITHOUT CONTRAST CT CERVICAL SPINE WITHOUT CONTRAST TECHNIQUE: Multidetector CT imaging of the head and cervical spine was performed following the standard protocol without intravenous contrast. Multiplanar CT image reconstructions of the cervical spine were also generated. COMPARISON:  Head CT scan 12/18/2014. Head and cervical spine CT scan 11/02/2013. 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 No evidence of acute intracranial abnormality including hemorrhage, infarct, mass lesion, mass effect, midline shift or abnormal extra-axial fluid collection is identified. There is no hydrocephalus or pneumocephalus. The calvarium is intact. Imaged paranasal sinuses and mastoid air cells are clear. CT CERVICAL SPINE FINDINGS No fracture is identified. Trace anterolisthesis C4 on C5 due to facet arthropathy is noted. Loss of disc space height appears worst at C5-6 and C6-7. Lung apices are clear. IMPRESSION: No acute abnormality head or cervical spine. Atrophy and chronic microvascular ischemic change. Cervical spondylosis. Electronically Signed   By: Drusilla Kanner M.D.   On: 09/29/2015 18:20   Ct Cervical Spine Wo Contrast  09/29/2015  CLINICAL DATA:  Status post fall with a blow to the left side of the head. Anticoagulated patient. Initial encounter. EXAM: CT HEAD WITHOUT CONTRAST CT CERVICAL SPINE WITHOUT CONTRAST TECHNIQUE: Multidetector CT imaging of the head and cervical spine was performed following the standard protocol without intravenous contrast. Multiplanar CT image reconstructions  of the cervical spine were also generated. COMPARISON:  Head CT scan 12/18/2014. Head and cervical spine CT scan 11/02/2013. 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 No evidence of acute intracranial abnormality including hemorrhage, infarct, mass lesion, mass effect, midline shift or abnormal extra-axial fluid collection is identified. There is no hydrocephalus or pneumocephalus. The calvarium is intact. Imaged paranasal sinuses and mastoid air cells are clear. CT CERVICAL SPINE FINDINGS No fracture is identified. Trace anterolisthesis C4 on C5 due to facet arthropathy is noted. Loss of disc space height appears worst at C5-6 and C6-7. Lung apices are clear. IMPRESSION: No acute abnormality head or cervical spine. Atrophy and chronic microvascular ischemic change. Cervical spondylosis. Electronically Signed   By: Drusilla Kanner M.D.   On: 09/29/2015 18:20   Ct Pelvis Wo Contrast  09/30/2015  CLINICAL DATA:  Status post fall on patient's right side with bruising on clinical exam. Patient is on anticoagulation therapy. EXAM: CT PELVIS WITHOUT CONTRAST TECHNIQUE: Multidetector CT imaging of the pelvis was performed following the standard protocol without intravenous contrast. COMPARISON:  Radiograph of the pelvis dated 09/27/2015 FINDINGS: There is diffuse osteopenia. There is no evidence of pelvic or hip fracture. Bilateral hip prostheses are intact and in good alignment. Osteoarthritic changes are seen of the lower lumbosacral spine with disc space narrowing, vacuum phenomenon, remodeling of the vertebral bodies. Similar in severity changes seen in the posterior elements. There are changes from right anterior wall hernia repair. Soft tissues are otherwise grossly unremarkable. IMPRESSION: No evidence of fracture of the pelvis or hips, status post bilateral hip arthroplasty. Osteopenia. 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 Electronically Signed   By: Ted Mcalpine M.D.   On: 09/30/2015 14:53    EKG:    Orders placed or performed during the hospital encounter of 09/29/15  . EKG 12-Lead  . EKG 12-Lead    ASSESSMENT AND PLAN:   74 year old female with history of COPD on 3 L home oxygen, hypertension, atrial fibrillation on Coumadin, history of fall with bilateral hip replacement surgeries done a few months ago, at baseline walks with a walker presents to the hospital after a fall.  #1 fall and right hip pain- no fractures noted. Mechanical fall. -Due to persistent right hip pain, and history of osteoporosis, a CT of the pelvis and hip ordered. No fracture noted on the CT as well. -Adjust pain medications and encourage ambulation with physical therapy. -Initial evaluation recommended rehabilitation. might have to reevaluate tomorrow  #2 atrial fibrillation-chronic. -Continue atenolol. On warfarin for anticoagulation. Pharmacy to adjust the dose. -Also on Cardizem.  #3 COPD on 3 L home oxygen-appears stable. Continue her home inhalers. -On 3 L O2 now.  #4 hypokalemia-continue with potassium replacement.  #5 DVT Prophylaxis- on warfarin    All the records are reviewed and case discussed with Care Management/Social Workerr. Management plans discussed with the patient, family and they are in agreement.  CODE STATUS: Full Code  TOTAL TIME TAKING CARE OF THIS PATIENT:  37 minutes.   POSSIBLE D/C IN 2 DAYS, DEPENDING ON CLINICAL CONDITION.   Enid Baas M.D on 09/30/2015 at 3:17 PM  Between 7am to 6pm - Pager - [phone]  After 6pm go to [email or web address] - password [email or web address] [facility]  [facility]  Office  [phone]  CC: Primary care physician; Joanna Hews, MD

## 2015-09-30 NOTE — Care Management Obs Status (Signed)
MEDICARE OBSERVATION STATUS NOTIFICATION   Patient Details  Name: Maureen Wilson MRN: [id number] Date of Birth: [birth year]   Medicare Observation Status Notification Given:  Yes    Gwenette GreetBrenda S Johnanthony Wilden, RN 09/30/2015, 8:28 AM

## 2015-09-30 NOTE — Evaluation (Signed)
Physical Therapy Evaluation Patient Details Name: Maureen Wilson MRN: [id number] DOB: 02/09/1941 Today's Date: 09/30/2015   History of Present Illness  Patient is a 74 y/o female that presents s/p unwitnessed fall at home. Patient is on 3L of O2 at baseline.   Clinical Impression  Patient reports 10/10 in multiple body locations and is generally lethargic and pain limiting. She initially agrees to attempt transfer supine to sit, however on 2 attempts she begins moaning/yelling in pain in her chest initially then in her R shoulder and R hip. Patient then asks not to attempt mobility secondary to pain, in the two attempts she was unable to provide any effort to assist with mobility. At this time she is unable to complete bed mobility or even attempt OOB transfers and would benefit from short term rehabilitation to to increase her mobility. Pain noted in this evaluation was quite difficult to follow as it was in multiple body locations, she was able to complete AAROM heel slides on R side, R hip abduction was quite painful.     Follow Up Recommendations SNF    Equipment Recommendations  Wheelchair (measurements PT) (Lift? Bed pan)    Recommendations for Other Services       Precautions / Restrictions Precautions Precautions: Fall Restrictions Weight Bearing Restrictions: No      Mobility  Bed Mobility               General bed mobility comments: Unable to complete bed mobility as patient begins complaining of pain in shoulder, elbow, chest, mid back, R hip. PT had +2 assistance, on 2 attempts to complete dependent transfer patient began complaining of 10/10 pain initially in her chest, radiating from mid-back, then from R shoulder.   Transfers                    Ambulation/Gait                Stairs            Wheelchair Mobility    Modified Rankin (Stroke Patients Only)       Balance                                              Pertinent Vitals/Pain Pain Assessment: 0-10 Pain Score: 10-Worst pain ever Pain Location: Chest (sternum), mid back, R hip, R shoulder/elbow Pain Descriptors / Indicators: Aching;Constant    Home Living Family/patient expects to be discharged to:: Private residence Living Arrangements: Spouse/significant other;Children Available Help at Discharge: Family;Available 24 hours/day Type of Home: House Home Access: Ramped entrance     Home Layout: One level Home Equipment: Walker - 2 wheels;Wheelchair - Lawyer Comments: Per daughter they have handicapped accessible "everything"     Prior Function Level of Independence: Independent with assistive device(s)         Comments: Patient is very limited in ambulation, it sounds like she typically only ambulates with observation from family member and RW.      Hand Dominance        Extremity/Trunk Assessment   Upper Extremity Assessment: RUE deficits/detail;LUE deficits/detail RUE Deficits / Details: Patient begins yelling in pain with attempt for +2 assistance to transfer supine <--> sit.  RUE: Unable to fully assess due to pain       Lower Extremity Assessment: RLE deficits/detail;LLE deficits/detail RLE Deficits /  Details: Able to complete AAROM heel slide (increased pain with passive hip abduction) LLE Deficits / Details: Able to complete AROM heel slide      Communication   Communication: No difficulties  Cognition Arousal/Alertness: Awake/alert;Lethargic Behavior During Therapy: WFL for tasks assessed/performed Overall Cognitive Status: Within Functional Limits for tasks assessed                      General Comments      Exercises        Assessment/Plan    PT Assessment Patient needs continued PT services  PT Diagnosis Difficulty walking;Abnormality of gait;Generalized weakness;Acute pain   PT Problem List Decreased strength;Decreased mobility;Pain;Decreased  balance;Decreased knowledge of use of DME;Cardiopulmonary status limiting activity;Decreased activity tolerance;Obesity  PT Treatment Interventions DME instruction;Therapeutic activities;Therapeutic exercise;Gait training;Stair training;Balance training   PT Goals (Current goals can be found in the Care Plan section) Acute Rehab PT Goals Patient Stated Goal: To decrease her pain  PT Goal Formulation: With patient/family Time For Goal Achievement: 10/14/15 Potential to Achieve Goals: Fair    Frequency Min 2X/week   Barriers to discharge Inaccessible home environment Patient is really unable to move out of bed at this time.     Co-evaluation               End of Session Equipment Utilized During Treatment: Oxygen Activity Tolerance: Patient limited by pain Patient left: in bed;with bed alarm set;with call bell/phone within reach Nurse Communication: Mobility status    Functional Assessment Tool Used: Clinical judgement  Functional Limitation: Mobility: Walking and moving around Mobility: Walking and Moving Around Current Status (W0981(G8978): 100 percent impaired, limited or restricted Mobility: Walking and Moving Around Goal Status (X9147(G8979): At least 80 percent but less than 100 percent impaired, limited or restricted    Time: 0852-0915 PT Time Calculation (min) (ACUTE ONLY): 23 min   Charges:   PT Evaluation $Initial PT Evaluation Tier I: 1 Procedure     PT G Codes:   PT G-Codes **NOT FOR INPATIENT CLASS** Functional Assessment Tool Used: Clinical judgement  Functional Limitation: Mobility: Walking and moving around Mobility: Walking and Moving Around Current Status (W2956(G8978): 100 percent impaired, limited or restricted Mobility: Walking and Moving Around Goal Status (O1308(G8979): At least 80 percent but less than 100 percent impaired, limited or restricted   Kerin RansomPatrick A Jermane Brayboy, PT, DPT    09/30/2015, 11:09 AM

## 2015-09-30 NOTE — Care Management (Signed)
Admitted to [facility] after a fall at home. Lives with husband and daughter. Daughter is Selinda OrionSherrie Merritt [phone]), Last seen Dr. Arlana Pouchate about a month ago. [facility] last February-March. [facility] following rehab. Uses a rolling walker to aid in ambulation. Home oxygen thru [facility] 3 liters per nasal cannula. Baths and feeds herself, but needs help with dressing. Daughter helps with errand. [facility] in [place]. States that her mother has Medicare part D, but medicare doesn't help with the inhalers like in the past. Medication Management application given to daughter. Daughter will transport. Gwenette GreetBrenda S Haile Toppins RN MSN CCM Care Mangement [phone]

## 2015-09-30 NOTE — Clinical Social Work Note (Signed)
Clinical Social Work Assessment  Patient Details  Name: Maureen Wilson MRN: [id number] Date of Birth: October 07, 1941  Date of referral:  09/30/15               Reason for consult:  Facility Placement                Permission sought to share information with:  Family Supports Permission granted to share information::  Yes, Verbal Permission Granted  Name::     daughter and son   Housing/Transportation Living arrangements for the past 2 months:  Single Family Home Source of Information:  Patient, Adult Children Patient Interpreter Needed:  None Criminal Activity/Legal Involvement Pertinent to Current Situation/Hospitalization:  No - Comment as needed Significant Relationships:  Adult Children Lives with:  Adult Children Do you feel safe going back to the place where you live?  Yes Need for family participation in patient care:  Yes (Comment)  Care giving concerns:  No care giving concerns identified.   Social Worker assessment / plan:  CSW consulted for possible SNF. CSW met with pt and family to address consult. CSW introduced herself and explained role of social work. CSW also explained process of discharging to SNF while under Medicare OBS. Pt stated that she could not pay privately at a SNF for STR. Pt lives with daughter and would like home health services. Pt's family have DME at home. CSW updated RNCM. CSW is signing off as no further needs identified.   Employment status:  Retired Forensic scientist:  Medicare ([place]) PT Recommendations:  [place] / Referral to community resources:  Other (Comment Required) (Home Health)  Patient/Family's Response to care:  Pt and pt's family were appreciative of CSW support.  Patient/Family's Understanding of and Emotional Response to Diagnosis, Current Treatment, and Prognosis:  Pt and family are unable to pay for SNF privately and understand that she still needs PT.   Emotional  Assessment Appearance:  Appears older than stated age Attitude/Demeanor/Rapport:  Other (Appropriate) Affect (typically observed):  Accepting, Pleasant Orientation:  Oriented to  Time, Oriented to Situation Alcohol / Substance use:  Never Used Psych involvement (Current and /or in the community):   No  Discharge Needs  Concerns to be addressed:  No discharge needs identified Readmission within the last 30 days:  Yes Current discharge risk:  None Barriers to Discharge:  No Barriers Identified   Darden Dates, LCSW 09/30/2015, 4:23 PM

## 2015-09-30 NOTE — Progress Notes (Signed)
Physical Therapy Treatment Patient Details Name: Maureen Wilson MRN: [id number] DOB: 12-26-1940 Today's Date: 09/30/2015    History of Present Illness Patient is a 74 y/o female that presents s/p unwitnessed fall at home. Patient is on 3L of O2 at baseline.     PT Comments    Patient seen again at request of family as patient does not currently qualify for SNF placement. Patient is able to perform bed mobility and transfers at roughly her baseline levels. She fatigues very quickly with ambulation and begins to panic and descend toward the floor on return trip from bathroom. Given this near fall and her poor gait mechanics with RW, PT recommended to family to have patient in wheelchair for any mobility >10 feet or to have wheelchair follow behind for mobility with RW. Family has a recliner with lift attachment and bedside commode, which PT recommended family use for toileting currently. PT would still recommend SNF placement after discharge, however if family were to return home, patient will require 24/7 assistance and is not safe for any non-wheelchair mobility without assistance. All toileting would need to be via BSC or bed pan. PT made family aware of this, which they acknowledged.   Follow Up Recommendations  SNF (Family cannot afford SNF, will need 24/7 care. Please see assessment. )     Equipment Recommendations  Wheelchair (measurements PT)    Recommendations for Other Services       Precautions / Restrictions Precautions Precautions: Fall Restrictions Weight Bearing Restrictions: No    Mobility  Bed Mobility Overal bed mobility: Needs Assistance Bed Mobility: Supine to Sit;Sit to Supine     Supine to sit: Min assist Sit to supine: Mod assist;+2 for physical assistance   General bed mobility comments: Patient is able to use hand rails with UEs and slide LLE off EOB, requires assistance for RLE. Upon returning to bedside, patient is quite fatigued and only  minimally able to participate in transfer.   Transfers Overall transfer level: Needs assistance Equipment used: Rolling walker (2 wheeled) Transfers: Sit to/from Stand Sit to Stand: Min guard         General transfer comment: Patient is able to transfer from both bed and toilet without external assistance, though quite slowly.   Ambulation/Gait Ambulation/Gait assistance: Min guard;Min assist Ambulation Distance (Feet): 20 Feet Assistive device: Rolling walker (2 wheeled) Gait Pattern/deviations: Trunk flexed;Shuffle;Step-through pattern     General Gait Details: Patient pushes walker anteriorly and requires PT to pull posteriorly on walker to maintain safety, patient is able to take very slow reciprocal steps without assistance (with RW). She fatigues very quickly and begins to call out that she will fall. PT brought bed to patient and assisted in turning to sit on bedside.    Stairs            Wheelchair Mobility    Modified Rankin (Stroke Patients Only)       Balance Overall balance assessment: Needs assistance Sitting-balance support: Bilateral upper extremity supported Sitting balance-Leahy Scale: Fair Sitting balance - Comments: No balance deficits in sitting.    Standing balance support: Bilateral upper extremity supported Standing balance-Leahy Scale: Poor Standing balance comment: Patient requires hands on assistance to keep RW from going forwards and requires PT to bring bed to her on return from bathroom as she begins to descend towards floor.                     Cognition Arousal/Alertness: Awake/alert;Lethargic Behavior During Therapy:  WFL for tasks assessed/performed Overall Cognitive Status: Within Functional Limits for tasks assessed                      Exercises      General Comments        Pertinent Vitals/Pain Pain Assessment:  (Patient reports less pain and willingness to participate in session. Patient reports she has  minimal pain with RLE during weight bearing.)    Home Living                      Prior Function            PT Goals (current goals can now be found in the care plan section) Acute Rehab PT Goals Patient Stated Goal: To decrease her pain  PT Goal Formulation: With patient/family Time For Goal Achievement: 10/14/15 Potential to Achieve Goals: Fair Progress towards PT goals: Progressing toward goals    Frequency  Min 2X/week    PT Plan Current plan remains appropriate    Co-evaluation             End of Session Equipment Utilized During Treatment: Oxygen Activity Tolerance: Patient limited by fatigue Patient left: in bed;with bed alarm set;with call bell/phone within reach;with family/visitor present     Time: 1425-1451 PT Time Calculation (min) (ACUTE ONLY): 26 min  Charges:  $Gait Training: 8-22 mins $Therapeutic Activity: 8-22 mins                    G Codes:      Kerin Ransom, PT, DPT    09/30/2015, 5:05 PM

## 2015-09-30 NOTE — Progress Notes (Signed)
ANTICOAGULATION CONSULT NOTE - Initial Consult  Pharmacy Consult for warfarin  Indication: atrial fibrillation  Allergies  Allergen Reactions  . Amlodipine Other (See Comments)    10mg  causes swelling  . Hydralazine Other (See Comments)    Lip/mouth swelling  . Iohexol      Code: HIVES, Desc: pt developed hives after receiving iv dye for ct scan.  suggest she be premedicated for future exams, Onset Date: [id number]   . Metoprolol Other (See Comments)    Lip/mouth swelling  . Olmesartan Other (See Comments)    Oral edema  . Ramipril Swelling    Oral  . Sulfa Antibiotics Other (See Comments)    angioedema  . Telmisartan Other (See Comments)    Lips/mouth swelling  . Tiotropium Swelling   Patient Measurements: Height: 5\' 3"  (160 cm) Weight: 229 lb 14.4 oz (104.282 kg) IBW/kg (Calculated) : 52.4 Vital Signs: Temp: 97.4 F (36.3 C) (12/23 0449) Temp Source: Oral (12/23 0449) BP: 112/76 mmHg (12/23 0449) Pulse Rate: 74 (12/23 0449)  Recent Labs  09/29/15 1731 09/29/15 1825 09/29/15 2049 09/30/15 0637  HGB 12.3  --   --  11.2*  HCT 38.3  --   --  35.0  PLT 212  --   --  176  APTT  --   --   --  38*  LABPROT  --  21.9*  --   --   INR  --  1.92  --   --   CREATININE 2.15*  --   --  2.22*  TROPONINI <0.03  --  <0.03  --    Estimated Creatinine Clearance: 25.7 mL/min (by C-G formula based on Cr of 2.22).  Medical History: Past Medical History  Diagnosis Date  . COPD (chronic obstructive pulmonary disease) (HCC)   . Hypertension   . Atrial fibrillation (HCC)   . CHF (congestive heart failure) (HCC)   . Chronic kidney disease     Assessment: 74 yo female with a PMH of A.Fib. Has been taking warfarin 2mg  daily at home. Pharmacy now consulted for dosing and monitoring of warfarin dose.  Patients INR on admission (12/22) was 1.92. Patient did not receive a dose on 12/22, last reported dose was 12/21.  Goal of Therapy:  INR 2-3 Monitor platelets by anticoagulation  protocol: Yes   Plan:  Due to subtherapeutic INR and missed dose on 12/22, will give 1 time dose of 3 mg tonight.  Recommend starting patient back on 2mg  daily tomorrow.  PT/INR ordered with AM labs. Pharmacy will continue to follow and adjust as needed.   Maureen NakaiSheema Aayansh Wilson, PharmD Pharmacy Resident  09/30/2015,2:47 PM

## 2015-10-01 ENCOUNTER — Observation Stay: Payer: Medicare Other

## 2015-10-01 DIAGNOSIS — W010XXA Fall on same level from slipping, tripping and stumbling without subsequent striking against object, initial encounter: Secondary | ICD-10-CM | POA: Diagnosis present

## 2015-10-01 DIAGNOSIS — R791 Abnormal coagulation profile: Secondary | ICD-10-CM | POA: Diagnosis present

## 2015-10-01 DIAGNOSIS — Z9981 Dependence on supplemental oxygen: Secondary | ICD-10-CM | POA: Diagnosis not present

## 2015-10-01 DIAGNOSIS — M791 Myalgia: Secondary | ICD-10-CM | POA: Diagnosis present

## 2015-10-01 DIAGNOSIS — R1310 Dysphagia, unspecified: Secondary | ICD-10-CM | POA: Diagnosis not present

## 2015-10-01 DIAGNOSIS — J449 Chronic obstructive pulmonary disease, unspecified: Secondary | ICD-10-CM | POA: Diagnosis present

## 2015-10-01 DIAGNOSIS — Z7951 Long term (current) use of inhaled steroids: Secondary | ICD-10-CM | POA: Diagnosis not present

## 2015-10-01 DIAGNOSIS — T402X1A Poisoning by other opioids, accidental (unintentional), initial encounter: Secondary | ICD-10-CM | POA: Diagnosis not present

## 2015-10-01 DIAGNOSIS — J690 Pneumonitis due to inhalation of food and vomit: Secondary | ICD-10-CM | POA: Diagnosis not present

## 2015-10-01 DIAGNOSIS — E876 Hypokalemia: Secondary | ICD-10-CM | POA: Diagnosis present

## 2015-10-01 DIAGNOSIS — Z8249 Family history of ischemic heart disease and other diseases of the circulatory system: Secondary | ICD-10-CM | POA: Diagnosis not present

## 2015-10-01 DIAGNOSIS — Z79899 Other long term (current) drug therapy: Secondary | ICD-10-CM | POA: Diagnosis not present

## 2015-10-01 DIAGNOSIS — N189 Chronic kidney disease, unspecified: Secondary | ICD-10-CM | POA: Diagnosis present

## 2015-10-01 DIAGNOSIS — Z7901 Long term (current) use of anticoagulants: Secondary | ICD-10-CM | POA: Diagnosis not present

## 2015-10-01 DIAGNOSIS — Z96643 Presence of artificial hip joint, bilateral: Secondary | ICD-10-CM | POA: Diagnosis present

## 2015-10-01 DIAGNOSIS — R0681 Apnea, not elsewhere classified: Secondary | ICD-10-CM | POA: Diagnosis not present

## 2015-10-01 DIAGNOSIS — M810 Age-related osteoporosis without current pathological fracture: Secondary | ICD-10-CM | POA: Diagnosis present

## 2015-10-01 DIAGNOSIS — N179 Acute kidney failure, unspecified: Secondary | ICD-10-CM | POA: Diagnosis present

## 2015-10-01 DIAGNOSIS — M25551 Pain in right hip: Secondary | ICD-10-CM | POA: Diagnosis present

## 2015-10-01 DIAGNOSIS — Z882 Allergy status to sulfonamides status: Secondary | ICD-10-CM | POA: Diagnosis not present

## 2015-10-01 DIAGNOSIS — K5900 Constipation, unspecified: Secondary | ICD-10-CM | POA: Diagnosis present

## 2015-10-01 DIAGNOSIS — I5022 Chronic systolic (congestive) heart failure: Secondary | ICD-10-CM | POA: Diagnosis present

## 2015-10-01 DIAGNOSIS — I340 Nonrheumatic mitral (valve) insufficiency: Secondary | ICD-10-CM | POA: Diagnosis present

## 2015-10-01 DIAGNOSIS — I482 Chronic atrial fibrillation: Secondary | ICD-10-CM | POA: Diagnosis present

## 2015-10-01 DIAGNOSIS — Y92000 Kitchen of unspecified non-institutional (private) residence as  the place of occurrence of the external cause: Secondary | ICD-10-CM | POA: Diagnosis not present

## 2015-10-01 DIAGNOSIS — G4733 Obstructive sleep apnea (adult) (pediatric): Secondary | ICD-10-CM | POA: Diagnosis present

## 2015-10-01 DIAGNOSIS — E871 Hypo-osmolality and hyponatremia: Secondary | ICD-10-CM | POA: Diagnosis present

## 2015-10-01 DIAGNOSIS — Z888 Allergy status to other drugs, medicaments and biological substances status: Secondary | ICD-10-CM | POA: Diagnosis not present

## 2015-10-01 DIAGNOSIS — I130 Hypertensive heart and chronic kidney disease with heart failure and stage 1 through stage 4 chronic kidney disease, or unspecified chronic kidney disease: Secondary | ICD-10-CM | POA: Diagnosis present

## 2015-10-01 DIAGNOSIS — Z90710 Acquired absence of both cervix and uterus: Secondary | ICD-10-CM | POA: Diagnosis not present

## 2015-10-01 DIAGNOSIS — R262 Difficulty in walking, not elsewhere classified: Secondary | ICD-10-CM | POA: Diagnosis present

## 2015-10-01 DIAGNOSIS — R410 Disorientation, unspecified: Secondary | ICD-10-CM | POA: Diagnosis present

## 2015-10-01 LAB — PROTIME-INR
INR: 1.79
Prothrombin Time: 20.8 seconds — ABNORMAL HIGH (ref 11.4–15.0)

## 2015-10-01 LAB — BLOOD GAS, ARTERIAL
Acid-Base Excess: 10.7 mmol/L — ABNORMAL HIGH (ref 0.0–3.0)
Allens test (pass/fail): POSITIVE — AB
BICARBONATE: 35.7 meq/L — AB (ref 21.0–28.0)
FIO2: 0.32
O2 SAT: 92.7 %
PATIENT TEMPERATURE: 37
PCO2 ART: 48 mmHg (ref 32.0–48.0)
PO2 ART: 61 mmHg — AB (ref 83.0–108.0)
pH, Arterial: 7.48 — ABNORMAL HIGH (ref 7.350–7.450)

## 2015-10-01 LAB — BASIC METABOLIC PANEL
Anion gap: 11 (ref 5–15)
BUN: 39 mg/dL — AB (ref 6–20)
CALCIUM: 8.9 mg/dL (ref 8.9–10.3)
CO2: 34 mmol/L — ABNORMAL HIGH (ref 22–32)
Chloride: 84 mmol/L — ABNORMAL LOW (ref 101–111)
Creatinine, Ser: 2.14 mg/dL — ABNORMAL HIGH (ref 0.44–1.00)
GFR calc Af Amer: 25 mL/min — ABNORMAL LOW (ref >60)
GFR, EST NON AFRICAN AMERICAN: 22 mL/min — AB (ref >60)
GLUCOSE: 134 mg/dL — AB (ref 65–99)
Potassium: 3.6 mmol/L (ref 3.5–5.1)
Sodium: 129 mmol/L — ABNORMAL LOW (ref 135–145)

## 2015-10-01 LAB — GLUCOSE, CAPILLARY
GLUCOSE-CAPILLARY: 149 mg/dL — AB (ref 65–99)
Glucose-Capillary: 105 mg/dL — ABNORMAL HIGH (ref 65–99)
Glucose-Capillary: 151 mg/dL — ABNORMAL HIGH (ref 65–99)
Glucose-Capillary: 149 mg/dL — ABNORMAL HIGH (ref 65–99)

## 2015-10-01 LAB — CBC
HCT: 33.8 % — ABNORMAL LOW (ref 35.0–47.0)
Hemoglobin: 11.1 g/dL — ABNORMAL LOW (ref 12.0–16.0)
MCH: 30.1 pg (ref 26.0–34.0)
MCHC: 32.7 g/dL (ref 32.0–36.0)
MCV: 92 fL (ref 80.0–100.0)
PLATELETS: 147 10*3/uL — AB (ref 150–440)
RBC: 3.68 MIL/uL — ABNORMAL LOW (ref 3.80–5.20)
RDW: 15.5 % — AB (ref 11.5–14.5)
WBC: 12 10*3/uL — ABNORMAL HIGH (ref 3.6–11.0)

## 2015-10-01 MED ORDER — PIPERACILLIN-TAZOBACTAM 4.5 G IVPB
4.5000 g | Freq: Three times a day (TID) | INTRAVENOUS | Status: DC
  Administered 2015-10-01 – 2015-10-03 (×6): 4.5 g via INTRAVENOUS
  Filled 2015-10-01 (×9): qty 100

## 2015-10-01 MED ORDER — NALOXONE HCL 0.4 MG/ML IJ SOLN
0.4000 mg | Freq: Once | INTRAMUSCULAR | Status: AC
  Administered 2015-10-01: 10:00:00 0.4 mg via INTRAVENOUS
  Filled 2015-10-01: qty 1

## 2015-10-01 MED ORDER — ALBUTEROL SULFATE (2.5 MG/3ML) 0.083% IN NEBU
3.0000 mL | INHALATION_SOLUTION | Freq: Four times a day (QID) | RESPIRATORY_TRACT | Status: DC
  Administered 2015-10-01 – 2015-10-05 (×15): 3 mL via RESPIRATORY_TRACT
  Filled 2015-10-01 (×17): qty 3

## 2015-10-01 MED ORDER — WARFARIN SODIUM 2 MG PO TABS
4.0000 mg | ORAL_TABLET | Freq: Once | ORAL | Status: AC
  Administered 2015-10-01: 17:00:00 4 mg via ORAL
  Filled 2015-10-01: qty 2

## 2015-10-01 NOTE — Progress Notes (Signed)
ANTIBIOTIC CONSULT NOTE - INITIAL  Pharmacy Consult for Zosyn  Indication: rule out sepsis  Allergies  Allergen Reactions  . Amlodipine Other (See Comments)     causes swelling  . Hydralazine Other (See Comments)    Lip/mouth swelling  . Iohexol      Code: HIVES, Desc: pt developed hives after receiving iv dye for ct scan.  suggest she be premedicated for future exams, Onset Date: [id number]   . Metoprolol Other (See Comments)    Lip/mouth swelling  . Olmesartan Other (See Comments)    Oral edema  . Ramipril Swelling    Oral  . Sulfa Antibiotics Other (See Comments)    angioedema  . Telmisartan Other (See Comments)    Lips/mouth swelling  . Tiotropium Swelling    Patient Measurements: Height:  (160 cm) Weight: 233 lb 14.4 oz (106.096 kg) IBW/kg (Calculated) : 52.4 Adjusted Body Weight:   Vital Signs: Temp: 97.4 F (36.3 C) (12/24 1030) Temp Source: Oral (12/24 1030) BP: 127/77 mmHg (12/24 1030) Pulse Rate: 94 (12/24 1030) Intake/Output from previous day: 12/23 0701 - 12/24 0700 In: 480 [P.O.:480] Out: -  Intake/Output from this shift:    Labs:  Recent Labs  09/29/15 1731 09/30/15 0637 10/01/15 0420  WBC 13.5* 11.2* 12.0*  HGB 12.3 11.2* 11.1*  PLT 212 176 147*  CREATININE 2.15* 2.22* 2.14*   Estimated Creatinine Clearance: 26.9 mL/min (by C-G formula based on Cr of 2.14). No results for input(s): VANCOTROUGH, VANCOPEAK, VANCORANDOM, GENTTROUGH, GENTPEAK, GENTRANDOM, TOBRATROUGH, TOBRAPEAK, TOBRARND, AMIKACINPEAK, AMIKACINTROU, AMIKACIN in the last 72 hours.   Microbiology: Recent Results (from the past 720 hour(s))  MRSA PCR Screening     Status: Abnormal   Collection Time: 09/30/15 10:02 AM  Result Value Ref Range Status   MRSA by PCR POSITIVE (A) NEGATIVE Final    Comment: CRITICAL RESULT CALLED TO, READ BACK BY AND VERIFIED WITH: SHAYE BREWER [id number] Sep 30 2015.sfw        The GeneXpert MRSA Assay (FDA approved for NASAL specimens only),  is one component of a comprehensive MRSA colonization surveillance program. It is not intended to diagnose MRSA infection nor to guide or monitor treatment for MRSA infections.     Medical History: Past Medical History  Diagnosis Date  . COPD (chronic obstructive pulmonary disease) (HCC)   . Hypertension   . Atrial fibrillation (HCC)   . CHF (congestive heart failure) (HCC)   . Chronic kidney disease     Medications:  Prescriptions prior to admission  Medication Sig Dispense Refill Last Dose  . albuterol (PROVENTIL) (2.5 MG/3ML) 0.083% nebulizer solution Inhale 3 mLs into the lungs every 6 (six) hours as needed.   prn at prn  . albuterol-ipratropium (COMBIVENT) 18-103 MCG/ACT inhaler Inhale 1-2 puffs into the lungs every 4 (four) hours.   prn at prn  . ALPRAZolam (XANAX) 0.25 MG tablet Take 0.25 mg by mouth 3 (three) times daily as needed.    09/29/2015 at Unknown time  . aspirin EC 81 MG tablet Take 81 mg by mouth daily.   09/29/2015 at Unknown time  . atenolol (TENORMIN) 50 MG tablet Take 0.5 tablets by mouth 2 (two) times daily.   09/29/2015 at Unknown time  . atorvastatin (LIPITOR) 20 MG tablet Take 1 tablet by mouth daily.   09/29/2015 at Unknown time  . benzonatate (TESSALON) 100 MG capsule Take 1 capsule by mouth 2 (two) times daily as needed.   prn at prn  . budesonide-formoterol (SYMBICORT) 160-4.5  MCG/ACT inhaler Inhale 2 puffs into the lungs 2 (two) times daily.   09/29/2015 at Unknown time  . cetirizine (ZYRTEC) 10 MG tablet Take 1 tablet by mouth daily.   09/29/2015 at Unknown time  . Cholecalciferol (D 2000) 2000 UNITS TABS Take 2,000 Units by mouth daily.   09/29/2015 at Unknown time  . clonazePAM (KLONOPIN) 0.5 MG tablet Take 0.5-1 tablets by mouth 3 (three) times daily. [facility] tablet [facility] and the 1 tablet at bedtime   09/29/2015 at Unknown time  . diltiazem (CARDIZEM CD) 180 MG 24 hr capsule Take 180 mg by mouth daily.    09/29/2015 at Unknown time   . escitalopram (LEXAPRO) 10 MG tablet Take 10 mg by mouth daily.    09/29/2015 at Unknown time  . esomeprazole (NEXIUM) 40 MG capsule Take by mouth 2 (two) times daily.   09/29/2015 at Unknown time  . fluticasone (FLONASE) 50 MCG/ACT nasal spray Place 2 sprays into the nose daily.    09/29/2015 at Unknown time  . Fluticasone-Salmeterol (ADVAIR) 250-50 MCG/DOSE AEPB Inhale 1 puff into the lungs 2 (two) times daily.   09/29/2015 at Unknown time  . furosemide (LASIX) 40 MG tablet Take by mouth 2 (two) times daily.   09/29/2015 at Unknown time  . levothyroxine (SYNTHROID, LEVOTHROID) 50 MCG tablet Take by mouth. Take 1 tablet daily except on Monday and Thursday, take 1.5 tablets   09/29/2015 at Unknown time  . metolazone (ZAROXOLYN) 2.5 MG tablet Take 2.5 mg by mouth 2 (two) times a week. On Monday and Thursday   09/29/2015 at Unknown time  . montelukast (SINGULAIR) 10 MG tablet TAKE ONE TABLET AT BEDTIME.   09/28/2015 at Unknown time  . nystatin (MYCOSTATIN) 100000 UNIT/ML suspension Take 5 mLs by mouth 4 (four) times daily.    09/29/2015 at Unknown time  . ondansetron (ZOFRAN) 4 MG tablet Take 4 mg by mouth every 8 (eight) hours as needed.    prn at prn  . potassium chloride SA (K-DUR,KLOR-CON) 20 MEQ tablet Take 20 mEq by mouth daily.    09/29/2015 at Unknown time  . predniSONE (DELTASONE) 5 MG tablet Take 5 mg by mouth daily with breakfast.   09/29/2015 at Unknown time  . risperiDONE (RISPERDAL) 0.5 MG tablet Take 0.5 mg by mouth daily.    09/29/2015 at Unknown time  . tamsulosin (FLOMAX) 0.4 MG CAPS capsule Take 0.4 mg by mouth daily.    09/29/2015 at Unknown time  . traMADol (ULTRAM) 50 MG tablet Take 50 mg by mouth every 6 (six) hours as needed.    prn at prn  . warfarin (COUMADIN) 2 MG tablet TAKE ONE (1) TABLET BY MOUTH ONCE DAILY   09/28/2015 at Unknown time   Assessment: CrCl = 26.9 ml/min Pseudomonas risk factors :  Prior use of prednisone at home.   Goal of Therapy:  resolution of  infection  Plan:  Expected duration 7 days with resolution of temperature and/or normalization of WBC   Zosyn 4.5 gm IV Q8H EI ordered to start 12/24 .   Rolla Servidio D 10/01/2015,1:04 PM

## 2015-10-01 NOTE — Progress Notes (Signed)
Patient's daughter gave patient a sip of ginger ale and patient began to choke - O2 sat decreased to 88% on 3L-O2. Dr. Nemiah CommanderKalisetti made aware, SLP eval and treat order placed. Bo McclintockBrewer,Rayona Sardinha S, RN

## 2015-10-01 NOTE — Progress Notes (Signed)
[facility] [place] [facility]   PATIENT NAME: Maureen Wilson    MR#:  [id number]  DATE OF BIRTH:  Oct 31, 1940  SUBJECTIVE:  CHIEF COMPLAINT:   Chief Complaint  Patient presents with  . Chest Pain  . Fall   - Patient had an uneventful light night last night, did not use her CPAP though. -This morning was alert and took her oral medicines according to the nurse. However when daughter was trying to give her ginger ale, patient had an episode of choking. And since then became slightly tachypneic. And got more lethargic. Arousable and following commands  REVIEW OF SYSTEMS:  Review of Systems  Constitutional: Negative for fever and chills.  HENT: Positive for hearing loss. Negative for ear discharge, ear pain and nosebleeds.   Eyes: Negative for blurred vision.  Respiratory: Positive for cough. Negative for shortness of breath and wheezing.   Cardiovascular: Positive for chest pain. Negative for palpitations and leg swelling.       Right sided chest pain from fall  Gastrointestinal: Negative for nausea, vomiting, abdominal pain, diarrhea and constipation.  Genitourinary: Negative for dysuria and urgency.  Musculoskeletal: Positive for myalgias, back pain, joint pain and falls.  Neurological: Negative for dizziness, sensory change, speech change, focal weakness, seizures, weakness and headaches.  Psychiatric/Behavioral: Negative for depression.    DRUG ALLERGIES:   Allergies  Allergen Reactions  . Amlodipine Other (See Comments)     causes swelling  . Hydralazine Other (See Comments)    Lip/mouth swelling  . Iohexol      Code: HIVES, Desc: pt developed hives after receiving iv dye for ct scan.  suggest she be premedicated for future exams, Onset Date: [id number]   . Metoprolol Other (See Comments)    Lip/mouth swelling  . Olmesartan Other (See Comments)    Oral edema  . Ramipril Swelling    Oral  . Sulfa Antibiotics Other (See Comments)     angioedema  . Telmisartan Other (See Comments)    Lips/mouth swelling  . Tiotropium Swelling    VITALS:  Blood pressure 127/77, pulse 94, temperature 97.4 F (36.3 C), temperature source Oral, resp. rate 22, height  (1.6 m), weight 106.096 kg (233 lb 14.4 oz), SpO2 99 %.  PHYSICAL EXAMINATION:  Physical Exam  GENERAL:  74 y.o.-year-old elderly patient lying in the bed with no acute distress.  EYES: Pupils equal, round, reactive to light and accommodation. No scleral icterus. Extraocular muscles intact.  HEENT: Head atraumatic, normocephalic. Oropharynx and nasopharynx clear.  NECK:  Supple, no jugular venous distention. No thyroid enlargement, no tenderness.  LUNGS: Gurgling upper airway noise. Normal breath sounds bilaterally, no wheezing, rales,rhonchi or crepitation. Decreased bibasilar breath sounds. No use of accessory muscles of respiration.  CARDIOVASCULAR: S1, S2 normal. No rubs, or gallops. 3/6 systolic murmur ABDOMEN: Soft, nontender, nondistended. Bowel sounds present. No organomegaly or mass.  EXTREMITIES: No pedal edema, cyanosis, or clubbing. Bruising noted on her right arm Tenderness of right hip laterally, able to move with some guarding due to pain  NEUROLOGIC: Cranial nerves II through XII are intact. Muscle strength 5/5 in all extremities. Sensation intact. Gait not checked.  PSYCHIATRIC: The patient is sleepy, but arousable and following commands. Seems to be oriented 2-3.  SKIN: No obvious rash, lesion, or ulcer.    LABORATORY PANEL:   CBC  Recent Labs Lab 10/01/15 0420  WBC 12.0*  HGB 11.1*  HCT 33.8*  PLT 147*   ------------------------------------------------------------------------------------------------------------------  Chemistries  Recent Labs Lab 09/29/15 1731  10/01/15 0420  NA 130*  < > 129*  K 3.8  < > 3.6  CL 85*  < > 84*  CO2 32  < > 34*  GLUCOSE 134*  < > 134*  BUN 27*  < > 39*  CREATININE 2.15*  < > 2.14*  CALCIUM 9.2   < > 8.9  AST 39  --   --   ALT 26  --   --   ALKPHOS 108  --   --   BILITOT 1.5*  --   --   < > = values in this interval not displayed. ------------------------------------------------------------------------------------------------------------------  Cardiac Enzymes  Recent Labs Lab 09/29/15 2049  TROPONINI <0.03   ------------------------------------------------------------------------------------------------------------------  RADIOLOGY:  Dg Chest 2 View  09/29/2015  CLINICAL DATA:  Recent fall. Hematoma on the head. Patient is on blood thinners. EXAM: CHEST  2 VIEW COMPARISON:  12/18/2014 and 12/19/2014 FINDINGS: Chronic linear densities in the left mid lung and left lower chest. Findings are suggestive for scarring. Heart size is upper limits of normal but unchanged. Negative for a pneumothorax. Stable large calcification in the right upper abdomen. No acute bone abnormality. IMPRESSION: No acute findings. Scarring and/or atelectasis in the left lung. Electronically Signed   By: Richarda Overlie M.D.   On: 09/29/2015 18:14   Dg Ribs Unilateral Right  09/30/2015  CLINICAL DATA:  Pain, fell EXAM: RIGHT RIBS - 2 VIEW COMPARISON:  09/29/2015 FINDINGS: Stable cardiomegaly. Tortuous atheromatous aorta. Some increase in coarse left infrahilar atelectasis or infiltrates. No effusion. No pneumothorax. Spondylitic changes in the lower thoracic spine. Minimally displaced fracture, anterolateral aspect right fifth rib. Old anterolateral right ninth rib fracture with callus. IMPRESSION: 1. Right fifth rib fracture without pneumothorax. Electronically Signed   By: Corlis Leak M.D.   On: 09/30/2015 14:58   Dg Pelvis 1-2 Views  09/29/2015  CLINICAL DATA:  Fall. EXAM: PELVIS - 1-2 VIEW COMPARISON:  12/18/2014 FINDINGS: Again noted are bilateral hip replacements which are grossly intact. Surgical changes in the right pelvic region. Numerous pelvic calcifications are similar to the prior examination.  Degenerative disc disease in the lower lumbar spine. Question old fractures involving the left pubic rami. Pelvic bony ring appears to be intact without an acute fracture. The femoral prostheses are incompletely imaged. IMPRESSION: No acute abnormality in the pelvis. Electronically Signed   By: Richarda Overlie M.D.   On: 09/29/2015 18:17   Ct Head Wo Contrast  09/29/2015  CLINICAL DATA:  Status post fall with a blow to the left side of the head. Anticoagulated patient. Initial encounter. EXAM: CT HEAD WITHOUT CONTRAST CT CERVICAL SPINE WITHOUT CONTRAST TECHNIQUE: Multidetector CT imaging of the head and cervical spine was performed following the standard protocol without intravenous contrast. Multiplanar CT image reconstructions of the cervical spine were also generated. COMPARISON:  Head CT scan 12/18/2014. Head and cervical spine CT scan 11/02/2013. 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 No evidence of acute intracranial abnormality including hemorrhage, infarct, mass lesion, mass effect, midline shift or abnormal extra-axial fluid collection is identified. There is no hydrocephalus or pneumocephalus. The calvarium is intact. Imaged paranasal sinuses and mastoid air cells are clear. CT CERVICAL SPINE FINDINGS No fracture is identified. Trace anterolisthesis C4 on C5 due to facet arthropathy is noted. Loss of disc space height appears worst at C5-6 and C6-7. Lung apices are clear. IMPRESSION: No  acute abnormality head or cervical spine. Atrophy and chronic microvascular ischemic change. Cervical spondylosis. Electronically Signed   By: Drusilla Kanner M.D.   On: 09/29/2015 18:20   Ct Cervical Spine Wo Contrast  09/29/2015  CLINICAL DATA:  Status post fall with a blow to the left side of the head. Anticoagulated patient. Initial encounter. EXAM: CT HEAD WITHOUT CONTRAST CT CERVICAL SPINE WITHOUT  CONTRAST TECHNIQUE: Multidetector CT imaging of the head and cervical spine was performed following the standard protocol without intravenous contrast. Multiplanar CT image reconstructions of the cervical spine were also generated. COMPARISON:  Head CT scan 12/18/2014. Head and cervical spine CT scan 11/02/2013. 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 No evidence of acute intracranial abnormality including hemorrhage, infarct, mass lesion, mass effect, midline shift or abnormal extra-axial fluid collection is identified. There is no hydrocephalus or pneumocephalus. The calvarium is intact. Imaged paranasal sinuses and mastoid air cells are clear. CT CERVICAL SPINE FINDINGS No fracture is identified. Trace anterolisthesis C4 on C5 due to facet arthropathy is noted. Loss of disc space height appears worst at C5-6 and C6-7. Lung apices are clear. IMPRESSION: No acute abnormality head or cervical spine. Atrophy and chronic microvascular ischemic change. Cervical spondylosis. Electronically Signed   By: Drusilla Kanner M.D.   On: 09/29/2015 18:20   Ct Pelvis Wo Contrast  09/30/2015  CLINICAL DATA:  Status post fall on patient's right side with bruising on clinical exam. Patient is on anticoagulation therapy. EXAM: CT PELVIS WITHOUT CONTRAST TECHNIQUE: Multidetector CT imaging of the pelvis was performed following the standard protocol without intravenous contrast. COMPARISON:  Radiograph of the pelvis dated 09/27/2015 FINDINGS: There is diffuse osteopenia. There is no evidence of pelvic or hip fracture. Bilateral hip prostheses are intact and in good alignment. Osteoarthritic changes are seen of the lower lumbosacral spine with disc space narrowing, vacuum phenomenon, remodeling of the vertebral bodies. Similar in severity changes seen in the posterior elements. There are changes from right anterior wall  hernia repair. Soft tissues are otherwise grossly unremarkable. IMPRESSION: No evidence of fracture of the pelvis or hips, status post bilateral hip arthroplasty. Osteopenia. 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 Electronically Signed   By: Ted Mcalpine M.D.   On: 09/30/2015 14:53   Dg Chest Port 1 View  10/01/2015  CLINICAL DATA:  Possible aspiration. EXAM: PORTABLE CHEST 1 VIEW COMPARISON:  09/29/2015 FINDINGS: Cardiac enlargement is identified. There is aortic atherosclerosis noted. No pleural effusion identified. Decreased lung volumes. Atelectasis within the left midlung and left base is identified. IMPRESSION: 1. Decreased lung volumes. 2. Left midlung and left base atelectasis. Electronically Signed   By: Signa Kell M.D.   On: 10/01/2015 11:33    EKG:   Orders placed or performed during the hospital encounter of 09/29/15  . EKG 12-Lead  . EKG 12-Lead    ASSESSMENT AND PLAN:   74 year old female with history of COPD on 3 L home oxygen, hypertension, atrial fibrillation on Coumadin, history of fall with bilateral hip replacement surgeries done a few months ago, at baseline walks with a walker presents to the hospital after a fall.  #1 fall and right hip pain- no fractures noted. Mechanical fall. -Due to persistent right hip pain, and history of osteoporosis, a CT of the pelvis and hip ordered. No fracture noted on the CT as well. -Adjust pain medications and encourage ambulation with  physical therapy. -Initial evaluation recommended rehabilitation. might have to reevaluate tomorrow  #2 possible aspiration pneumonitis-episode of aspiration with ginger ale. - P consult. Neck short patient is alert before feeding her. -Stat ABG and chest x-ray ordered. -Started empirically on Zosyn for now. Encourage incentive spirometer. Aspiration precautions. -Give a dose of Narcan stat for her sleepiness as she received a dose of Percocet for her  pain  #3 atrial fibrillation-chronic. -Continue atenolol. On warfarin for anticoagulation. Pharmacy to adjust the dose. -Also on Cardizem.  #4 COPD on 3 L home oxygen-appears stable. Continue her home inhalers. -On 3 L O2 now.  #5 hypokalemia-continue with potassium replacement.  #6 DVT Prophylaxis- on warfarin  #7 Hyponatremia- hold metolazone for now    All the records are reviewed and case discussed with Care Management/Social Workerr. Management plans discussed with the patient, family and they are in agreement.  CODE STATUS: Full Code  TOTAL TIME TAKING CARE OF THIS PATIENT:  42 minutes.   POSSIBLE D/C IN 2 DAYS, DEPENDING ON CLINICAL CONDITION.   Enid BaasKALISETTI,Diamonte Stavely M.D on 10/01/2015 at 12:17 PM  Between 7am to 6pm - Pager - [phone]  After 6pm go to [email or web address] - password [email or web address] [facility]  [facility]  Office  [phone]  CC: Primary care physician; Joanna HewsATE,ALLEN D, MD

## 2015-10-01 NOTE — Progress Notes (Signed)
ANTICOAGULATION CONSULT NOTE - Initial Consult  Pharmacy Consult for warfarin  Indication: atrial fibrillation  Allergies  Allergen Reactions  . Amlodipine Other (See Comments)    10mg  causes swelling  . Hydralazine Other (See Comments)    Lip/mouth swelling  . Iohexol      Code: HIVES, Desc: pt developed hives after receiving iv dye for ct scan.  suggest she be premedicated for future exams, Onset Date: [id number]   . Metoprolol Other (See Comments)    Lip/mouth swelling  . Olmesartan Other (See Comments)    Oral edema  . Ramipril Swelling    Oral  . Sulfa Antibiotics Other (See Comments)    angioedema  . Telmisartan Other (See Comments)    Lips/mouth swelling  . Tiotropium Swelling   Patient Measurements: Height: 5\' 3"  (160 cm) Weight: 233 lb 14.4 oz (106.096 kg) IBW/kg (Calculated) : 52.4 Vital Signs: Temp: 97.7 F (36.5 C) (12/24 0527) Temp Source: Oral (12/24 0527) BP: 92/59 mmHg (12/24 0527) Pulse Rate: 88 (12/24 0527)  Recent Labs  09/29/15 1731 09/29/15 1825 09/29/15 2049 09/30/15 0637 10/01/15 0420  HGB 12.3  --   --  11.2* 11.1*  HCT 38.3  --   --  35.0 33.8*  PLT 212  --   --  176 147*  APTT  --   --   --  38*  --   LABPROT  --  21.9*  --   --  20.8*  INR  --  1.92  --   --  1.79  CREATININE 2.15*  --   --  2.22* 2.14*  TROPONINI <0.03  --  <0.03  --   --    Estimated Creatinine Clearance: 26.9 mL/min (by C-G formula based on Cr of 2.14).  Medical History: Past Medical History  Diagnosis Date  . COPD (chronic obstructive pulmonary disease) (HCC)   . Hypertension   . Atrial fibrillation (HCC)   . CHF (congestive heart failure) (HCC)   . Chronic kidney disease     Assessment: 74 yo female with a PMH of A.Fib. Has been taking warfarin 2mg  daily at home. Pharmacy now consulted for dosing and monitoring of warfarin dose.  Patients INR on admission (12/22) was 1.92. Patient did not receive a dose on 12/22, last reported dose was 12/21.  12/22   INR 1.92, no warfarin given. 12/23  (no INR)  Warfarin 3mg  given. 12/24  INR 1.79.    Goal of Therapy:  INR 2-3 Monitor platelets by anticoagulation protocol: Yes   Plan:  Due to subtherapeutic INR, will give 1 time dose of 4 mg tonight.   PT/INR ordered with AM labs. Pharmacy will continue to follow and adjust as needed.   Stormy CardKatsoudas,Keldan Eplin K, [facility] HospitalRPH  Clinical Pharmacist 10/01/2015,7:41 AM

## 2015-10-01 NOTE — Progress Notes (Signed)
Patient had another episode of coughing and possible aspiration with lunch. MD notified. Patient made NPO. SLP paged regarding evaluation. Bo McclintockBrewer,Padme Arriaga S, RN

## 2015-10-01 NOTE — Evaluation (Signed)
Clinical/Bedside Swallow Evaluation Patient Details  Name: Maureen Wilson MRN: [id number] Date of Birth: Dec 28, 1940  Today's Date: 10/01/2015 Time: SLP Start Time (ACUTE ONLY): 1330 SLP Stop Time (ACUTE ONLY): 1300 SLP Time Calculation (min) (ACUTE ONLY): 1410 min  Past Medical History:  Past Medical History  Diagnosis Date  . COPD (chronic obstructive pulmonary disease) (HCC)   . Hypertension   . Atrial fibrillation (HCC)   . CHF (congestive heart failure) (HCC)   . Chronic kidney disease    Past Surgical History:  Past Surgical History  Procedure Laterality Date  . Partial hip arthroplasty Right   . Total hip arthroplasty Left   . Abdominal hysterectomy     HPI:  Pt admitted with fall but has had 2 choking episodes since admisssion. Family reports occasional dificulty at home over the past several months.   Assessment / Plan / Recommendation Clinical Impression  Pt presented with mild dysphagia at bedside today with no overt s/s of aspiration. Nsg and family report 2 seperate episodes of choking today. Oral mech exam revealed some teeth missing and very dry mucosa. Pt was able to masticate the solids given extra time but needed alternating thick liquids to help clear any residue.Pt tolerated the honey thick liquids and the applesauce well. Vocal quality was hoarse but remained clear. Family also reports that Pt has had severtal choking episodes at home or at restaraunts with solid foods like chicken. Given Pts history and todays bedside swallow eval, rec a dysphagia 1 diet with honey thick liquids with strict aspiration precautions. As overall status improves, MD may want to consider further esophageal assessment for possible stricture. ST to follow up with toleration of diet and upgrade as appropriate. Pt may have ice chips for pleasure. Rec strict aspiration precautions. Discontinue feeding if any s/s of aspiration    Aspiration Risk  Moderate aspiration risk    Diet  Recommendation  Dysphagia 1 with honey thick liquids   Medication Administration: Whole meds with puree    Other  Recommendations Recommended Consults: Consider esophageal assessment Oral Care Recommendations: Oral care BID Other Recommendations: Other (Comment) (ice chips ok)   Follow up Recommendations       Frequency and Duration min 3x week          Prognosis Prognosis for Safe Diet Advancement: Good      Swallow Study   General Date of Onset: 09/30/15 HPI: Pt admitted with fall but has had 2 choking episodes since admisssion. Family reports occasional dificulty at home over the past several months. Type of Study: Bedside Swallow Evaluation Diet Prior to this Study: Regular Respiratory Status: Nasal cannula Behavior/Cognition: Lethargic/Drowsy Oral Cavity Assessment: Dry Oral Cavity - Dentition: Missing dentition Self-Feeding Abilities: Able to feed self Patient Positioning: Upright in bed Baseline Vocal Quality: Hoarse Volitional Cough: Strong Volitional Swallow: Unable to elicit    Oral/Motor/Sensory Function Overall Oral Motor/Sensory Function: Within functional limits   Ice Chips Presentation: Cup;Self Fed;Spoon   Thin Liquid Thin Liquid: Within functional limits Other Comments: given small sips. Family and nsg reports episode of choking today    Nectar Thick     Honey Thick Honey Thick Liquid: Within functional limits   Puree Puree: Within functional limits   Solid Solid: Impaired Presentation: Self Fed Oral Phase Impairments: Impaired mastication Oral Phase Functional Implications: Oral residue       Eather ColasJennifer A Courtni Balash 10/01/2015,3:06 PM

## 2015-10-01 NOTE — Plan of Care (Signed)
Problem: Safety: Goal: Ability to remain free from injury will improve Outcome: Progressing Patient is High fall risk. Patient unable to ambulate without 2 person assist and dyspnea upon exertion.  Patient remained in bed this shift.  Patient bed in lowest position with call light and phone within reach and bed alarm on throughout shift.  Daughter at bedside.  Patient without injury this shift.    Problem: Health Behavior/Discharge Planning: Goal: Ability to manage health-related needs will improve Outcome: Progressing Patient lives at home with husband and daughter.  She has difficulty ambulating at baseline due to bilateral hip replacements several months ago.  She ambulates with walker but does have chronic pain.  Family worried about her care at home after discharge.   Problem: Pain Managment: Goal: General experience of comfort will improve Outcome: Progressing Patient with continued complaints of pain in right hip.  Vicodin given per eMAR.  Patient states that pain is not getting better with medication but was sleeping upon reassessment. Emotional support given. Patient refuses Q2H turning due to pain.  Problem: Skin Integrity: Goal: Risk for impaired skin integrity will decrease Outcome: Progressing Pink foam on upper right arm due to skin tear.  Dressing is clean, dry and intact.

## 2015-10-02 DIAGNOSIS — T402X1A Poisoning by other opioids, accidental (unintentional), initial encounter: Secondary | ICD-10-CM

## 2015-10-02 DIAGNOSIS — R0681 Apnea, not elsewhere classified: Secondary | ICD-10-CM

## 2015-10-02 LAB — BASIC METABOLIC PANEL
Anion gap: 12 (ref 5–15)
BUN: 49 mg/dL — ABNORMAL HIGH (ref 6–20)
CHLORIDE: 84 mmol/L — AB (ref 101–111)
CO2: 33 mmol/L — AB (ref 22–32)
Calcium: 8.8 mg/dL — ABNORMAL LOW (ref 8.9–10.3)
Creatinine, Ser: 2.34 mg/dL — ABNORMAL HIGH (ref 0.44–1.00)
GFR calc non Af Amer: 19 mL/min — ABNORMAL LOW (ref >60)
GFR, EST AFRICAN AMERICAN: 22 mL/min — AB (ref >60)
Glucose, Bld: 126 mg/dL — ABNORMAL HIGH (ref 65–99)
POTASSIUM: 3.6 mmol/L (ref 3.5–5.1)
SODIUM: 129 mmol/L — AB (ref 135–145)

## 2015-10-02 LAB — CBC
HEMATOCRIT: 32.7 % — AB (ref 35.0–47.0)
Hemoglobin: 10.9 g/dL — ABNORMAL LOW (ref 12.0–16.0)
MCH: 30.6 pg (ref 26.0–34.0)
MCHC: 33.5 g/dL (ref 32.0–36.0)
MCV: 91.3 fL (ref 80.0–100.0)
Platelets: 152 10*3/uL (ref 150–440)
RBC: 3.58 MIL/uL — ABNORMAL LOW (ref 3.80–5.20)
RDW: 15.2 % — AB (ref 11.5–14.5)
WBC: 11.1 10*3/uL — ABNORMAL HIGH (ref 3.6–11.0)

## 2015-10-02 LAB — BLOOD GAS, ARTERIAL
Acid-Base Excess: 9.6 mmol/L — ABNORMAL HIGH (ref 0.0–3.0)
Allens test (pass/fail): POSITIVE — AB
Bicarbonate: 34.3 mEq/L — ABNORMAL HIGH (ref 21.0–28.0)
DELIVERY SYSTEMS: POSITIVE
EXPIRATORY PAP: 13
FIO2: 0.32
O2 Saturation: 89.5 %
PATIENT TEMPERATURE: 37
PCO2 ART: 46 mmHg (ref 32.0–48.0)
PH ART: 7.48 — AB (ref 7.350–7.450)
PO2 ART: 53 mmHg — AB (ref 83.0–108.0)

## 2015-10-02 LAB — GLUCOSE, CAPILLARY
GLUCOSE-CAPILLARY: 167 mg/dL — AB (ref 65–99)
Glucose-Capillary: 108 mg/dL — ABNORMAL HIGH (ref 65–99)
Glucose-Capillary: 112 mg/dL — ABNORMAL HIGH (ref 65–99)
Glucose-Capillary: 100 mg/dL — ABNORMAL HIGH (ref 65–99)

## 2015-10-02 LAB — PROTIME-INR
INR: 2.73
PROTHROMBIN TIME: 28.5 s — AB (ref 11.4–15.0)

## 2015-10-02 MED ORDER — NALOXONE HCL 0.4 MG/ML IJ SOLN: 0.4000 mg | INTRAMUSCULAR | Status: DC | PRN

## 2015-10-02 MED ORDER — SODIUM CHLORIDE 0.9 % IV SOLN
INTRAVENOUS | Status: DC
  Administered 2015-10-02: 21:00:00 via INTRAVENOUS

## 2015-10-02 MED ORDER — WARFARIN SODIUM 1 MG PO TABS
2.0000 mg | ORAL_TABLET | Freq: Once | ORAL | Status: AC
  Administered 2015-10-02: 2 mg via ORAL
  Filled 2015-10-02: qty 2

## 2015-10-02 MED ORDER — NALOXONE HCL 0.4 MG/ML IJ SOLN
INTRAMUSCULAR | Status: AC
  Administered 2015-10-02: 0.4 mg
  Filled 2015-10-02: qty 1

## 2015-10-02 NOTE — Consult Note (Addendum)
PULMONARY CONSULT NOTE  Requesting MD/Service: Kalisetti/[facility] Date of consult: 12/25 Reason for consultation: Apnea episodes  HPI:  89 F admitted 12/22 after a fall. Transferred to ICU 12/25 AM for prolonged apneas after receiving hydromorphone and percocet overnight. Responded to naloxone but remains confused.   Past Medical History  Diagnosis Date  . COPD (chronic obstructive pulmonary disease) (HCC)   . Hypertension   . Atrial fibrillation (HCC)   . CHF (congestive heart failure) (HCC)   . Chronic kidney disease     Past Surgical History  Procedure Laterality Date  . Partial hip arthroplasty Right   . Total hip arthroplasty Left   . Abdominal hysterectomy      MEDICATIONS: I have reviewed all medications and confirmed regimen as documented  Social History   Social History  . Marital Status: Married    Spouse Name: N/A  . Number of Children: N/A  . Years of Education: N/A   Occupational History  . retired    Social History Main Topics  . Smoking status: Never Smoker   . Smokeless tobacco: Never Used  . Alcohol Use: No  . Drug Use: No  . Sexual Activity: Not on file   Other Topics Concern  . Not on file   Social History Narrative    Family History  Problem Relation Age of Onset  . Hypertension Son     ROS: Cannot obtain  Filed Vitals:   10/02/15 1100 10/02/15 1200 10/02/15 1300 10/02/15 1400  BP: 115/65 130/85 108/69 91/64  Pulse: 105 104 106 73  Temp: 98.5 F (36.9 C)   97.5 F (36.4 C)  TempSrc: Oral   Oral  Resp: 22 17 19 16   Height: 5\' 3"  (1.6 m)     Weight: 236 lb 8.9 oz (107.3 kg)     SpO2: 99% 94% 100% 96%     EXAM:  Gen: RASS -3 to -4 with prolonged apneas. RASS -1 with no apneas after naloxone HEENT: NCAT, oropharynx normal Neck: Supple without LAN, thyromegaly, JVD Lungs: breath sounds normal, percussion note normal Cardiovascular: IRIR, rate controlled, no murmurs noted Abdomen: Soft, nontender, normal BS Ext: symmetric  trace edema Neuro: CNs grossly intact, MAEs, DTRs symmetric Skin: Limited exam, no lesions noted  DATA:   BMP Latest Ref Rng 10/02/2015 10/01/2015 09/30/2015  Glucose 65 - 99 mg/dL 161(W126(H) 960(A134(H) 540(J112(H)  BUN 6 - 20 mg/dL 81(X49(H) 91(Y39(H) 78(G30(H)  Creatinine 0.44 - 1.00 mg/dL 9.56(O2.34(H) 1.30(Q2.14(H) 6.57(Q2.22(H)  Sodium 135 - 145 mmol/L 129(L) 129(L) 135  Potassium 3.5 - 5.1 mmol/L 3.6 3.6 3.1(L)  Chloride 101 - 111 mmol/L 84(L) 84(L) 85(L)  CO2 22 - 32 mmol/L 33(H) 34(H) 37(H)  Calcium 8.9 - 10.3 mg/dL 4.6(N8.8(L) 8.9 9.2    CBC Latest Ref Rng 10/02/2015 10/01/2015 09/30/2015  WBC 3.6 - 11.0 K/uL 11.1(H) 12.0(H) 11.2(H)  Hemoglobin 12.0 - 16.0 g/dL 10.9(L) 11.1(L) 11.2(L)  Hematocrit 35.0 - 47.0 % 32.7(L) 33.8(L) 35.0  Platelets 150 - 440 K/uL 152 147(L) 176    CXR (10/01/15):  LLL atx  IMPRESSION:   Prolonged apneas - appears due to opiates. Likely has some chronic baseline hypercarbia on the basis of OHS   PLAN:  DC all opiates PRN naloxone Monitor in ICU   Merwyn Katosavid B Lindaann Gradilla, MD [facility]

## 2015-10-02 NOTE — Progress Notes (Addendum)
[facility] - [place] at [facility]   PATIENT NAME: Maureen Wilson    MR#:  [id number]  DATE OF BIRTH:  June 16, 1941  SUBJECTIVE:  CHIEF COMPLAINT:   Chief Complaint  Patient presents with  . Chest Pain  . Fall   - Patient very sleepy this morning. Arousable and denies any pain. Did CPAP until 4 AM this morning. -Dozing off to sleep and having some apneic episodes while sleeping. -Had 2 episodes of aspiration yesterday and started on IV antibiotics and also diet changed over to honey thick liquids  REVIEW OF SYSTEMS:  Review of Systems  Constitutional: Negative for fever and chills.  HENT: Positive for hearing loss. Negative for ear discharge, ear pain and nosebleeds.   Eyes: Negative for blurred vision.  Respiratory: Positive for cough. Negative for shortness of breath and wheezing.   Cardiovascular: Positive for chest pain. Negative for palpitations and leg swelling.       Right sided chest pain from fall  Gastrointestinal: Negative for nausea, vomiting, abdominal pain, diarrhea and constipation.  Genitourinary: Negative for dysuria and urgency.  Musculoskeletal: Positive for myalgias, back pain, joint pain and falls.  Neurological: Negative for dizziness, sensory change, speech change, focal weakness, seizures, weakness and headaches.  Psychiatric/Behavioral: Negative for depression.    DRUG ALLERGIES:   Allergies  Allergen Reactions  . Amlodipine Other (See Comments)     causes swelling  . Hydralazine Other (See Comments)    Lip/mouth swelling  . Iohexol      Code: HIVES, Desc: pt developed hives after receiving iv dye for ct scan.  suggest she be premedicated for future exams, Onset Date: [id number]   . Metoprolol Other (See Comments)    Lip/mouth swelling  . Olmesartan Other (See Comments)    Oral edema  . Ramipril Swelling    Oral  . Sulfa Antibiotics Other (See Comments)    angioedema  . Telmisartan Other (See Comments)     Lips/mouth swelling  . Tiotropium Swelling    VITALS:  Blood pressure 104/74, pulse 81, temperature 98 F (36.7 C), temperature source Oral, resp. rate 18, height  (1.6 m), weight 106.096 kg (233 lb 14.4 oz), SpO2 95 %.  PHYSICAL EXAMINATION:  Physical Exam  GENERAL:  74 y.o.-year-old elderly patient lying in the bed, sleepy and having some apneic spells.  EYES: Pupils equal, round, reactive to light and accommodation. No scleral icterus. Extraocular muscles intact.  HEENT: Head atraumatic, normocephalic. Oropharynx and nasopharynx clear.  NECK:  Supple, no jugular venous distention. No thyroid enlargement, no tenderness.  LUNGS: Gurgling upper airway noise. Normal breath sounds bilaterally, no wheezing, rales,rhonchi or crepitation. Decreased bibasilar breath sounds. No use of accessory muscles of respiration.  CARDIOVASCULAR: S1, S2 normal. No rubs, or gallops. 3/6 systolic murmur ABDOMEN: Soft, nontender, nondistended. Bowel sounds present. No organomegaly or mass.  EXTREMITIES: No pedal edema, cyanosis, or clubbing. Bruising noted on her right arm Tenderness of right hip laterally, able to move with some guarding due to pain  NEUROLOGIC: Sleepy and following some commands when aroused. No focal weakness noted. Sensation is intact. Has guarded movement of her right hip due to pain PSYCHIATRIC: The patient is sleepy, but arousable and following commands. However some apneic spells are noted.Marland Kitchen  SKIN: No obvious rash, lesion, or ulcer.    LABORATORY PANEL:   CBC  Recent Labs Lab 10/02/15 0444  WBC 11.1*  HGB 10.9*  HCT 32.7*  PLT 152   ------------------------------------------------------------------------------------------------------------------  Chemistries  Recent Labs Lab 09/29/15 1731  10/02/15 0444  NA 130*  < > 129*  K 3.8  < > 3.6  CL 85*  < > 84*  CO2 32  < > 33*  GLUCOSE 134*  < > 126*  BUN 27*  < > 49*  CREATININE 2.15*  < > 2.34*  CALCIUM 9.2  <  > 8.8*  AST 39  --   --   ALT 26  --   --   ALKPHOS 108  --   --   BILITOT 1.5*  --   --   < > = values in this interval not displayed. ------------------------------------------------------------------------------------------------------------------  Cardiac Enzymes  Recent Labs Lab 09/29/15 2049  TROPONINI <0.03   ------------------------------------------------------------------------------------------------------------------  RADIOLOGY:  Dg Ribs Unilateral Right  09/30/2015  CLINICAL DATA:  Pain, fell EXAM: RIGHT RIBS - 2 VIEW COMPARISON:  09/29/2015 FINDINGS: Stable cardiomegaly. Tortuous atheromatous aorta. Some increase in coarse left infrahilar atelectasis or infiltrates. No effusion. No pneumothorax. Spondylitic changes in the lower thoracic spine. Minimally displaced fracture, anterolateral aspect right fifth rib. Old anterolateral right ninth rib fracture with callus. IMPRESSION: 1. Right fifth rib fracture without pneumothorax. Electronically Signed   By: Corlis Leak  Hassell M.D.   On: 09/30/2015 14:58   Ct Pelvis Wo Contrast  09/30/2015  CLINICAL DATA:  Status post fall on patient's right side with bruising on clinical exam. Patient is on anticoagulation therapy. EXAM: CT PELVIS WITHOUT CONTRAST TECHNIQUE: Multidetector CT imaging of the pelvis was performed following the standard protocol without intravenous contrast. COMPARISON:  Radiograph of the pelvis dated 09/27/2015 FINDINGS: There is diffuse osteopenia. There is no evidence of pelvic or hip fracture. Bilateral hip prostheses are intact and in good alignment. Osteoarthritic changes are seen of the lower lumbosacral spine with disc space narrowing, vacuum phenomenon, remodeling of the vertebral bodies. Similar in severity changes seen in the posterior elements. There are changes from right anterior wall hernia repair. Soft tissues are otherwise grossly unremarkable. IMPRESSION: No evidence of fracture of the pelvis or hips, status  post bilateral hip arthroplasty. Osteopenia. 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 Electronically Signed   By: Ted Mcalpineobrinka  Dimitrova M.D.   On: 09/30/2015 14:53   Dg Chest Port 1 View  10/01/2015  CLINICAL DATA:  Possible aspiration. EXAM: PORTABLE CHEST 1 VIEW COMPARISON:  09/29/2015 FINDINGS: Cardiac enlargement is identified. There is aortic atherosclerosis noted. No pleural effusion identified. Decreased lung volumes. Atelectasis within the left midlung and left base is identified. IMPRESSION: 1. Decreased lung volumes. 2. Left midlung and left base atelectasis. Electronically Signed   By: Signa Kellaylor  Stroud M.D.   On: 10/01/2015 11:33    EKG:   Orders placed or performed during the hospital encounter of 09/29/15  . EKG 12-Lead  . EKG 12-Lead    ASSESSMENT AND PLAN:   74 year old female with history of COPD on 3 L home oxygen, hypertension, atrial fibrillation on Coumadin, history of fall with bilateral hip replacement surgeries done a few months ago, at baseline walks with a walker presents to the hospital after a fall.  #1 fall and right hip pain- no fractures noted. Mechanical fall. -Due to persistent right hip pain, and history of osteoporosis, a CT of the pelvis and hip ordered. No fracture noted on the CT as well. -Adjust pain medications and encourage ambulation with physical therapy. -Initial evaluation recommended rehabilitation.   #2 acute respiratory distress-secondary to aspiration pneumonia and prolonged apneic spells -Chest x-ray on admission was  clear, repeat chest x-ray after his aspiration yesterday showing possible left basilar infiltrate.  -Appreciate therapy consult. Continue oxygen support. Patient on chronic 3 L oxygen. Also on CPAP at bedtime. -Patient changed over to pured diet with honey thick liquids. No further aspirations noted. -However very sleepy with apneic spells this morning. Will give a nebulizer treatment and started  back on CPAP. If no improvement then will get an ABG and see if she needs to move to stepdown for BiPAP -Encourage incentive spirometer. Aspiration precautions. -Hold her narcotic pain medications for now  #3 atrial fibrillation-chronic. -Continue atenolol. On warfarin for anticoagulation. Pharmacy to adjust the dose. -INR 2.7 today -Also on Cardizem.  #4 COPD on 3 L home oxygen-appears stable. Continue her home inhalers. -On 3 L O2 now.  #5 hypokalemia-continue with potassium replacement.  #6 DVT Prophylaxis- on warfarin  #7 Hyponatremia- hold metolazone for now. Gentle hydration.  #8 confusion-delirium versus metabolic encephalopathy. -Hold narcotic pain medications. ABG if she does not improve after CPAP. -No focal deficits, will hold off on CT head at this time.  #9 acute on chronic renal failure-baseline creatinine around 1.5-2. -Creatinine elevated now. Hold metolazone and gentle hydration today. Also watch her respiratory status. -Chest x-ray from yesterday did not show any fluid. -Patient has moderate LV dysfunction with by atrial enlargement with severe mitral regurgitation. She has chronic systolic CHF. Ejection fraction from February is 2016 is 40%    All the records are reviewed and case discussed with Care Management/Social Workerr. Management plans discussed with the patient, family and they are in agreement.  CODE STATUS: Full Code  TOTAL CRITICAL CARE TIME SPENT IN TAKING CARE OF THIS PATIENT:  40 minutes.   POSSIBLE D/C IN 2 DAYS, DEPENDING ON CLINICAL CONDITION.   Levante Simones M.D on 10/02/2015 at 9:00 AM  Between 7am to 6pm - Pager - [phone]  After 6pm go to [email or web address] - password [email or web address] [facility]  [facility]  Office  [phone]  CC: Primary care physician; Joanna Hews, MD

## 2015-10-02 NOTE — Plan of Care (Signed)
Problem: Safety: Goal: Ability to remain free from injury will improve Outcome: Progressing Patient is High fall risk. Patient unable to ambulate without 2 person assist and dyspnea upon exertion. Patient remained in bed this shift. Patient bed in lowest position with call light and phone within reach and bed alarm on throughout shift. Daughter at bedside. Patient without injury this shift. Patient with episodes of coughing during day shift.  Patient given thickened liquids this shift without incident.  Problem: Pain Managment: Goal: General experience of comfort will improve Outcome: Progressing Patient with continued complaints of pain in right hip. Vicodin given per eMAR. Emotional support given. Patient refuses Q2H turning due to pain.  Problem: Skin Integrity: Goal: Risk for impaired skin integrity will decrease Outcome: Progressing Pink foam on upper right arm due to skin tear. Dressing is clean, dry and intact.  Patient refusing Q2H turning due to pain in hips.

## 2015-10-02 NOTE — Progress Notes (Signed)
ANTICOAGULATION CONSULT NOTE - Initial Consult  Pharmacy Consult for warfarin  Indication: atrial fibrillation  Allergies  Allergen Reactions  . Amlodipine Other (See Comments)    10mg  causes swelling  . Hydralazine Other (See Comments)    Lip/mouth swelling  . Iohexol      Code: HIVES, Desc: pt developed hives after receiving iv dye for ct scan.  suggest she be premedicated for future exams, Onset Date: [id number]   . Metoprolol Other (See Comments)    Lip/mouth swelling  . Olmesartan Other (See Comments)    Oral edema  . Ramipril Swelling    Oral  . Sulfa Antibiotics Other (See Comments)    angioedema  . Telmisartan Other (See Comments)    Lips/mouth swelling  . Tiotropium Swelling   Patient Measurements: Height: 5\' 3"  (160 cm) Weight: 233 lb 14.4 oz (106.096 kg) IBW/kg (Calculated) : 52.4 Vital Signs: Temp: 98 F (36.7 C) (12/25 0417) Temp Source: Oral (12/25 0417) BP: 104/74 mmHg (12/25 0417) Pulse Rate: 81 (12/25 0417)  Recent Labs  09/29/15 1731 09/29/15 1825 09/29/15 2049 09/30/15 0637 10/01/15 0420 10/02/15 0444  HGB 12.3  --   --  11.2* 11.1* 10.9*  HCT 38.3  --   --  35.0 33.8* 32.7*  PLT 212  --   --  176 147* 152  APTT  --   --   --  38*  --   --   LABPROT  --  21.9*  --   --  20.8* 28.5*  INR  --  1.92  --   --  1.79 2.73  CREATININE 2.15*  --   --  2.22* 2.14* 2.34*  TROPONINI <0.03  --  <0.03  --   --   --    Estimated Creatinine Clearance: 24.6 mL/min (by C-G formula based on Cr of 2.34).  Medical History: Past Medical History  Diagnosis Date  . COPD (chronic obstructive pulmonary disease) (HCC)   . Hypertension   . Atrial fibrillation (HCC)   . CHF (congestive heart failure) (HCC)   . Chronic kidney disease     Assessment: 74 yo female with a PMH of A.Fib. Has been taking warfarin 2mg  daily at home. Pharmacy now consulted for dosing and monitoring of warfarin dose.  Patients INR on admission (12/22) was 1.92. Patient did not receive a  dose on 12/22, last reported dose was 12/21.  12/22  INR 1.92, no warfarin given. 12/23  (no INR)  Warfarin 3mg  given. 12/24  INR 1.79.  Warfarin 4 mg 12/25 INR 2.73  Goal of Therapy:  INR 2-3 Monitor platelets by anticoagulation protocol: Yes   Plan:  INR with significant increase from yesterday so will order a smaller dose of warfarin 2 mg today.   PT/INR ordered with AM labs. Pharmacy will continue to follow and adjust as needed.   Valentina GuChristy, Marylu Dudenhoeffer D, [facility] LLCRPH  Clinical Pharmacist 10/02/2015,7:04 AM

## 2015-10-02 NOTE — Plan of Care (Signed)
Order received to transfer to ICU. [facility] No unanswered questions. Respiratory Therapist and Orderly transferring to ICU. Belongings sent with family.

## 2015-10-03 ENCOUNTER — Inpatient Hospital Stay: Payer: Medicare Other

## 2015-10-03 DIAGNOSIS — R1310 Dysphagia, unspecified: Secondary | ICD-10-CM

## 2015-10-03 LAB — GLUCOSE, CAPILLARY
GLUCOSE-CAPILLARY: 142 mg/dL — AB (ref 65–99)
GLUCOSE-CAPILLARY: 153 mg/dL — AB (ref 65–99)
GLUCOSE-CAPILLARY: 135 mg/dL — AB (ref 65–99)
Glucose-Capillary: 87 mg/dL (ref 65–99)

## 2015-10-03 LAB — BASIC METABOLIC PANEL
Anion gap: 13 (ref 5–15)
BUN: 55 mg/dL — ABNORMAL HIGH (ref 6–20)
CALCIUM: 8.4 mg/dL — AB (ref 8.9–10.3)
CHLORIDE: 85 mmol/L — AB (ref 101–111)
CO2: 34 mmol/L — AB (ref 22–32)
CREATININE: 2.75 mg/dL — AB (ref 0.44–1.00)
GFR calc non Af Amer: 16 mL/min — ABNORMAL LOW (ref >60)
GFR, EST AFRICAN AMERICAN: 18 mL/min — AB (ref >60)
GLUCOSE: 95 mg/dL (ref 65–99)
Potassium: 3.1 mmol/L — ABNORMAL LOW (ref 3.5–5.1)
Sodium: 132 mmol/L — ABNORMAL LOW (ref 135–145)

## 2015-10-03 LAB — PROTIME-INR
INR: 4.54
PROTHROMBIN TIME: 41.8 s — AB (ref 11.4–15.0)

## 2015-10-03 MED ORDER — SODIUM CHLORIDE 0.9 % IV SOLN
INTRAVENOUS | Status: DC
  Administered 2015-10-03: 05:00:00 via INTRAVENOUS

## 2015-10-03 MED ORDER — MORPHINE SULFATE (PF) 2 MG/ML IV SOLN
2.0000 mg | INTRAVENOUS | Status: DC | PRN
  Administered 2015-10-03 – 2015-10-04 (×3): 2 mg via INTRAVENOUS
  Filled 2015-10-03 (×3): qty 1

## 2015-10-03 MED ORDER — POTASSIUM CHLORIDE 10 MEQ/100ML IV SOLN
10.0000 meq | INTRAVENOUS | Status: AC
  Administered 2015-10-03 (×4): 10 meq via INTRAVENOUS
  Filled 2015-10-03 (×4): qty 100

## 2015-10-03 MED ORDER — POTASSIUM CHLORIDE CRYS ER 20 MEQ PO TBCR: 40.0000 meq | EXTENDED_RELEASE_TABLET | Freq: Once | ORAL | Status: DC

## 2015-10-03 MED ORDER — CLONAZEPAM 0.5 MG PO TABS
0.5000 mg | ORAL_TABLET | Freq: Three times a day (TID) | ORAL | Status: DC
  Administered 2015-10-03 – 2015-10-05 (×6): 0.5 mg via ORAL
  Filled 2015-10-03 (×6): qty 1

## 2015-10-03 MED ORDER — SODIUM CHLORIDE 0.9 % IV SOLN
3.0000 g | Freq: Two times a day (BID) | INTRAVENOUS | Status: DC
  Administered 2015-10-03 – 2015-10-05 (×5): 3 g via INTRAVENOUS
  Filled 2015-10-03 (×7): qty 3

## 2015-10-03 NOTE — Progress Notes (Signed)
Pharmacy Antibiotic Consult Note  Maureen Wilson is a 74 y.o. year-old female admitted on 09/29/2015.  The patient is currently on day 3 of Zosyn 4.5gm q8hr  for Aspiration PNA.  Assessment/Plan: After discussion with Dr. Sung AmabileSimonds, the current antibiotic(s) Zosyn 4.5gm q8h will be narrowed to Unasyn and continued for X days.  The stop date is entered and will be X.   12/26: Will start patient on Unasyn 3g IV q12 hours due to CrCl <5430ml/min (CrCl= 21.1 on 12/26). Pharmacy will continue to follow labs and renal function and make adjustments as needed.   Temp (24hrs), Avg:97.8 F (36.6 C), Min:97.5 F (36.4 C), Max:98.5 F (36.9 C)   Recent Labs Lab 09/29/15 1731 09/30/15 0637 10/01/15 0420 10/02/15 0444  WBC 13.5* 11.2* 12.0* 11.1*    Recent Labs Lab 09/29/15 1731 09/30/15 0637 10/01/15 0420 10/02/15 0444 10/03/15 0227  CREATININE 2.15* 2.22* 2.14* 2.34* 2.75*   Estimated Creatinine Clearance: 21.1 mL/min (by C-G formula based on Cr of 2.75).    Allergies  Allergen Reactions  . Amlodipine Other (See Comments)    10mg  causes swelling  . Hydralazine Other (See Comments)    Lip/mouth swelling  . Iohexol      Code: HIVES, Desc: pt developed hives after receiving iv dye for ct scan.  suggest she be premedicated for future exams, Onset Date: [id number]   . Metoprolol Other (See Comments)    Lip/mouth swelling  . Olmesartan Other (See Comments)    Oral edema  . Ramipril Swelling    Oral  . Sulfa Antibiotics Other (See Comments)    angioedema  . Telmisartan Other (See Comments)    Lips/mouth swelling  . Tiotropium Swelling    Antimicrobials this admission: 12/24 >> Zosyn  12/26  >> Unasyn   Microbiology results:  12/23  MRSA PCR: Positive   Thank you for allowing pharmacy to be a part of this patient's care.  Cher NakaiSheema Dwayne Begay, PharmD Pharmacy Resident 10/03/2015 10:57 AM

## 2015-10-03 NOTE — Progress Notes (Signed)
Spoke with Dr. Anne HahnWillis about Pt's critical INR 4.54. Pharmacy has already been consulted to manage Coumadin. Orders to hold today's dose and continue to monitor INR. Pt has not voided this shift. Bladder scan showed about 120ml of urine in the bladder. Dr. Anne HahnWillis informed. After looking at Pt's labs, orders to increase maintainance fluid to 13000ml/hr given. Will continue to monitor.

## 2015-10-03 NOTE — Progress Notes (Signed)
Initial Nutrition Assessment     INTERVENTION:  Meals and snacks: Monitor intake within diet restrictions Medical Nutrition Supplement Therapy: continue magic cup BID for added nutrition and will add honey thick mightyshake q daily   NUTRITION DIAGNOSIS:   Inadequate oral intake related to altered GI function as evidenced by per patient/family report.    GOAL:   Patient will meet greater than or equal to 90% of their needs    MONITOR:    (Energy intake, Digestive system)  REASON FOR ASSESSMENT:    (diet order)    ASSESSMENT:      Pt admitted with fall, transferred to ICU for prolonged apenia, possible aspiration pneumonia  Past Medical History  Diagnosis Date  . COPD (chronic obstructive pulmonary disease) (HCC)   . Hypertension   . Atrial fibrillation (HCC)   . CHF (congestive heart failure) (HCC)   . Chronic kidney disease     Current Nutrition: drank few sips of honey thick liquids this am, full tray at bedside.  Pt reports no appetite   Food/Nutrition-Related History: Pt reports poor po intake for few days before coming to hospital   Scheduled Medications:  . albuterol  3 mL Inhalation QID  . ampicillin-sulbactam (UNASYN) IV  3 g Intravenous Q12H  . aspirin EC  81 mg Oral Daily  . atenolol  25 mg Oral BID  . atorvastatin  20 mg Oral QHS  . cholecalciferol  2,000 Units Oral Daily  . diltiazem  180 mg Oral Daily  . escitalopram  10 mg Oral Daily  . fluticasone  2 spray Each Nare Daily  . furosemide  40 mg Oral Daily  . insulin aspart  0-20 Units Subcutaneous TID WC  . insulin aspart  0-5 Units Subcutaneous QHS  . levothyroxine  50 mcg Oral QAC breakfast  . pantoprazole  40 mg Oral QAC breakfast  . potassium chloride SA  20 mEq Oral Daily  . predniSONE  5 mg Oral Q breakfast  . sodium chloride  3 mL Intravenous Q12H  . tamsulosin  0.4 mg Oral Daily  . Warfarin - Pharmacist Dosing Inpatient   Does not apply q1800    Continuous Medications:  .  sodium chloride 100 mL/hr at 10/03/15 0431     Electrolyte/Renal Profile and Glucose Profile:   Recent Labs Lab 10/01/15 0420 10/02/15 0444 10/03/15 0227  NA 129* 129* 132*  K 3.6 3.6 3.1*  CL 84* 84* 85*  CO2 34* 33* 34*  BUN 39* 49* 55*  CREATININE 2.14* 2.34* 2.75*  CALCIUM 8.9 8.8* 8.4*  GLUCOSE 134* 126* 95   Protein Profile:  Recent Labs Lab 09/29/15 1731  ALBUMIN 4.5    Gastrointestinal Profile: Last BM: 12/22   Nutrition-Focused Physical Exam Findings:  Unable to complete Nutrition-Focused physical exam at this time.     Weight Change: stable wt per pt    Diet Order:  DIET - DYS 1 Room service appropriate?: Yes; Fluid consistency:: Honey Thick Diet NPO time specified  Skin:   reviewed   Height:   Ht Readings from Last 1 Encounters:  10/02/15 5\' 3"  (1.6 m)    Weight:   Wt Readings from Last 1 Encounters:  10/02/15 236 lb 8.9 oz (107.3 kg)    Ideal Body Weight:     BMI:  Body mass index is 41.91 kg/(m^2).  Estimated Nutritional Needs:   Kcal:  BEE 989 kcals (IF 1.0-1.3, AF 1.2) [id number] kcals/d. Using IBW of 52kg  Protein:  (1.0-1.3 g/kg) 52-68  g/d  Fluid:  (25-72ml/kg) 1300-1579ml/d  EDUCATION NEEDS:   No education needs identified at this time  MODERATE Care Level  Mc Hollen B. Freida Busman, RD, LDN [phone] (pager) Weekend/On-Call pager [phone])

## 2015-10-03 NOTE — Progress Notes (Signed)
More awake, alert No distress Complains of "choking" No further apnea episodes  Filed Vitals:   10/03/15 0759 10/03/15 0800 10/03/15 0900 10/03/15 1000  BP:  112/90 111/75 133/91  Pulse:    104  Temp:      TempSrc:      Resp:  21 19 24   Height:      Weight:      SpO2: 97%   97%   Gen: RASS 0, + F/C HEENT: NCAT, oropharynx normal Neck: Supple without LAN, thyromegaly, JVD Lungs: breath sounds normal, percussion note normal Cardiovascular: IRIR, rate controlled, no murmurs noted Abdomen: Soft, nontender, normal BS Ext: symmetric trace edema Neuro: CNs grossly intact, MAEs, DTRs symmetric  BMP Latest Ref Rng 10/03/2015 10/02/2015 10/01/2015  Glucose 65 - 99 mg/dL 95 161(W126(H) 960(A134(H)  BUN 6 - 20 mg/dL 54(U55(H) 98(J49(H) 19(J39(H)  Creatinine 0.44 - 1.00 mg/dL 4.78(G2.75(H) 9.56(O2.34(H) 1.30(Q2.14(H)  Sodium 135 - 145 mmol/L 132(L) 129(L) 129(L)  Potassium 3.5 - 5.1 mmol/L 3.1(L) 3.6 3.6  Chloride 101 - 111 mmol/L 85(L) 84(L) 84(L)  CO2 22 - 32 mmol/L 34(H) 33(H) 34(H)  Calcium 8.9 - 10.3 mg/dL 6.5(H8.4(L) 8.4(O8.8(L) 8.9    CBC Latest Ref Rng 10/02/2015 10/01/2015 09/30/2015  WBC 3.6 - 11.0 K/uL 11.1(H) 12.0(H) 11.2(H)  Hemoglobin 12.0 - 16.0 g/dL 10.9(L) 11.1(L) 11.2(L)  Hematocrit 35.0 - 47.0 % 32.7(L) 33.8(L) 35.0  Platelets 150 - 440 K/uL 152 147(L) 176    CXR: NSC   IMPRESSION: Prolonged apneas due to opiates - resolved OSA - nocturnal CPAP @ home 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 Please call if we can be of further assistance  Billy Fischeravid Amon Costilla, MD PCCM service Mobile [phone] Pager [phone]

## 2015-10-03 NOTE — Progress Notes (Signed)
[facility] - [place] at [facility]   PATIENT NAME: Aleynah Rocchio    MR#:  [id number]  DATE OF BIRTH:  01/24/1941  SUBJECTIVE:  CHIEF COMPLAINT:   Chief Complaint  Patient presents with  . Chest Pain  . Fall   - More alert, occasional coughing with meds. Breathing better - on bipap last night -overall improving  REVIEW OF SYSTEMS:  Review of Systems  Constitutional: Negative for fever and chills.  HENT: Positive for hearing loss. Negative for ear discharge, ear pain and nosebleeds.   Eyes: Negative for blurred vision.  Respiratory: Positive for cough. Negative for shortness of breath and wheezing.   Cardiovascular: Positive for chest pain. Negative for palpitations and leg swelling.       Right sided chest pain from fall  Gastrointestinal: Negative for nausea, vomiting, abdominal pain, diarrhea and constipation.  Genitourinary: Negative for dysuria and urgency.  Musculoskeletal: Positive for myalgias, joint pain and falls. Negative for back pain.  Neurological: Positive for weakness. Negative for dizziness, sensory change, speech change, focal weakness, seizures and headaches.  Psychiatric/Behavioral: Negative for depression.    DRUG ALLERGIES:   Allergies  Allergen Reactions  . Amlodipine Other (See Comments)     causes swelling  . Hydralazine Other (See Comments)    Lip/mouth swelling  . Iohexol      Code: HIVES, Desc: pt developed hives after receiving iv dye for ct scan.  suggest she be premedicated for future exams, Onset Date: [id number]   . Metoprolol Other (See Comments)    Lip/mouth swelling  . Olmesartan Other (See Comments)    Oral edema  . Ramipril Swelling    Oral  . Sulfa Antibiotics Other (See Comments)    angioedema  . Telmisartan Other (See Comments)    Lips/mouth swelling  . Tiotropium Swelling    VITALS:  Blood pressure 133/91, pulse 104, temperature 97.8 F (36.6 C), temperature source Axillary, resp. rate  24, height  (1.6 m), weight 107.3 kg (236 lb 8.9 oz), SpO2 97 %.  PHYSICAL EXAMINATION:  Physical Exam  GENERAL:  74 y.o.-year-old elderly patient lying in the bed, alert and not in acute distress today.  EYES: Pupils equal, round, reactive to light and accommodation. No scleral icterus. Extraocular muscles intact.  HEENT: Head atraumatic, normocephalic. Oropharynx and nasopharynx clear.  NECK:  Supple, no jugular venous distention. No thyroid enlargement, no tenderness.  LUNGS: Normal breath sounds bilaterally, no wheezing, rales,rhonchi or crepitation. Decreased bibasilar breath sounds. No use of accessory muscles of respiration.  CARDIOVASCULAR: S1, S2 normal. No rubs, or gallops. 3/6 systolic murmur ABDOMEN: Soft, nontender, nondistended. Bowel sounds present. No organomegaly or mass.  EXTREMITIES: No pedal edema, cyanosis, or clubbing. Bruising noted on her right arm Tenderness of right hip laterally, able to move with some guarding due to pain  NEUROLOGIC: Cranial nerves intact. No focal motor or sensory deficits. Right leg movement has much improved now. Gait not checked. PSYCHIATRIC: Recent is alert and oriented 3. No further apneic spells  SKIN: No obvious rash, lesion, or ulcer.    LABORATORY PANEL:   CBC  Recent Labs Lab 10/02/15 0444  WBC 11.1*  HGB 10.9*  HCT 32.7*  PLT 152   ------------------------------------------------------------------------------------------------------------------  Chemistries   Recent Labs Lab 09/29/15 1731  10/03/15 0227  NA 130*  < > 132*  K 3.8  < > 3.1*  CL 85*  < > 85*  CO2 32  < > 34*  GLUCOSE 134*  < >  95  BUN 27*  < > 55*  CREATININE 2.15*  < > 2.75*  CALCIUM 9.2  < > 8.4*  AST 39  --   --   ALT 26  --   --   ALKPHOS 108  --   --   BILITOT 1.5*  --   --   < > = values in this interval not  displayed. ------------------------------------------------------------------------------------------------------------------  Cardiac Enzymes  Recent Labs Lab 09/29/15 2049  TROPONINI <0.03   ------------------------------------------------------------------------------------------------------------------  RADIOLOGY:  Dg Chest Port 1 View  10/03/2015  CLINICAL DATA:  Shortness breath and cough since admission after falling 4 days ago. EXAM: PORTABLE CHEST 1 VIEW COMPARISON:  10/01/2015 and 09/30/2015. FINDINGS: 1040 hours. There are progressively lower lung volumes with similar basilar and perihilar atelectasis bilaterally. No definite edema or significant pleural effusion. The heart size and mediastinal contours are stable. The heart is mildly enlarged. IMPRESSION: Cardiomegaly with lower lung volumes and increased atelectasis bilaterally. No definite edema. Electronically Signed   By: Carey Bullocks M.D.   On: 10/03/2015 11:00    EKG:   Orders placed or performed during the hospital encounter of 09/29/15  . EKG 12-Lead  . EKG 12-Lead    ASSESSMENT AND PLAN:   74 year old female with history of COPD on 3 L home oxygen, hypertension, atrial fibrillation on Coumadin, history of fall with bilateral hip replacement surgeries done a few months ago, at baseline walks with a walker presents to the hospital after a fall.  #1 fall and right hip pain- no fractures noted. Mechanical fall. -Due to persistent right hip pain, and history of osteoporosis, a CT of the pelvis and hip ordered. No fracture noted on the CT as well. -Adjust pain medications and encourage ambulation with physical therapy. Heating pad advised. -Initial evaluation recommended rehabilitation.   #2 acute respiratory distress-secondary to aspiration pneumonia and prolonged apneic spells -Apneic spells secondary to narcotic usage. Chest x-ray with left basilar atelectasis and cardiomegaly. -Used BiPAP last night. All  narcotics held. Patient's mental status is back to normal. -Speech therapist put her on honey thick liquids. Family history of significant aspiration and patient still has some concern about esophageal stricture. Barium swallow tomorrow morning -Incentive spirometry. Appreciate pulmonary consult. -Continue CPAP at bedtime  #3 atrial fibrillation-chronic. -Continue atenolol. On warfarin for anticoagulation. Pharmacy to adjust the dose. -Also on Cardizem.  #4 COPD on 3 L home oxygen-appears stable. Continue her home inhalers. -On 3 L O2 now. - Encourage incentive spirometry  #5 hypokalemia-continue with potassium replacement.  #6 DVT Prophylaxis- on warfarin supratherapeutic INR, pharmacy adjusting dose. Hold today  #7 Hyponatremia- hold metolazone for now. Improvement noted. Hypokalemia-being replaced. Also Lasix being held today.  #8 confusion- likely acute delirium -Hold narcotic pain medications. Cont CPAP at bedtime -No focal deficits  #9 acute on chronic renal failure-baseline creatinine around 1.5-2. -Creatinine elevated now. Hold metolazone and lasix - received gentle hydration.  -Chest x-ray with cardiomegaly -Patient has moderate LV dysfunction with by atrial enlargement with severe mitral regurgitation. She has chronic systolic CHF. Ejection fraction from February is 2016 is 40%   If stable, we'll transfer to floor today.   All the records are reviewed and case discussed with Care Management/Social Workerr. Management plans discussed with the patient, family and they are in agreement.  CODE STATUS: Full Code  TOTAL CRITICAL CARE TIME SPENT IN TAKING CARE OF THIS PATIENT:  38 minutes.   POSSIBLE D/C IN 2 DAYS, DEPENDING ON CLINICAL CONDITION.  Enid BaasKALISETTI,Jolea Dolle M.D on 10/03/2015 at 2:06 PM  Between 7am to 6pm - Pager - [phone]  After 6pm go to [email or web address] - password [email or web address] [facility]  [facility] [place]  Office  [phone]  CC: Primary care  physician; Joanna HewsATE,ALLEN D, MD

## 2015-10-03 NOTE — Progress Notes (Signed)
ANTICOAGULATION CONSULT NOTE - Follow Up  Pharmacy Consult for warfarin  Indication: atrial fibrillation  Allergies  Allergen Reactions  . Amlodipine Other (See Comments)    10mg  causes swelling  . Hydralazine Other (See Comments)    Lip/mouth swelling  . Iohexol      Code: HIVES, Desc: pt developed hives after receiving iv dye for ct scan.  suggest she be premedicated for future exams, Onset Date: [id number]   . Metoprolol Other (See Comments)    Lip/mouth swelling  . Olmesartan Other (See Comments)    Oral edema  . Ramipril Swelling    Oral  . Sulfa Antibiotics Other (See Comments)    angioedema  . Telmisartan Other (See Comments)    Lips/mouth swelling  . Tiotropium Swelling   Patient Measurements: Height: 5\' 3"  (160 cm) Weight: 236 lb 8.9 oz (107.3 kg) IBW/kg (Calculated) : 52.4 Vital Signs: Temp: 97.8 F (36.6 C) (12/26 0700) Temp Source: Axillary (12/26 0700) BP: 112/90 mmHg (12/26 0800) Pulse Rate: 98 (12/26 0700)  Recent Labs  10/01/15 0420 10/02/15 0444 10/03/15 0227  HGB 11.1* 10.9*  --   HCT 33.8* 32.7*  --   PLT 147* 152  --   LABPROT 20.8* 28.5* 41.8*  INR 1.79 2.73 4.54*  CREATININE 2.14* 2.34* 2.75*   Estimated Creatinine Clearance: 21.1 mL/min (by C-G formula based on Cr of 2.75).  Medical History: Past Medical History  Diagnosis Date  . COPD (chronic obstructive pulmonary disease) (HCC)   . Hypertension   . Atrial fibrillation (HCC)   . CHF (congestive heart failure) (HCC)   . Chronic kidney disease     Assessment: 74 yo female with a PMH of A.Fib. Has been taking warfarin 2mg  daily at home. Pharmacy now consulted for dosing and monitoring of warfarin dose.  Patients INR on admission (12/22) was 1.92. Patient did not receive a dose on 12/22, last reported dose was 12/21.  12/22  INR 1.92, no warfarin given. 12/23  (no INR)  Warfarin 3mg  given. 12/24  INR 1.79.  Warfarin 4 mg 12/25 INR 2.73 12/26 INR 4.54  Goal of Therapy:  INR  2-3 Monitor platelets by anticoagulation protocol: Yes   Plan:  INR Supratherapeutic today at 4.54. Will hold warfarin dose tonight.  PT/INR ordered with AM labs. Pharmacy will continue to follow and adjust as needed.   Cher NakaiSheema Yaeli Hartung, PharmD Pharmacy Resident 10/03/2015,9:09 AM

## 2015-10-04 ENCOUNTER — Inpatient Hospital Stay: Payer: Medicare Other

## 2015-10-04 ENCOUNTER — Encounter
Admission: RE | Admit: 2015-10-04 | Discharge: 2015-10-04 | Disposition: A | Discharge status: Home / Self Care | Payer: Medicare Other | Source: Ambulatory Visit | Attending: Internal Medicine | Admitting: Internal Medicine

## 2015-10-04 LAB — BASIC METABOLIC PANEL
ANION GAP: 10 (ref 5–15)
BUN: 49 mg/dL — ABNORMAL HIGH (ref 6–20)
CO2: 38 mmol/L — ABNORMAL HIGH (ref 22–32)
Calcium: 8.8 mg/dL — ABNORMAL LOW (ref 8.9–10.3)
Chloride: 90 mmol/L — ABNORMAL LOW (ref 101–111)
Creatinine, Ser: 1.99 mg/dL — ABNORMAL HIGH (ref 0.44–1.00)
GFR, EST AFRICAN AMERICAN: 27 mL/min — AB (ref >60)
GFR, EST NON AFRICAN AMERICAN: 24 mL/min — AB (ref >60)
GLUCOSE: 102 mg/dL — AB (ref 65–99)
POTASSIUM: 2.6 mmol/L — AB (ref 3.5–5.1)
Sodium: 138 mmol/L (ref 135–145)

## 2015-10-04 LAB — GLUCOSE, CAPILLARY
GLUCOSE-CAPILLARY: 80 mg/dL (ref 65–99)
GLUCOSE-CAPILLARY: 181 mg/dL — AB (ref 65–99)
Glucose-Capillary: 92 mg/dL (ref 65–99)
Glucose-Capillary: 126 mg/dL — ABNORMAL HIGH (ref 65–99)

## 2015-10-04 LAB — PROTIME-INR
INR: 4.96 — AB
Prothrombin Time: 44.7 seconds — ABNORMAL HIGH (ref 11.4–15.0)

## 2015-10-04 LAB — POTASSIUM: POTASSIUM: 3.1 mmol/L — AB (ref 3.5–5.1)

## 2015-10-04 MED ORDER — CHLORHEXIDINE GLUCONATE CLOTH 2 % EX PADS
6.0000 | MEDICATED_PAD | Freq: Every day | CUTANEOUS | Status: DC
  Administered 2015-10-04 – 2015-10-05 (×2): 6 via TOPICAL

## 2015-10-04 MED ORDER — MUPIROCIN 2 % EX OINT
1.0000 "application " | TOPICAL_OINTMENT | Freq: Two times a day (BID) | CUTANEOUS | Status: DC
  Administered 2015-10-04 – 2015-10-05 (×3): 1 via NASAL
  Filled 2015-10-04: qty 22

## 2015-10-04 MED ORDER — TRAMADOL HCL 50 MG PO TABS
50.0000 mg | ORAL_TABLET | Freq: Four times a day (QID) | ORAL | Status: DC | PRN
  Administered 2015-10-04: 50 mg via ORAL
  Filled 2015-10-04: qty 1

## 2015-10-04 MED ORDER — FUROSEMIDE 20 MG PO TABS: 20.0000 mg | ORAL_TABLET | Freq: Every day | ORAL | Status: DC

## 2015-10-04 MED ORDER — MOMETASONE FURO-FORMOTEROL FUM 200-5 MCG/ACT IN AERO
2.0000 | INHALATION_SPRAY | Freq: Two times a day (BID) | RESPIRATORY_TRACT | Status: DC
Start: 2015-10-04 — End: 2015-10-05
  Administered 2015-10-04 – 2015-10-05 (×3): 2 via RESPIRATORY_TRACT
  Filled 2015-10-04: qty 8.8

## 2015-10-04 MED ORDER — POTASSIUM CHLORIDE CRYS ER 20 MEQ PO TBCR
20.0000 meq | EXTENDED_RELEASE_TABLET | ORAL | Status: AC
  Administered 2015-10-04 (×2): 20 meq via ORAL
  Filled 2015-10-04 (×2): qty 1

## 2015-10-04 MED ORDER — FUROSEMIDE 40 MG PO TABS
40.0000 mg | ORAL_TABLET | Freq: Every day | ORAL | Status: DC
  Administered 2015-10-04 – 2015-10-05 (×2): 40 mg via ORAL
  Filled 2015-10-04 (×2): qty 1

## 2015-10-04 MED ORDER — POTASSIUM CHLORIDE 10 MEQ/100ML IV SOLN
10.0000 meq | INTRAVENOUS | Status: AC
  Administered 2015-10-04 (×4): 10 meq via INTRAVENOUS
  Filled 2015-10-04 (×4): qty 100

## 2015-10-04 NOTE — Progress Notes (Signed)
Dr. Tobi BastosPyreddy notified about Pt's critical Potassium of 2.6. Orders to replace with IV potassium given. Will continue to monitor

## 2015-10-04 NOTE — Progress Notes (Signed)
Physical Therapy Treatment Patient Details Name: Maureen Wilson MRN: [id number] DOB: [birth year] Today's Date: 10/04/2015    History of Present Illness Patient is a 74 y/o female that presents s/p unwitnessed fall at home. Patient is on 3L of O2 at baseline.     PT Comments    Pt fatigued from today's event; pt went for procedure/test earlier today. Pt agreeable to bed exercises only. Pt participates well with exercises with no increase in R hip pain, which pt notes is a 9/10. Pt does not demonstrate distress/pain with exercises, but does have less motion/strength on R. Continue PT for progression of strength, endurance and out of bed activities to allow for improved functional mobility.   Follow Up Recommendations  SNF     Equipment Recommendations  Wheelchair (measurements PT)    Recommendations for Other Services       Precautions / Restrictions Restrictions Weight Bearing Restrictions: No    Mobility  Bed Mobility               General bed mobility comments: Not tested; pt fatigued from days events and wishes only bed exercises  Transfers                    Ambulation/Gait                 Stairs            Wheelchair Mobility    Modified Rankin (Stroke Patients Only)       Balance                                    Cognition Arousal/Alertness: Awake/alert Behavior During Therapy: WFL for tasks assessed/performed Overall Cognitive Status: Within Functional Limits for tasks assessed                      Exercises General Exercises - Lower Extremity Ankle Circles/Pumps: AROM;Both;20 reps;Supine Quad Sets: Strengthening;Both;20 reps;Supine Gluteal Sets: Strengthening;Both;20 reps;Supine Short Arc Quad: AROM;Both;20 reps;Supine Heel Slides: AAROM;Right;20 reps;Supine (AROM on L) Hip ABduction/ADduction: AROM;Both;20 reps;Supine Straight Leg Raises: AROM;Both;20 reps;Supine    General Comments         Pertinent Vitals/Pain Pain Assessment: 0-10 Pain Score: 9  Pain Location: R hip (also notes mild chest pain) Pain Intervention(s): Premedicated before session    Home Living                      Prior Function            PT Goals (current goals can now be found in the care plan section) Progress towards PT goals: Progressing toward goals (slowly)    Frequency  Min 2X/week    PT Plan Current plan remains appropriate    Co-evaluation             End of Session Equipment Utilized During Treatment: Oxygen Activity Tolerance: Patient tolerated treatment well Patient left: in bed;with call bell/phone within reach;with bed alarm set     Time: [id number] PT Time Calculation (min) (ACUTE ONLY): 23 min  Charges:  $Therapeutic Exercise: 23-37 mins                    G Codes:      Kristeen MissHeidi Elizabeth Bishop 10/04/2015, 3:16 PM

## 2015-10-04 NOTE — Clinical Social Work Placement (Signed)
   CLINICAL SOCIAL WORK PLACEMENT  NOTE  Date:  10/04/2015  Patient Details  Name: Maureen Wilson MRN: [id number] Date of Birth: 10/11/1940  Clinical Social Work is seeking post-discharge placement for this patient at the Skilled  Nursing Facility level of care (*CSW will initial, date and re-position this form in  chart as items are completed):  Yes   Patient/family provided with [place] Clinical Social Work Department's list of facilities offering this level of care within the geographic area requested by the patient (or if unable, by the patient's family).  Yes   Patient/family informed of their freedom to choose among providers that offer the needed level of care, that participate in Medicare, Medicaid or managed care program needed by the patient, have an available bed and are willing to accept the patient.  Yes   Patient/family informed of 's ownership interest in [facility] and [facility], as well as of the fact that they are under no obligation to receive care at these facilities.  PASRR submitted to EDS on       PASRR number received on       Existing PASRR number confirmed on 10/04/15     FL2 transmitted to all facilities in geographic area requested by pt/family on 10/04/15     FL2 transmitted to all facilities within larger geographic area on       Patient informed that his/her managed care company has contracts with or will negotiate with certain facilities, including the following:        Yes   Patient/family informed of bed offers received.  Patient chooses bed at  [facility] )     Physician recommends and patient chooses bed at      Patient to be transferred to   on  .  Patient to be transferred to facility by       Patient family notified on   of transfer.  Name of family member notified:        PHYSICIAN       Additional Comment:    _______________________________________________ Idamae Lusherhristina E Dane Kopke,  LCSW 10/04/2015, 2:28 PM

## 2015-10-04 NOTE — Progress Notes (Addendum)
[facility] - [place] at [facility]   PATIENT NAME: Maureen Wilson    MR#:  [id number]  DATE OF BIRTH:  12/04/1940  SUBJECTIVE:  CHIEF COMPLAINT:   Chief Complaint  Patient presents with  . Chest Pain  . Fall   - Patient doing well, still has right hip pain. Narcotics held due to apneic spells - will start tramadol as need to work with PT - Need to use CPAP at bedtime - barium swallow study today for any stricture possibility  REVIEW OF SYSTEMS:  Review of Systems  Constitutional: Negative for fever and chills.  HENT: Positive for hearing loss. Negative for ear discharge, ear pain and nosebleeds.   Eyes: Negative for blurred vision.  Respiratory: Positive for cough. Negative for shortness of breath and wheezing.   Cardiovascular: Negative for chest pain, palpitations and leg swelling.       Right sided chest pain from fall  Gastrointestinal: Negative for nausea, vomiting, abdominal pain, diarrhea and constipation.       Dysphagia  Genitourinary: Negative for dysuria and urgency.  Musculoskeletal: Positive for myalgias, joint pain and falls. Negative for back pain.  Neurological: Positive for weakness. Negative for dizziness, sensory change, speech change, focal weakness, seizures and headaches.  Psychiatric/Behavioral: Negative for depression.    DRUG ALLERGIES:   Allergies  Allergen Reactions  . Amlodipine Other (See Comments)     causes swelling  . Hydralazine Other (See Comments)    Lip/mouth swelling  . Iohexol      Code: HIVES, Desc: pt developed hives after receiving iv dye for ct scan.  suggest she be premedicated for future exams, Onset Date: [id number]   . Metoprolol Other (See Comments)    Lip/mouth swelling  . Olmesartan Other (See Comments)    Oral edema  . Ramipril Swelling    Oral  . Sulfa Antibiotics Other (See Comments)    angioedema  . Telmisartan Other (See Comments)    Lips/mouth swelling  . Tiotropium Swelling     VITALS:  Blood pressure 126/64, pulse 90, temperature 97.8 F (36.6 C), temperature source Oral, resp. rate 18, height  (1.6 m), weight 107.3 kg (236 lb 8.9 oz), SpO2 98 %.  PHYSICAL EXAMINATION:  Physical Exam  GENERAL:  74 y.o.-year-old elderly patient lying in the bed, alert and not in acute distress today. Appears well actually. EYES: Pupils equal, round, reactive to light and accommodation. No scleral icterus. Extraocular muscles intact.  HEENT: Head atraumatic, normocephalic. Oropharynx and nasopharynx clear.  NECK:  Supple, no jugular venous distention. No thyroid enlargement, no tenderness.  LUNGS: Normal breath sounds bilaterally,occasional scattered wheezing, No rales,rhonchi or crepitation. Decreased bibasilar breath sounds. No use of accessory muscles of respiration.  CARDIOVASCULAR: S1, S2 normal. No rubs, or gallops. 3/6 systolic murmur ABDOMEN: Soft, nontender, nondistended. Bowel sounds present. No organomegaly or mass.  EXTREMITIES: No pedal edema, cyanosis, or clubbing. Bruising noted on her right arm Tenderness of right hip laterally, able to move with some guarding due to pain  NEUROLOGIC: Cranial nerves intact. No focal motor or sensory deficits. Right leg movement has much improved now. Gait not checked. PSYCHIATRIC: Recent is alert and oriented 3. No further apneic spells  SKIN: No obvious rash, lesion, or ulcer.    LABORATORY PANEL:   CBC  Recent Labs Lab 10/02/15 0444  WBC 11.1*  HGB 10.9*  HCT 32.7*  PLT 152   ------------------------------------------------------------------------------------------------------------------  Chemistries   Recent Labs Lab 09/29/15 1731  10/04/15 0359  NA 130*  < > 138  K 3.8  < > 2.6*  CL 85*  < > 90*  CO2 32  < > 38*  GLUCOSE 134*  < > 102*  BUN 27*  < > 49*  CREATININE 2.15*  < > 1.99*  CALCIUM 9.2  < > 8.8*  AST 39  --   --   ALT 26  --   --   ALKPHOS 108  --   --   BILITOT 1.5*  --   --   <  > = values in this interval not displayed. ------------------------------------------------------------------------------------------------------------------  Cardiac Enzymes  Recent Labs Lab 09/29/15 2049  TROPONINI <0.03   ------------------------------------------------------------------------------------------------------------------  RADIOLOGY:  Dg Chest Port 1 View  10/03/2015  CLINICAL DATA:  Shortness breath and cough since admission after falling 4 days ago. EXAM: PORTABLE CHEST 1 VIEW COMPARISON:  10/01/2015 and 09/30/2015. FINDINGS: 1040 hours. There are progressively lower lung volumes with similar basilar and perihilar atelectasis bilaterally. No definite edema or significant pleural effusion. The heart size and mediastinal contours are stable. The heart is mildly enlarged. IMPRESSION: Cardiomegaly with lower lung volumes and increased atelectasis bilaterally. No definite edema. Electronically Signed   By: Carey BullocksWilliam  Veazey M.D.   On: 10/03/2015 11:00    EKG:   Orders placed or performed during the hospital encounter of 09/29/15  . EKG 12-Lead  . EKG 12-Lead    ASSESSMENT AND PLAN:   74 year old female with history of COPD on 3 L home oxygen, hypertension, atrial fibrillation on Coumadin, history of fall with bilateral hip replacement surgeries done a few months ago, at baseline walks with a walker presents to the hospital after a fall.  #1 fall and right hip pain- no fractures noted. Mechanical fall. -Due to persistent right hip pain, and history of osteoporosis, a CT of the pelvis and hip ordered. No fracture noted on the CT as well. -Adjust pain medications and encourage ambulation with physical therapy. Heating pad advised. -Initial evaluation recommended rehabilitation.   #2 acute respiratory distress-secondary to aspiration pneumonia and prolonged apneic spells -Apneic spells secondary to narcotic usage. Chest x-ray with left basilar atelectasis and  cardiomegaly. -All narcotics held. Patient's mental status is back to normal. - On unasyn for aspiration pneumonia -Speech therapist put her on honey thick liquids. Family history of significant aspiration and patient still has some concern about esophageal stricture.  -Barium swallow ordered for today -Incentive spirometry. Appreciate pulmonary consult. -Continue CPAP at bedtime  #3 atrial fibrillation-chronic. -Continue atenolol. On warfarin for anticoagulation. Pharmacy to adjust the dose. -Also on Cardizem.  #4 COPD on 3 L home oxygen-appears stable. Continue her home inhalers. -On 3 L O2 now. - Encourage incentive spirometry Some wheezing noted on exam today- nebs and also restart lasix  #5 hypokalemia-continue with potassium replacement. Recheck later today  #6 DVT Prophylaxis- on warfarin supratherapeutic INR, pharmacy adjusting dose. Hold today  #7 Hyponatremia- hold metolazone for now. Improvement noted. Hypokalemia-being replaced. Recheck later today Also on lasix  #8 confusion- likely acute delirium. Resolved. Also was from pain meds -Hold narcotic pain medications. Cont CPAP at bedtime -No focal deficits  #9 acute on chronic renal failure-baseline creatinine around 1.5-2. -Creatinine improved, restart lasix today. Hold metolazone -Chest x-ray with cardiomegaly -Patient has moderate LV dysfunction with by atrial enlargement with severe mitral regurgitation. She has chronic systolic CHF. Ejection fraction from February is 2016 is 40%   If stable, consider discharge to rehab tomorrow   All the records are  reviewed and case discussed with Care Management/Social Workerr. Management plans discussed with the patient, family and they are in agreement.  CODE STATUS: Full Code  TOTAL TIME SPENT IN TAKING CARE OF THIS PATIENT:  39 minutes.   POSSIBLE D/C IN 1-2 DAYS, DEPENDING ON CLINICAL CONDITION.   Enid Baas M.D on 10/04/2015 at 11:14 AM  Between 7am to  6pm - Pager - [phone]  After 6pm go to [email or web address] - password [email or web address] [facility]  [place] [facility]  Office  [phone]  CC: Primary care physician; Joanna Hews, MD

## 2015-10-04 NOTE — Progress Notes (Signed)
PT Cancellation Note  Patient Details Name: Maureen Wilson MRN: [id number] DOB: 1940/12/27   Cancelled Treatment:    Reason Eval/Treat Not Completed: Patient at procedure or test/unavailable. Treatment attempted; pt not available/out of room for barium swallow testing. Will re attempt treatment later today as the schedule allows.    [name] 10/04/2015, 12:00 PM

## 2015-10-04 NOTE — Progress Notes (Signed)
MEDICATION RELATED CONSULT NOTE - INITIAL   Pharmacy Consult for electrolyte management Indication: hypokalemia  Labs:  Recent Labs  10/02/15 0444 10/03/15 0227 10/04/15 0359  WBC 11.1*  --   --   HGB 10.9*  --   --   HCT 32.7*  --   --   PLT 152  --   --   CREATININE 2.34* 2.75* 1.99*   Assessment: Pharmacy consulted to manage and replace electrolytes in this 74 year old female with hypokalemia. Patient had a K of 2.6 this morning and received 4 runs of KCl 10 mEq IV along with 20 mEq PO K-Dur.   Potassium level this evening had improved but remains low at 3.1.  Goal of Therapy:  Electrolytes within normal limits  Plan:  Give K-Dur 20 mEq PO x 2 doses (given 2 hours apart) per protocol Recheck K with AM labs tomorrow Order Mg with AM labs  Pharmacy will continue to monitor, thank you for the consult.  Jodelle RedMary M Vola Beneke 10/04/2015,6:54 PM

## 2015-10-04 NOTE — NC FL2 (Signed)
[place] MEDICAID FL2 LEVEL OF CARE SCREENING TOOL     IDENTIFICATION  Patient Name: Berle Mullatricia King Bobe Birthdate: 1941-08-10 Sex: female Admission Date (Current Location): 09/29/2015  [place]   Facility and Address:  [facility], [address], [place], [place] [address]      Provider Number: [id number]  Attending Physician Name and Address:  Enid Baasadhika Kalisetti, MD  Relative Name and Phone Number:       Current Level of Care: Hospital Recommended Level of Care: Skilled Nursing Facility Prior Approval Number:    Date Approved/Denied:   PASRR Number:  [id number])  Discharge Plan: SNF    Current Diagnoses: Patient Active Problem List   Diagnosis Date Noted  . Aspiration pneumonia (HCC) 10/01/2015  . Fall 09/29/2015  . Gait instability 09/29/2015    Orientation RESPIRATION BLADDER Height & Weight    Self, Situation, Place  O2 (Nasal Cannula 2 (L/min); Bi-Pap; CPAP ) Incontinent 5\' 3"  (160 cm) 236 lbs.  BEHAVIORAL SYMPTOMS/MOOD NEUROLOGICAL BOWEL NUTRITION STATUS   (None)  (None) Continent Diet (NPO)  AMBULATORY STATUS COMMUNICATION OF NEEDS Skin   Extensive Assist Verbally Other (Comment) (Left Elbow skin tear; )                       Personal Care Assistance Level of Assistance  Bathing, Feeding, Dressing Bathing Assistance: Limited assistance Feeding assistance: Independent Dressing Assistance: Limited assistance     Functional Limitations Info  Sight, Hearing, Speech Sight Info: Adequate Hearing Info: Adequate Speech Info: Adequate    SPECIAL CARE FACTORS FREQUENCY  PT (By licensed PT), OT (By licensed OT)     PT Frequency:  (5) OT Frequency:  (5)            Contractures      Additional Factors Info  Code Status, Allergies, Isolation Precautions Code Status Info:  (Full Code) Allergies Info:  (Amlodipine, Hydralazine, Iohexol, Metoprolol, Olmesartan, Sulfa Antibiotics,  Telmisartan, and Tiotropium)     Isolation Precautions Info:  (Isolation- Contact Precautions: MRSA)     Current Medications (10/04/2015):  This is the current hospital active medication list Current Facility-Administered Medications  Medication Dose Route Frequency Provider Last Rate Last Dose  . albuterol (PROVENTIL) (2.5 MG/3ML) 0.083% nebulizer solution 3 mL  3 mL Inhalation QID Enid Baasadhika Kalisetti, MD   3 mL at 10/04/15 0817  . ALPRAZolam Prudy Feeler(XANAX) tablet 0.25 mg  0.25 mg Oral TID PRN Ihor AustinPavan Pyreddy, MD   0.25 mg at 10/01/15 2059  . Ampicillin-Sulbactam (UNASYN) 3 g in sodium chloride 0.9 % 100 mL IVPB  3 g Intravenous Q12H Merwyn Katosavid B Simonds, MD   3 g at 10/04/15 0158  . aspirin EC tablet 81 mg  81 mg Oral Daily Ihor AustinPavan Pyreddy, MD   81 mg at 10/03/15 0950  . atenolol (TENORMIN) tablet 25 mg  25 mg Oral BID Ihor AustinPavan Pyreddy, MD   25 mg at 10/03/15 2128  . atorvastatin (LIPITOR) tablet 20 mg  20 mg Oral QHS Ihor AustinPavan Pyreddy, MD   20 mg at 10/03/15 2128  . benzonatate (TESSALON) capsule 100 mg  100 mg Oral BID PRN Ihor AustinPavan Pyreddy, MD      . cholecalciferol (VITAMIN D) tablet 2,000 Units  2,000 Units Oral Daily Ihor AustinPavan Pyreddy, MD   2,000 Units at 10/03/15 0950  . clonazePAM (KLONOPIN) tablet 0.5 mg  0.5 mg Oral TID Enid Baasadhika Kalisetti, MD   0.5 mg at 10/03/15 2128  . diltiazem (CARDIZEM CD)  24 hr capsule 180 mg  180 mg Oral Daily Ihor Austin, MD   180 mg at 10/03/15 0951  . escitalopram (LEXAPRO) tablet 10 mg  10 mg Oral Daily Ihor Austin, MD   10 mg at 10/03/15 0950  . fluticasone (FLONASE) 50 MCG/ACT nasal spray 2 spray  2 spray Each Nare Daily Ihor Austin, MD   2 spray at 10/01/15 9604  . insulin aspart (novoLOG) injection 0-20 Units  0-20 Units Subcutaneous TID WC Ihor Austin, MD   4 Units at 10/03/15 1353  . insulin aspart (novoLOG) injection 0-5 Units  0-5 Units Subcutaneous QHS Ihor Austin, MD   0 Units at 09/30/15 2220  . ipratropium-albuterol (DUONEB) 0.5-2.5 (3) MG/3ML nebulizer solution 3  mL  3 mL Inhalation Q4H PRN Enid Baas, MD   3 mL at 10/02/15 0538  . levothyroxine (SYNTHROID, LEVOTHROID) tablet 50 mcg  50 mcg Oral QAC breakfast Ihor Austin, MD   50 mcg at 10/03/15 0950  . morphine 2 MG/ML injection 2 mg  2 mg Intravenous Q4H PRN Arnaldo Natal, MD   2 mg at 10/04/15 0816  . naloxone [facility]) injection 0.4 mg  0.4 mg Intravenous PRN Merwyn Katos, MD      . ondansetron [facility]) tablet 4 mg  4 mg Oral Q6H PRN Ihor Austin, MD       Or  . ondansetron (ZOFRAN) injection 4 mg  4 mg Intravenous Q6H PRN Ihor Austin, MD   4 mg at 10/02/15 0559  . pantoprazole (PROTONIX) EC tablet 40 mg  40 mg Oral QAC breakfast Ihor Austin, MD   40 mg at 10/03/15 0950  . potassium chloride SA (K-DUR,KLOR-CON) CR tablet 20 mEq  20 mEq Oral Daily Ihor Austin, MD   20 mEq at 10/03/15 0959  . predniSONE (DELTASONE) tablet 5 mg  5 mg Oral Q breakfast Ihor Austin, MD   5 mg at 10/03/15 0951  . senna-docusate (Senokot-S) tablet 1 tablet  1 tablet Oral QHS PRN Pavan Pyreddy, MD      . sodium chloride 0.9 % injection 3 mL  3 mL Intravenous Q12H Pavan Pyreddy, MD   3 mL at 10/03/15 2200  . tamsulosin (FLOMAX) capsule 0.4 mg  0.4 mg Oral Daily Ihor Austin, MD   0.4 mg at 10/03/15 0950  . Warfarin - Pharmacist Dosing Inpatient   Does not apply q1800 Gardner Candle, [facility]         Discharge Medications: Please see discharge summary for a list of discharge medications.  Relevant Imaging Results:  Relevant Lab Results:   Additional Information  (SSN:[id number])  Verta Ellen Zaviyar Rahal, LCSW

## 2015-10-04 NOTE — Progress Notes (Signed)
Speech Language Pathology Dysphagia Treatment Patient Details Name: Maureen Wilson MRN: [id number] DOB: [birth year] Today's Date: 10/04/2015 Time: [id number] SLP Time Calculation (min) (ACUTE ONLY): 33 min  Assessment / Plan / Recommendation Clinical Impression       Diet Recommendation       SLP Plan Continue with current plan of care   Pertinent Vitals/Pain No pain reported  Swallowing Goals     General Behavior/Cognition: Alert;Cooperative;Pleasant mood Patient Positioning: Upright in bed Oral care provided: N/A HPI: Pt admitted with fall but has had 2 choking episodes since admisssion. Family reports occasional dificulty at home over the past several months.  Oral Cavity - Oral Hygiene     Dysphagia Treatment Treatment Methods: Skilled observation;Upgraded PO texture trial;Patient/caregiver education;Compensation strategy training Patient observed directly with PO's: Yes Type of PO's observed: Dysphagia 3 (soft);Dysphagia 2 (chopped);Regular;Dysphagia 1 (puree);Thin liquids;Nectar-thick liquids;Honey-thick liquids;Ice chips Feeding: Able to feed self Liquids provided via: Teaspoon;Cup;Straw Oral Phase Signs & Symptoms: Anterior loss/spillage;Prolonged mastication Pharyngeal Phase Signs & Symptoms: Multiple swallows;Delayed cough;Immediate cough;Immediate throat clear Type of cueing: Verbal Amount of cueing: Modified independent   GO     Maureen PelStacie Harris Wilson 10/04/2015, 1:17 PM

## 2015-10-04 NOTE — Care Management Important Message (Signed)
Important Message  Patient Details  Name: Maureen Wilson MRN: [id number] Date of Birth: [birth year]   Medicare Important Message Given:  Yes    Collie SiadAngela Alleen Kehm, RN 10/04/2015, 7:59 AM

## 2015-10-04 NOTE — Progress Notes (Signed)
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 A fib. 2 L of oxygen. Pt reports a lot of pain and received Morphine and ultram. A & O. Son at the bedside. FS are stable. Takes meds crushed in apple sauce. Barium swallow was completed. Skin tear to R arm. K was IV replaced. Pt has no further concerns at this time.

## 2015-10-04 NOTE — Progress Notes (Signed)
ANTICOAGULATION CONSULT NOTE - Follow Up  Pharmacy Consult for warfarin  Indication: atrial fibrillation  Allergies  Allergen Reactions  . Amlodipine Other (See Comments)    10mg  causes swelling  . Hydralazine Other (See Comments)    Lip/mouth swelling  . Iohexol      Code: HIVES, Desc: pt developed hives after receiving iv dye for ct scan.  suggest she be premedicated for future exams, Onset Date: [id number]   . Metoprolol Other (See Comments)    Lip/mouth swelling  . Olmesartan Other (See Comments)    Oral edema  . Ramipril Swelling    Oral  . Sulfa Antibiotics Other (See Comments)    angioedema  . Telmisartan Other (See Comments)    Lips/mouth swelling  . Tiotropium Swelling   Patient Measurements: Height: 5\' 3"  (160 cm) Weight: 236 lb 8.9 oz (107.3 kg) IBW/kg (Calculated) : 52.4 Vital Signs: Temp: 97.8 F (36.6 C) (12/27 0621) Temp Source: Oral (12/27 0621) BP: 103/50 mmHg (12/27 0621) Pulse Rate: 79 (12/27 0621)  Recent Labs  10/02/15 0444 10/03/15 0227 10/04/15 0359  HGB 10.9*  --   --   HCT 32.7*  --   --   PLT 152  --   --   LABPROT 28.5* 41.8* 44.7*  INR 2.73 4.54* 4.96*  CREATININE 2.34* 2.75* 1.99*   Estimated Creatinine Clearance: 29.1 mL/min (by C-G formula based on Cr of 1.99).  Medical History: Past Medical History  Diagnosis Date  . COPD (chronic obstructive pulmonary disease) (HCC)   . Hypertension   . Atrial fibrillation (HCC)   . CHF (congestive heart failure) (HCC)   . Chronic kidney disease     Assessment: 74 yo female with a PMH of A.Fib. Has been taking warfarin 2mg  daily at home. Pharmacy now consulted for dosing and monitoring of warfarin dose.  Patients INR on admission (12/22) was 1.92. Patient did not receive a dose on 12/22, last reported dose was 12/21.  12/22  INR 1.92, no warfarin given. 12/23  (no INR)  Warfarin 3mg  given. 12/24  INR 1.79.  Warfarin 4 mg 12/25 INR 2.73 12/26 INR 4.54  Goal of Therapy:  INR  2-3 Monitor platelets by anticoagulation protocol: Yes   Plan:  INR Supratherapeutic today at 4.54. Will hold warfarin dose tonight.  PT/INR ordered with AM labs. Pharmacy will continue to follow and adjust as needed.   12/27 INR 4.96. Hold dose for today and recheck in AM.  Maureen Wilson, PharmD Pharmacy Resident 10/04/2015,6:26 AM

## 2015-10-04 NOTE — Progress Notes (Addendum)
Clinical Social Worker was informed by Dr. Tressia Miners that patient will be medically ready to discharge to [facility]. CSW met with patient and her son (Timmy) at bedside. CSW offered patient a semi-private room on the Rehab side of the facility vs a private room on the LTC side of the facility to patient and her son. CSW informed Patient and her son that the private room would not be an additional fee and contacted Maudie Mercury, admissions coordinator at [facility] to confirm. Patient and her son accepted the private room at [facility] and were ensured by CSW they would not be charged an additional fee for the private room per Maudie Mercury, admissions coordinator at [facility].   CSW informed Patient and her son that she would have to bring her CPAP to [facility] Patient's son reported that he understood and that he would gather patient's CPAP tonight.   Patient was originally Medicare Observation but switched to [facility] on 10-01-15. Patient has had a 3 night Medicare stay and has qualified for Medicare to pay for [facility] SNF placement for short term rehab. Patient and her son are  in a agreement with discharge plan. Patient and her son requested EMS to transport patient at [facility]. CSW called [facility] to accept private room (no additional fee) bed offer and to confirm that patient's bed will be ready tomorrow.    Ernest Pine, MSW, [place] Social Work Department [phone]

## 2015-10-05 LAB — BASIC METABOLIC PANEL
Anion gap: 9 (ref 5–15)
BUN: 43 mg/dL — ABNORMAL HIGH (ref 6–20)
CHLORIDE: 91 mmol/L — AB (ref 101–111)
CO2: 38 mmol/L — ABNORMAL HIGH (ref 22–32)
CREATININE: 1.51 mg/dL — AB (ref 0.44–1.00)
Calcium: 9 mg/dL (ref 8.9–10.3)
GFR, EST AFRICAN AMERICAN: 38 mL/min — AB (ref >60)
GFR, EST NON AFRICAN AMERICAN: 33 mL/min — AB (ref >60)
Glucose, Bld: 108 mg/dL — ABNORMAL HIGH (ref 65–99)
Potassium: 3.4 mmol/L — ABNORMAL LOW (ref 3.5–5.1)
SODIUM: 138 mmol/L (ref 135–145)

## 2015-10-05 LAB — GLUCOSE, CAPILLARY
GLUCOSE-CAPILLARY: 99 mg/dL (ref 65–99)
Glucose-Capillary: 141 mg/dL — ABNORMAL HIGH (ref 65–99)

## 2015-10-05 LAB — PROTIME-INR
INR: 3.74
Prothrombin Time: 36.1 seconds — ABNORMAL HIGH (ref 11.4–15.0)

## 2015-10-05 LAB — MAGNESIUM: MAGNESIUM: 2.1 mg/dL (ref 1.7–2.4)

## 2015-10-05 MED ORDER — FUROSEMIDE 40 MG PO TABS: 40.0000 mg | ORAL_TABLET | Freq: Every day | ORAL | Status: DC

## 2015-10-05 MED ORDER — POTASSIUM CHLORIDE CRYS ER 20 MEQ PO TBCR: 20.0000 meq | EXTENDED_RELEASE_TABLET | Freq: Once | ORAL | Status: DC

## 2015-10-05 MED ORDER — AMOXICILLIN-POT CLAVULANATE 875-125 MG PO TABS: 1.0000 | ORAL_TABLET | Freq: Two times a day (BID) | ORAL | Status: DC

## 2015-10-05 MED ORDER — MUPIROCIN 2 % EX OINT: 1.0000 "application " | TOPICAL_OINTMENT | Freq: Two times a day (BID) | CUTANEOUS | Status: DC

## 2015-10-05 MED ORDER — DOCUSATE SODIUM 100 MG PO CAPS
100.0000 mg | ORAL_CAPSULE | Freq: Two times a day (BID) | ORAL | Status: DC
  Administered 2015-10-05: 100 mg via ORAL
  Filled 2015-10-05: qty 1

## 2015-10-05 MED ORDER — DOCUSATE SODIUM 100 MG PO CAPS: 100.0000 mg | ORAL_CAPSULE | Freq: Two times a day (BID) | ORAL | Status: DC

## 2015-10-05 MED ORDER — SENNOSIDES-DOCUSATE SODIUM 8.6-50 MG PO TABS: 1.0000 | ORAL_TABLET | Freq: Every evening | ORAL | Status: DC | PRN

## 2015-10-05 NOTE — Progress Notes (Signed)
Removed telemetry and removed IV.  [facility] and awaiting ambulance to pick up patient.

## 2015-10-05 NOTE — Clinical Social Work Placement (Signed)
   CLINICAL SOCIAL WORK PLACEMENT  NOTE  Date:  10/05/2015  Patient Details  Name: Maureen Wilson MRN: [id number] Date of Birth: Aug 09, 1941  Clinical Social Work is seeking post-discharge placement for this patient at the Skilled  Nursing Facility level of care (*CSW will initial, date and re-position this form in  chart as items are completed):  Yes   Patient/family provided with [facility] Clinical Social Work Department's list of facilities offering this level of care within the geographic area requested by the patient (or if unable, by the patient's family).  Yes   Patient/family informed of their freedom to choose among providers that offer the needed level of care, that participate in Medicare, Medicaid or managed care program needed by the patient, have an available bed and are willing to accept the patient.  Yes   Patient/family informed of 's ownership interest in [facility] and [facility], as well as of the fact that they are under no obligation to receive care at these facilities.  PASRR submitted to EDS on       PASRR number received on       Existing PASRR number confirmed on 10/04/15     FL2 transmitted to all facilities in geographic area requested by pt/family on 10/04/15     FL2 transmitted to all facilities within larger geographic area on       Patient informed that his/her managed care company has contracts with or will negotiate with certain facilities, including the following:        Yes   Patient/family informed of bed offers received.  Patient chooses bed at  [facility] )     Physician recommends and patient chooses bed at      Patient to be transferred to  [facility] ) on 10/05/15.  Patient to be transferred to facility by  [facility] )     Patient family notified on 10/05/15 of transfer.  Name of family member notified:   (Patient's daughter [name] was at bedside and is aware of D/C today. )      PHYSICIAN       Additional Comment:    _______________________________________________ Haig ProphetMorgan, Pristine Gladhill G, LCSW 10/05/2015, 3:31 PM

## 2015-10-05 NOTE — Progress Notes (Signed)
MEDICATION RELATED CONSULT NOTE - INITIAL   Pharmacy Consult for electrolyte management Indication: hypokalemia  Labs:  Recent Labs  10/03/15 0227 10/04/15 0359 10/05/15 0536  CREATININE 2.75* 1.99* 1.51*  MG  --   --  2.1   Assessment: Pharmacy consulted to manage and replace electrolytes in this 74 year old female with hypokalemia. Patient had a K of 2.6 on the morning of 12/27 and received 4 runs of KCl 10 mEq IV along with 20 mEq PO K-Dur. Potassium level at 18:00 that evening had improved but remained low at 3.1. KCl 20mEq PO x 2 doses ordered. Patient continues on scheduled KCl 20mEq PO daily as well.  12/28 am labs K=3.4 and magnesium=2.1  Goal of Therapy:  Electrolytes within normal limits  Plan:  Give K-Dur 20 mEq PO x 1 extra dose tonight. Recheck K with AM labs tomorrow  Pharmacy will continue to monitor, thank you for the consult.  Clovia CuffLisa Amany Rando, PharmD, BCPS 10/05/2015 1:37 PM

## 2015-10-05 NOTE — Progress Notes (Signed)
PT Cancellation Note  Patient Details Name: Maureen Wilson MRN: [id number] DOB: 01-30-1941   Cancelled Treatment:    Reason Eval/Treat Not Completed: Other (comment). Treatment attempted this morning; pt with respiratory therapy. Re attempt treatment this afternoon as the schedule allows.    [name] 10/05/2015, 1:05 PM

## 2015-10-05 NOTE — Discharge Summary (Signed)
Maureen Wilson, is a 74 y.o. female  DOB March 11, 1941  MRN [id number].  Admission date:  09/29/2015  Admitting Physician  Ihor Austin, MD  Discharge Date:  10/05/2015   Primary MD  Joanna Hews, MD  Recommendations for primary care physician for things to follow:  Follow-up with primary doctor in 1 week   Admission Diagnosis  Musculoskeletal pain [M79.1] Fall, initial encounter [W19.XXXA]   Discharge Diagnosis  Musculoskeletal pain [M79.1] Fall, initial encounter [W19.XXXA]    Principal Problem:   Fall Active Problems:   Gait instability   Aspiration pneumonia [facility])      Past Medical History  Diagnosis Date  . COPD (chronic obstructive pulmonary disease) (HCC)   . Hypertension   . Atrial fibrillation (HCC)   . CHF (congestive heart failure) (HCC)   . Chronic kidney disease     Past Surgical History  Procedure Laterality Date  . Partial hip arthroplasty Right   . Total hip arthroplasty Left   . Abdominal hysterectomy         History of present illness and  Hospital Course:     Kindly see H&P for history of present illness and admission details, please review complete Labs, Consult reports and Test reports for all details in brief  HPI  from the history and physical done on the day of admission 74 year old female patient with history of COPD on home, hypertension chronic atrial fibrillation on Coumadin comes in because of the fall. Admitted for fall and right hip pain.  Hospital course: Fall and right hip pain: Negative for fracture. Physical therapy recommended rehabilitation. Patient is going to [facility] rehab. 2 . Apnea episodes secondary to narcotics that were given for hip pain. Patient responded to naloxone. monitored briefly in the ICU. #3. supratherapeutic INR: INR 4.54. Pharmacy  managing her dosing of Coumadin. No evidence of bleeding.  4.aspiration pneumonia: Patient received Zosyn and changed to Unasyn. WBC improved from 13 point 5-11.1. Patient had choking episodes no speech therapy recommended dysphagia diet. MBS did not show any stricture. #5 acute respiratory distress secondary to aspiration pneumonia, prolonged apneic spells: Patient received BiPAP temporarily. And she is on chronic thick liquids. Patient can continue CPAP at night. Apnea. #6 chronic atrial fibrillation rate controlled continue Coumadin, Cardizem. 7..Hypokalemia replace.d,improved, 8..Hyponatremia improved; Lasix, metolazone were  held because of hyponatremia and hypokalemia. #9. confusion due to acute delirium improved patient is alert, awake, oriented. 10.. Has constipation and uses stool softeners.   11. acute on chronic renal failure; baseline creatinine is 1.5. Patient received gentle hydration.      disharge Condition: stable     Follow-up Information    Follow up with [facility].   Specialty:  Skilled Nursing Facility   Contact information:   [address] [place] [address] [phone]      Follow up with Joanna Hews, MD In 1 week.   Specialty:  Family Medicine   Contact information:   [address] [place] [address] [phone]         Discharge Instructions  and  Discharge Medications   1. Dysphagia 2 diet with nectar thick liquids.      Medication List    TAKE these medications        albuterol (2.5 MG/3ML) 0.083% nebulizer solution  Commonly known as:  PROVENTIL  Inhale 3 mLs into the lungs every 6 (six) hours as needed.     albuterol-ipratropium 18-103 MCG/ACT inhaler  Commonly  known as:  COMBIVENT  Inhale 1-2 puffs into the lungs every 4 (four) hours.     ALPRAZolam 0.25 MG tablet  Commonly known as:  XANAX  Take 0.25 mg by mouth 3 (three) times daily as needed.      amoxicillin-clavulanate 875-125 MG tablet  Commonly known as:  AUGMENTIN  Take 1 tablet by mouth 2 (two) times daily.     aspirin EC 81 MG tablet  Take 81 mg by mouth daily.     atenolol 50 MG tablet  Commonly known as:  TENORMIN  Take 0.5 tablets by mouth 2 (two) times daily.     atorvastatin 20 MG tablet  Commonly known as:  LIPITOR  Take 1 tablet by mouth daily.     benzonatate 100 MG capsule  Commonly known as:  TESSALON  Take 1 capsule by mouth 2 (two) times daily as needed.     cetirizine 10 MG tablet  Commonly known as:  ZYRTEC  Take 1 tablet by mouth daily.     clonazePAM 0.5 MG tablet  Commonly known as:  KLONOPIN  Take 0.5-1 tablets by mouth 3 (three) times daily. Half a tablet in the Am and at [facility], and the 1 tablet at bedtime     D 2000 2000 units Tabs  Generic drug:  Cholecalciferol  Take 2,000 Units by mouth daily.     diltiazem 180 MG 24 hr capsule  Commonly known as:  CARDIZEM CD  Take 180 mg by mouth daily.     escitalopram 10 MG tablet  Commonly known as:  LEXAPRO  Take 10 mg by mouth daily.     esomeprazole 40 MG capsule  Commonly known as:  NEXIUM  Take by mouth 2 (two) times daily.     fluticasone 50 MCG/ACT nasal spray  Commonly known as:  FLONASE  Place 2 sprays into the nose daily.     Fluticasone-Salmeterol 250-50 MCG/DOSE Aepb  Commonly known as:  ADVAIR  Inhale 1 puff into the lungs 2 (two) times daily.     furosemide 40 MG tablet  Commonly known as:  LASIX  Take 1 tablet (40 mg total) by mouth daily.     levothyroxine 50 MCG tablet  Commonly known as:  SYNTHROID, LEVOTHROID  Take by mouth. Take 1 tablet daily except on Monday and Thursday, take 1.5 tablets     metolazone 2.5 MG tablet  Commonly known as:  ZAROXOLYN  Take 2.5 mg by mouth 2 (two) times a week. On Monday and Thursday     montelukast 10 MG tablet  Commonly known as:  SINGULAIR  TAKE ONE TABLET AT BEDTIME.     mupirocin ointment 2 %  Commonly known as:   BACTROBAN  Place 1 application into the nose 2 (two) times daily.     nystatin 100000 UNIT/ML suspension  Commonly known as:  MYCOSTATIN  Take 5 mLs by mouth 4 (four) times daily.     ondansetron 4 MG tablet  Commonly known as:  ZOFRAN  Take 4 mg by mouth every 8 (eight) hours as needed.     potassium chloride SA 20 MEQ tablet  Commonly known as:  K-DUR,KLOR-CON  Take 20 mEq by mouth daily.     predniSONE 5 MG tablet  Commonly known as:  DELTASONE  Take 5 mg by mouth daily with breakfast.     risperiDONE 0.5 MG tablet  Commonly known as:  RISPERDAL  Take 0.5 mg by mouth daily.  senna-docusate 8.6-50 MG tablet  Commonly known as:  Senokot-S  Take 1 tablet by mouth at bedtime as needed for mild constipation.     SYMBICORT 160-4.5 MCG/ACT inhaler  Generic drug:  budesonide-formoterol  Inhale 2 puffs into the lungs 2 (two) times daily.     tamsulosin 0.4 MG Caps capsule  Commonly known as:  FLOMAX  Take 0.4 mg by mouth daily.     traMADol 50 MG tablet  Commonly known as:  ULTRAM  Take 50 mg by mouth every 6 (six) hours as needed.     warfarin 2 MG tablet  Commonly known as:  COUMADIN  TAKE ONE (1) TABLET BY MOUTH ONCE DAILY          Diet and Activity recommendation: See Discharge Instructions above   Consults obtained -speech therapy.critical care Speech therapy   Major procedures and Radiology Reports - PLEASE review detailed and final reports for all details, in brief -    Dg Chest 2 View  09/29/2015  CLINICAL DATA:  Recent fall. Hematoma on the head. Patient is on blood thinners. EXAM: CHEST  2 VIEW COMPARISON:  12/18/2014 and 12/19/2014 FINDINGS: Chronic linear densities in the left mid lung and left lower chest. Findings are suggestive for scarring. Heart size is upper limits of normal but unchanged. Negative for a pneumothorax. Stable large calcification in the right upper abdomen. No acute bone abnormality. IMPRESSION: No acute findings. Scarring  and/or atelectasis in the left lung. Electronically Signed   By: Richarda Overlie M.D.   On: 09/29/2015 18:14   Dg Ribs Unilateral Right  09/30/2015  CLINICAL DATA:  Pain, fell EXAM: RIGHT RIBS - 2 VIEW COMPARISON:  09/29/2015 FINDINGS: Stable cardiomegaly. Tortuous atheromatous aorta. Some increase in coarse left infrahilar atelectasis or infiltrates. No effusion. No pneumothorax. Spondylitic changes in the lower thoracic spine. Minimally displaced fracture, anterolateral aspect right fifth rib. Old anterolateral right ninth rib fracture with callus. IMPRESSION: 1. Right fifth rib fracture without pneumothorax. Electronically Signed   By: Corlis Leak M.D.   On: 09/30/2015 14:58   Dg Pelvis 1-2 Views  09/29/2015  CLINICAL DATA:  Fall. EXAM: PELVIS - 1-2 VIEW COMPARISON:  12/18/2014 FINDINGS: Again noted are bilateral hip replacements which are grossly intact. Surgical changes in the right pelvic region. Numerous pelvic calcifications are similar to the prior examination. Degenerative disc disease in the lower lumbar spine. Question old fractures involving the left pubic rami. Pelvic bony ring appears to be intact without an acute fracture. The femoral prostheses are incompletely imaged. IMPRESSION: No acute abnormality in the pelvis. Electronically Signed   By: Richarda Overlie M.D.   On: 09/29/2015 18:17   Ct Head Wo Contrast  09/29/2015  CLINICAL DATA:  Status post fall with a blow to the left side of the head. Anticoagulated patient. Initial encounter. EXAM: CT HEAD WITHOUT CONTRAST CT CERVICAL SPINE WITHOUT CONTRAST TECHNIQUE: Multidetector CT imaging of the head and cervical spine was performed following the standard protocol without intravenous contrast. Multiplanar CT image reconstructions of the cervical spine were also generated. COMPARISON:  Head CT scan 12/18/2014. Head and cervical spine CT scan 11/02/2013. 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 No evidence of acute intracranial abnormality including hemorrhage, infarct, mass lesion, mass effect, midline shift or abnormal extra-axial fluid collection is identified. There is no hydrocephalus or pneumocephalus. The calvarium is intact. Imaged paranasal sinuses and mastoid air cells  are clear. CT CERVICAL SPINE FINDINGS No fracture is identified. Trace anterolisthesis C4 on C5 due to facet arthropathy is noted. Loss of disc space height appears worst at C5-6 and C6-7. Lung apices are clear. IMPRESSION: No acute abnormality head or cervical spine. Atrophy and chronic microvascular ischemic change. Cervical spondylosis. Electronically Signed   By: Drusilla Kanner M.D.   On: 09/29/2015 18:20   Ct Cervical Spine Wo Contrast  09/29/2015  CLINICAL DATA:  Status post fall with a blow to the left side of the head. Anticoagulated patient. Initial encounter. EXAM: CT HEAD WITHOUT CONTRAST CT CERVICAL SPINE WITHOUT CONTRAST TECHNIQUE: Multidetector CT imaging of the head and cervical spine was performed following the standard protocol without intravenous contrast. Multiplanar CT image reconstructions of the cervical spine were also generated. COMPARISON:  Head CT scan 12/18/2014. Head and cervical spine CT scan 11/02/2013. 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 No evidence of acute intracranial abnormality including hemorrhage, infarct, mass lesion, mass effect, midline shift or abnormal extra-axial fluid collection is identified. There is no hydrocephalus or pneumocephalus. The calvarium is intact. Imaged paranasal sinuses and mastoid air cells are clear. CT CERVICAL SPINE FINDINGS No fracture is identified. Trace anterolisthesis C4 on C5 due to facet arthropathy is noted. Loss of disc space height appears worst at C5-6 and C6-7. Lung apices are clear.  IMPRESSION: No acute abnormality head or cervical spine. Atrophy and chronic microvascular ischemic change. Cervical spondylosis. Electronically Signed   By: Drusilla Kanner M.D.   On: 09/29/2015 18:20   Ct Pelvis Wo Contrast  09/30/2015  CLINICAL DATA:  Status post fall on patient's right side with bruising on clinical exam. Patient is on anticoagulation therapy. EXAM: CT PELVIS WITHOUT CONTRAST TECHNIQUE: Multidetector CT imaging of the pelvis was performed following the standard protocol without intravenous contrast. COMPARISON:  Radiograph of the pelvis dated 09/27/2015 FINDINGS: There is diffuse osteopenia. There is no evidence of pelvic or hip fracture. Bilateral hip prostheses are intact and in good alignment. Osteoarthritic changes are seen of the lower lumbosacral spine with disc space narrowing, vacuum phenomenon, remodeling of the vertebral bodies. Similar in severity changes seen in the posterior elements. There are changes from right anterior wall hernia repair. Soft tissues are otherwise grossly unremarkable. IMPRESSION: No evidence of fracture of the pelvis or hips, status post bilateral hip arthroplasty. Osteopenia. 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 Electronically Signed   By: Ted Mcalpine M.D.   On: 09/30/2015 14:53   Dg Esophagus  10/04/2015  CLINICAL DATA:  Coughing.  Aspiration. EXAM: ESOPHOGRAM / BARIUM SWALLOW / BARIUM TABLET STUDY TECHNIQUE: Combined double contrast and single contrast examination performed using swallowed air and thin barium liquid. The patient was observed with fluoroscopy swallowing a 13 mm barium sulphate tablet. FLUOROSCOPY TIME:  Fluoroscopy Time:  2 minutes 0 seconds COMPARISON:  None. FINDINGS: The patient ingested thin barium in the semi recumbent position. There was no aspiration during the exam. The mucosa and motility of the esophagus appear normal except for slight tertiary contractions in the mid  esophagus. No hiatal hernia. A 13 mm barium tablet passed from the mouth to the stomach with no significant delay. Linear atelectasis is noted in the right perihilar region as well as in the left lower lobe in the left midzone. Calcified granuloma at the right lung base. IMPRESSION: Minimal esophageal motility disorder with occasional tertiary contractions. Otherwise  normal exam. Specifically, no evidence of aspiration even with thin barium. Electronically Signed   By: Francene Boyers M.D.   On: 10/04/2015 12:46   Dg Chest Port 1 View  10/03/2015  CLINICAL DATA:  Shortness breath and cough since admission after falling 4 days ago. EXAM: PORTABLE CHEST 1 VIEW COMPARISON:  10/01/2015 and 09/30/2015. FINDINGS: 1040 hours. There are progressively lower lung volumes with similar basilar and perihilar atelectasis bilaterally. No definite edema or significant pleural effusion. The heart size and mediastinal contours are stable. The heart is mildly enlarged. IMPRESSION: Cardiomegaly with lower lung volumes and increased atelectasis bilaterally. No definite edema. Electronically Signed   By: Carey Bullocks M.D.   On: 10/03/2015 11:00   Dg Chest Port 1 View  10/01/2015  CLINICAL DATA:  Possible aspiration. EXAM: PORTABLE CHEST 1 VIEW COMPARISON:  09/29/2015 FINDINGS: Cardiac enlargement is identified. There is aortic atherosclerosis noted. No pleural effusion identified. Decreased lung volumes. Atelectasis within the left midlung and left base is identified. IMPRESSION: 1. Decreased lung volumes. 2. Left midlung and left base atelectasis. Electronically Signed   By: Signa Kell M.D.   On: 10/01/2015 11:33    Micro Results    Recent Results (from the past 240 hour(s))  MRSA PCR Screening     Status: Abnormal   Collection Time: 09/30/15 10:02 AM  Result Value Ref Range Status   MRSA by PCR POSITIVE (A) NEGATIVE Final    Comment: CRITICAL RESULT CALLED TO, READ BACK BY AND VERIFIED WITH: SHAYE BREWER [id number]  Sep 30 2015.sfw        The GeneXpert MRSA Assay (FDA approved for NASAL specimens only), is one component of a comprehensive MRSA colonization surveillance program. It is not intended to diagnose MRSA infection nor to guide or monitor treatment for MRSA infections.        Today   Subjective:   Maureen Wilson today has no headache,no chest abdominal pain,no new weakness tingling or numbness, feels much better , stable to go to rehab today.  Objective:   Blood pressure 104/40, pulse 96, temperature 98.1 F (36.7 C), temperature source Oral, resp. rate 18, height 5\' 3"  (1.6 m), weight 107.3 kg (236 lb 8.9 oz), SpO2 95 %.   Intake/Output Summary (Last 24 hours) at 10/05/15 1343 Last data filed at 10/05/15 0705  Gross per 24 hour  Intake    103 ml  Output    800 ml  Net   -697 ml    Exam Awake Alert, Oriented x 3, No new F.N deficits, Normal affect [place].AT,PERRAL Supple Neck,No JVD, No cervical lymphadenopathy appriciated.  Symmetrical Chest wall movement, Good air movement bilaterally, CTAB RRR,No Gallops,Rubs or new Murmurs, No Parasternal Heave +ve B.Sounds, Abd Soft, Non tender, No organomegaly appriciated, No rebound -guarding or rigidity. No Cyanosis, Clubbing or edema, No new Rash or bruise  Data Review   CBC w Diff: Lab Results  Component Value Date   WBC 11.1* 10/02/2015   WBC 14.1* 06/26/2014   HGB 10.9* 10/02/2015   HGB 13.3 06/26/2014   HCT 32.7* 10/02/2015   HCT 40.3 06/26/2014   PLT 152 10/02/2015   PLT 190 06/26/2014   LYMPHOPCT 7 09/29/2015   LYMPHOPCT 9.9 11/02/2013   MONOPCT 7 09/29/2015   MONOPCT 9.3 11/02/2013   EOSPCT 0 09/29/2015   EOSPCT 0.6 11/02/2013   BASOPCT 1 09/29/2015   BASOPCT 0.7 11/02/2013    CMP: Lab Results  Component Value Date   NA 138 10/05/2015   NA  135* 06/26/2014   K 3.4* 10/05/2015   K 3.7 06/26/2014   CL 91* 10/05/2015   CL 106 06/26/2014   CO2 38* 10/05/2015   CO2 22 06/26/2014   BUN 43* 10/05/2015    BUN 31* 06/26/2014   CREATININE 1.51* 10/05/2015   CREATININE 1.57* 06/26/2014   PROT 8.0 09/29/2015   PROT 6.5 06/26/2014   ALBUMIN 4.5 09/29/2015   ALBUMIN 3.4 06/26/2014   BILITOT 1.5* 09/29/2015   BILITOT 0.9 06/26/2014   ALKPHOS 108 09/29/2015   ALKPHOS 67 06/26/2014   AST 39 09/29/2015   AST 60* 06/26/2014   ALT 26 09/29/2015   ALT 50 06/26/2014  .   Total Time in preparing paper work, data evaluation and todays exam - 35 minutes  Elior Robinette M.D on 10/05/2015 at 1:43 PM    Note: This dictation was prepared with Dragon dictation along with smaller phrase technology. Any transcriptional errors that result from this process are unintentional.

## 2015-10-05 NOTE — Progress Notes (Signed)
ANTICOAGULATION CONSULT NOTE - Follow Up  Pharmacy Consult for warfarin  Indication: atrial fibrillation  Allergies  Allergen Reactions  . Amlodipine Other (See Comments)    10mg  causes swelling  . Hydralazine Other (See Comments)    Lip/mouth swelling  . Iohexol      Code: HIVES, Desc: pt developed hives after receiving iv dye for ct scan.  suggest she be premedicated for future exams, Onset Date: [id number]   . Metoprolol Other (See Comments)    Lip/mouth swelling  . Olmesartan Other (See Comments)    Oral edema  . Ramipril Swelling    Oral  . Sulfa Antibiotics Other (See Comments)    angioedema  . Telmisartan Other (See Comments)    Lips/mouth swelling  . Tiotropium Swelling   Patient Measurements: Height: 5\' 3"  (160 cm) Weight: 236 lb 8.9 oz (107.3 kg) IBW/kg (Calculated) : 52.4 Vital Signs: Temp: 98.1 F (36.7 C) (12/28 1210) Temp Source: Oral (12/28 1210) BP: 104/40 mmHg (12/28 1210) Pulse Rate: 96 (12/28 1210)  Recent Labs  10/03/15 0227 10/04/15 0359 10/05/15 0536  LABPROT 41.8* 44.7* 36.1*  INR 4.54* 4.96* 3.74  CREATININE 2.75* 1.99* 1.51*   Estimated Creatinine Clearance: 38.4 mL/min (by C-G formula based on Cr of 1.51).  Medical History: Past Medical History  Diagnosis Date  . COPD (chronic obstructive pulmonary disease) (HCC)   . Hypertension   . Atrial fibrillation (HCC)   . CHF (congestive heart failure) (HCC)   . Chronic kidney disease     Assessment: 74 yo female with a PMH of A.Fib. Has been taking warfarin 2mg  daily at home. Pharmacy now consulted for dosing and monitoring of warfarin dose.  Patients INR on admission (12/22) was 1.92. Patient did not receive a dose on 12/22, last reported dose was 12/21.  12/22  INR 1.92, no warfarin given. 12/23  (no INR)  Warfarin 3mg  given. 12/24  INR 1.79.  Warfarin 4 mg 12/25 INR 2.73 12/26 INR 4.54 12/27 INR 4.96 12/28 INR 3.74  Goal of Therapy:  INR 2-3 Monitor platelets by  anticoagulation protocol: Yes   Plan:  INR Supratherapeutic today at 3.74. Will hold warfarin dose tonight.  PT/INR ordered with AM labs. Pharmacy will continue to follow and adjust as needed.   Clovia CuffLisa Rayvin Abid, PharmD, BCPS 10/05/2015 1:39 PM

## 2015-10-05 NOTE — Progress Notes (Addendum)
Clinical Social Worker (CSW) was informed by Dr. Luberta MutterKonidena that patient will be medically [facility] Patient and her daughter [name] are in a agreement with plan. CSW called [facility] to confirm that patient's bed is ready. Per [facility]'s admission counselor, Selena BattenKim patient will be in room number 203-B and number to call for report [phone] . [facility]. Rx's added to discharge packet. CSW informed RN that patient is ready for discharge RN will call report and [facility].   Woodroe Modehristina Kimmarie Pascale, MSW, LCSW-A Clinical Social Work Department [phone]

## 2015-10-07 LAB — PROTIME-INR
INR: 2.3
Prothrombin Time: 25.1 seconds — ABNORMAL HIGH (ref 11.4–15.0)

## 2015-10-08 LAB — CBC WITH DIFFERENTIAL/PLATELET
Basophils Absolute: 0.1 K/uL (ref 0–0.1)
Basophils Relative: 1 %
Eosinophils Absolute: 0.3 K/uL (ref 0–0.7)
Eosinophils Relative: 3 %
HCT: 33.9 % — ABNORMAL LOW (ref 35.0–47.0)
Hemoglobin: 11.1 g/dL — ABNORMAL LOW (ref 12.0–16.0)
Lymphocytes Relative: 14 %
Lymphs Abs: 1.3 K/uL (ref 1.0–3.6)
MCH: 30 pg (ref 26.0–34.0)
MCHC: 32.7 g/dL (ref 32.0–36.0)
MCV: 91.8 fL (ref 80.0–100.0)
Monocytes Absolute: 0.8 K/uL (ref 0.2–0.9)
Monocytes Relative: 9 %
Neutro Abs: 6.8 K/uL — ABNORMAL HIGH (ref 1.4–6.5)
Neutrophils Relative %: 73 %
Platelets: 210 K/uL (ref 150–440)
RBC: 3.69 MIL/uL — ABNORMAL LOW (ref 3.80–5.20)
RDW: 15.9 % — ABNORMAL HIGH (ref 11.5–14.5)
WBC: 9.2 K/uL (ref 3.6–11.0)

## 2015-10-08 LAB — COMPREHENSIVE METABOLIC PANEL WITH GFR
ALT: 19 U/L (ref 14–54)
AST: 25 U/L (ref 15–41)
Albumin: 3.3 g/dL — ABNORMAL LOW (ref 3.5–5.0)
Alkaline Phosphatase: 130 U/L — ABNORMAL HIGH (ref 38–126)
Anion gap: 13 (ref 5–15)
BUN: 30 mg/dL — ABNORMAL HIGH (ref 6–20)
CO2: 33 mmol/L — ABNORMAL HIGH (ref 22–32)
Calcium: 9.2 mg/dL (ref 8.9–10.3)
Chloride: 92 mmol/L — ABNORMAL LOW (ref 101–111)
Creatinine, Ser: 1.22 mg/dL — ABNORMAL HIGH (ref 0.44–1.00)
GFR calc Af Amer: 49 mL/min — ABNORMAL LOW
GFR calc non Af Amer: 43 mL/min — ABNORMAL LOW
Glucose, Bld: 98 mg/dL (ref 65–99)
Potassium: 2.9 mmol/L — CL (ref 3.5–5.1)
Sodium: 138 mmol/L (ref 135–145)
Total Bilirubin: 1.3 mg/dL — ABNORMAL HIGH (ref 0.3–1.2)
Total Protein: 6.6 g/dL (ref 6.5–8.1)

## 2015-10-08 LAB — PROTIME-INR
INR: 2
Prothrombin Time: 22.6 s — ABNORMAL HIGH (ref 11.4–15.0)

## 2015-10-10 ENCOUNTER — Encounter
Admission: RE | Admit: 2015-10-10 | Discharge: 2015-10-10 | Disposition: A | Discharge status: Home / Self Care | Payer: Medicare Other | Source: Ambulatory Visit | Attending: Internal Medicine | Admitting: Internal Medicine

## 2015-10-11 LAB — PROTIME-INR
INR: 2.53
Prothrombin Time: 26.9 seconds — ABNORMAL HIGH (ref 11.4–15.0)

## 2015-10-11 LAB — COMPREHENSIVE METABOLIC PANEL
ALK PHOS: 258 U/L — AB (ref 38–126)
ALT: 20 U/L (ref 14–54)
ANION GAP: 12 (ref 5–15)
AST: 27 U/L (ref 15–41)
Albumin: 3.7 g/dL (ref 3.5–5.0)
BILIRUBIN TOTAL: 1.2 mg/dL (ref 0.3–1.2)
BUN: 24 mg/dL — ABNORMAL HIGH (ref 6–20)
CALCIUM: 9.3 mg/dL (ref 8.9–10.3)
CO2: 28 mmol/L (ref 22–32)
CREATININE: 1.36 mg/dL — AB (ref 0.44–1.00)
Chloride: 94 mmol/L — ABNORMAL LOW (ref 101–111)
GFR calc non Af Amer: 37 mL/min — ABNORMAL LOW (ref >60)
GFR, EST AFRICAN AMERICAN: 43 mL/min — AB (ref >60)
GLUCOSE: 148 mg/dL — AB (ref 65–99)
Potassium: 3.6 mmol/L (ref 3.5–5.1)
SODIUM: 134 mmol/L — AB (ref 135–145)
TOTAL PROTEIN: 7.2 g/dL (ref 6.5–8.1)

## 2015-10-11 LAB — CBC WITH DIFFERENTIAL/PLATELET
BASOS ABS: 0 10*3/uL (ref 0–0.1)
Basophils Relative: 0 %
EOS PCT: 2 %
Eosinophils Absolute: 0.2 10*3/uL (ref 0–0.7)
HEMATOCRIT: 34.1 % — AB (ref 35.0–47.0)
Hemoglobin: 11.1 g/dL — ABNORMAL LOW (ref 12.0–16.0)
LYMPHS ABS: 1.3 10*3/uL (ref 1.0–3.6)
LYMPHS PCT: 12 %
MCH: 29.8 pg (ref 26.0–34.0)
MCHC: 32.6 g/dL (ref 32.0–36.0)
MCV: 91.5 fL (ref 80.0–100.0)
MONO ABS: 0.8 10*3/uL (ref 0.2–0.9)
MONOS PCT: 7 %
NEUTROS ABS: 8.4 10*3/uL — AB (ref 1.4–6.5)
Neutrophils Relative %: 79 %
PLATELETS: 264 10*3/uL (ref 150–440)
RBC: 3.73 MIL/uL — ABNORMAL LOW (ref 3.80–5.20)
RDW: 16.2 % — AB (ref 11.5–14.5)
WBC: 10.8 10*3/uL (ref 3.6–11.0)

## 2015-10-13 LAB — PROTIME-INR
INR: 2.87
PROTHROMBIN TIME: 29.6 s — AB (ref 11.4–15.0)

## 2015-10-18 LAB — BASIC METABOLIC PANEL
ANION GAP: 11 (ref 5–15)
BUN: 27 mg/dL — AB (ref 6–20)
CO2: 29 mmol/L (ref 22–32)
Calcium: 9 mg/dL (ref 8.9–10.3)
Chloride: 94 mmol/L — ABNORMAL LOW (ref 101–111)
Creatinine, Ser: 1.24 mg/dL — ABNORMAL HIGH (ref 0.44–1.00)
GFR, EST AFRICAN AMERICAN: 48 mL/min — AB (ref >60)
GFR, EST NON AFRICAN AMERICAN: 42 mL/min — AB (ref >60)
Glucose, Bld: 86 mg/dL (ref 65–99)
POTASSIUM: 3.5 mmol/L (ref 3.5–5.1)
SODIUM: 134 mmol/L — AB (ref 135–145)

## 2015-10-18 LAB — PROTIME-INR
INR: 2.88
Prothrombin Time: 29.7 seconds — ABNORMAL HIGH (ref 11.4–15.0)

## 2016-01-18 ENCOUNTER — Emergency Department: Payer: Medicare Other

## 2016-01-18 ENCOUNTER — Encounter: Payer: Self-pay | Admitting: Emergency Medicine

## 2016-01-18 ENCOUNTER — Inpatient Hospital Stay
Admission: EM | Admit: 2016-01-18 | Discharge: 2016-01-21 | DRG: 683 | Disposition: A | Discharge status: Home Health | Payer: Medicare Other | Attending: Internal Medicine | Admitting: Internal Medicine

## 2016-01-18 DIAGNOSIS — E876 Hypokalemia: Secondary | ICD-10-CM | POA: Diagnosis present

## 2016-01-18 DIAGNOSIS — N179 Acute kidney failure, unspecified: Secondary | ICD-10-CM | POA: Diagnosis not present

## 2016-01-18 DIAGNOSIS — I5032 Chronic diastolic (congestive) heart failure: Secondary | ICD-10-CM | POA: Diagnosis present

## 2016-01-18 DIAGNOSIS — E871 Hypo-osmolality and hyponatremia: Secondary | ICD-10-CM | POA: Diagnosis present

## 2016-01-18 DIAGNOSIS — Z792 Long term (current) use of antibiotics: Secondary | ICD-10-CM

## 2016-01-18 DIAGNOSIS — Z7901 Long term (current) use of anticoagulants: Secondary | ICD-10-CM | POA: Diagnosis not present

## 2016-01-18 DIAGNOSIS — Z7982 Long term (current) use of aspirin: Secondary | ICD-10-CM | POA: Diagnosis not present

## 2016-01-18 DIAGNOSIS — R531 Weakness: Secondary | ICD-10-CM

## 2016-01-18 DIAGNOSIS — Y92009 Unspecified place in unspecified non-institutional (private) residence as the place of occurrence of the external cause: Secondary | ICD-10-CM | POA: Diagnosis not present

## 2016-01-18 DIAGNOSIS — F4321 Adjustment disorder with depressed mood: Secondary | ICD-10-CM

## 2016-01-18 DIAGNOSIS — N183 Chronic kidney disease, stage 3 (moderate): Secondary | ICD-10-CM | POA: Diagnosis present

## 2016-01-18 DIAGNOSIS — Z9981 Dependence on supplemental oxygen: Secondary | ICD-10-CM

## 2016-01-18 DIAGNOSIS — I130 Hypertensive heart and chronic kidney disease with heart failure and stage 1 through stage 4 chronic kidney disease, or unspecified chronic kidney disease: Secondary | ICD-10-CM | POA: Diagnosis present

## 2016-01-18 DIAGNOSIS — J9610 Chronic respiratory failure, unspecified whether with hypoxia or hypercapnia: Secondary | ICD-10-CM | POA: Diagnosis present

## 2016-01-18 DIAGNOSIS — W1830XA Fall on same level, unspecified, initial encounter: Secondary | ICD-10-CM | POA: Diagnosis present

## 2016-01-18 DIAGNOSIS — Z882 Allergy status to sulfonamides status: Secondary | ICD-10-CM | POA: Diagnosis not present

## 2016-01-18 DIAGNOSIS — F331 Major depressive disorder, recurrent, moderate: Secondary | ICD-10-CM

## 2016-01-18 DIAGNOSIS — F419 Anxiety disorder, unspecified: Secondary | ICD-10-CM | POA: Diagnosis present

## 2016-01-18 DIAGNOSIS — T501X5A Adverse effect of loop [high-ceiling] diuretics, initial encounter: Secondary | ICD-10-CM | POA: Diagnosis present

## 2016-01-18 DIAGNOSIS — I480 Paroxysmal atrial fibrillation: Secondary | ICD-10-CM | POA: Diagnosis present

## 2016-01-18 DIAGNOSIS — I959 Hypotension, unspecified: Secondary | ICD-10-CM | POA: Diagnosis present

## 2016-01-18 DIAGNOSIS — E785 Hyperlipidemia, unspecified: Secondary | ICD-10-CM | POA: Diagnosis present

## 2016-01-18 DIAGNOSIS — Z8249 Family history of ischemic heart disease and other diseases of the circulatory system: Secondary | ICD-10-CM | POA: Diagnosis not present

## 2016-01-18 DIAGNOSIS — Z7952 Long term (current) use of systemic steroids: Secondary | ICD-10-CM | POA: Diagnosis not present

## 2016-01-18 DIAGNOSIS — Z79899 Other long term (current) drug therapy: Secondary | ICD-10-CM | POA: Diagnosis not present

## 2016-01-18 DIAGNOSIS — Z96643 Presence of artificial hip joint, bilateral: Secondary | ICD-10-CM | POA: Diagnosis present

## 2016-01-18 DIAGNOSIS — A419 Sepsis, unspecified organism: Secondary | ICD-10-CM | POA: Diagnosis present

## 2016-01-18 DIAGNOSIS — E663 Overweight: Secondary | ICD-10-CM | POA: Diagnosis present

## 2016-01-18 DIAGNOSIS — Z7951 Long term (current) use of inhaled steroids: Secondary | ICD-10-CM | POA: Diagnosis not present

## 2016-01-18 DIAGNOSIS — Z888 Allergy status to other drugs, medicaments and biological substances status: Secondary | ICD-10-CM | POA: Diagnosis not present

## 2016-01-18 DIAGNOSIS — J449 Chronic obstructive pulmonary disease, unspecified: Secondary | ICD-10-CM | POA: Diagnosis present

## 2016-01-18 DIAGNOSIS — Z8709 Personal history of other diseases of the respiratory system: Secondary | ICD-10-CM

## 2016-01-18 DIAGNOSIS — Z9109 Other allergy status, other than to drugs and biological substances: Secondary | ICD-10-CM

## 2016-01-18 DIAGNOSIS — T502X5A Adverse effect of carbonic-anhydrase inhibitors, benzothiadiazides and other diuretics, initial encounter: Secondary | ICD-10-CM | POA: Diagnosis present

## 2016-01-18 DIAGNOSIS — R110 Nausea: Secondary | ICD-10-CM

## 2016-01-18 DIAGNOSIS — E039 Hypothyroidism, unspecified: Secondary | ICD-10-CM | POA: Diagnosis present

## 2016-01-18 DIAGNOSIS — E860 Dehydration: Secondary | ICD-10-CM | POA: Diagnosis present

## 2016-01-18 HISTORY — DX: Chronic respiratory failure, unspecified whether with hypoxia or hypercapnia: J96.10

## 2016-01-18 LAB — TROPONIN I: TROPONIN I: 0.03 ng/mL (ref <0.031)

## 2016-01-18 LAB — URINALYSIS COMPLETE WITH MICROSCOPIC (ARMC ONLY)
Bilirubin Urine: NEGATIVE
GLUCOSE, UA: NEGATIVE mg/dL
HGB URINE DIPSTICK: NEGATIVE
KETONES UR: NEGATIVE mg/dL
LEUKOCYTES UA: NEGATIVE
Nitrite: NEGATIVE
Protein, ur: NEGATIVE mg/dL
RBC / HPF: NONE SEEN RBC/hpf (ref 0–5)
SPECIFIC GRAVITY, URINE: 1.005 (ref 1.005–1.030)
WBC, UA: NONE SEEN WBC/hpf (ref 0–5)
pH: 7 (ref 5.0–8.0)

## 2016-01-18 LAB — BASIC METABOLIC PANEL
ANION GAP: 12 (ref 5–15)
BUN: 33 mg/dL — AB (ref 6–20)
CALCIUM: 9.3 mg/dL (ref 8.9–10.3)
CO2: 33 mmol/L — ABNORMAL HIGH (ref 22–32)
Chloride: 84 mmol/L — ABNORMAL LOW (ref 101–111)
Creatinine, Ser: 2.73 mg/dL — ABNORMAL HIGH (ref 0.44–1.00)
GFR calc Af Amer: 19 mL/min — ABNORMAL LOW (ref >60)
GFR, EST NON AFRICAN AMERICAN: 16 mL/min — AB (ref >60)
Glucose, Bld: 146 mg/dL — ABNORMAL HIGH (ref 65–99)
POTASSIUM: 3.3 mmol/L — AB (ref 3.5–5.1)
Sodium: 129 mmol/L — ABNORMAL LOW (ref 135–145)

## 2016-01-18 LAB — CBC
HEMATOCRIT: 32 % — AB (ref 35.0–47.0)
HEMOGLOBIN: 10.5 g/dL — AB (ref 12.0–16.0)
MCH: 28.4 pg (ref 26.0–34.0)
MCHC: 32.9 g/dL (ref 32.0–36.0)
MCV: 86.4 fL (ref 80.0–100.0)
Platelets: 233 10*3/uL (ref 150–440)
RBC: 3.7 MIL/uL — ABNORMAL LOW (ref 3.80–5.20)
RDW: 16.2 % — AB (ref 11.5–14.5)
WBC: 7.9 10*3/uL (ref 3.6–11.0)

## 2016-01-18 LAB — GLUCOSE, CAPILLARY: GLUCOSE-CAPILLARY: 134 mg/dL — AB (ref 65–99)

## 2016-01-18 LAB — PROTIME-INR
INR: 4.06 — AB
Prothrombin Time: 38.2 seconds — ABNORMAL HIGH (ref 11.4–15.0)

## 2016-01-18 LAB — APTT: APTT: 41 s — AB (ref 24–36)

## 2016-01-18 LAB — MAGNESIUM: Magnesium: 2.1 mg/dL (ref 1.7–2.4)

## 2016-01-18 LAB — LACTIC ACID, PLASMA: LACTIC ACID, VENOUS: 2.2 mmol/L — AB (ref 0.5–2.0)

## 2016-01-18 MED ORDER — ALBUTEROL SULFATE (2.5 MG/3ML) 0.083% IN NEBU: 2.5000 mg | INHALATION_SOLUTION | RESPIRATORY_TRACT | Status: DC | PRN

## 2016-01-18 MED ORDER — ACETAMINOPHEN 650 MG RE SUPP: 650.0000 mg | Freq: Four times a day (QID) | RECTAL | Status: DC | PRN

## 2016-01-18 MED ORDER — DILTIAZEM HCL ER 180 MG PO CP24
180.0000 mg | ORAL_CAPSULE | Freq: Every day | ORAL | Status: DC
  Filled 2016-01-18 (×2): qty 1

## 2016-01-18 MED ORDER — DOCUSATE SODIUM 100 MG PO CAPS: 100.0000 mg | ORAL_CAPSULE | Freq: Two times a day (BID) | ORAL | Status: DC

## 2016-01-18 MED ORDER — POTASSIUM CHLORIDE IN NACL 20-0.9 MEQ/L-% IV SOLN
INTRAVENOUS | Status: DC
  Administered 2016-01-18: 1000 mL via INTRAVENOUS
  Administered 2016-01-19 (×2): via INTRAVENOUS
  Filled 2016-01-18 (×4): qty 1000

## 2016-01-18 MED ORDER — METOCLOPRAMIDE HCL 5 MG PO TABS
5.0000 mg | ORAL_TABLET | Freq: Three times a day (TID) | ORAL | Status: AC
  Administered 2016-01-18 – 2016-01-19 (×3): 5 mg via ORAL
  Filled 2016-01-18 (×3): qty 1

## 2016-01-18 MED ORDER — ONDANSETRON HCL 4 MG/2ML IJ SOLN
INTRAMUSCULAR | Status: AC
  Filled 2016-01-18: qty 2

## 2016-01-18 MED ORDER — ATORVASTATIN CALCIUM 20 MG PO TABS
20.0000 mg | ORAL_TABLET | Freq: Every day | ORAL | Status: DC
  Administered 2016-01-18 – 2016-01-20 (×3): 20 mg via ORAL
  Filled 2016-01-18 (×3): qty 1

## 2016-01-18 MED ORDER — SODIUM CHLORIDE 0.9% FLUSH
3.0000 mL | Freq: Two times a day (BID) | INTRAVENOUS | Status: DC
  Administered 2016-01-18 – 2016-01-20 (×5): 3 mL via INTRAVENOUS

## 2016-01-18 MED ORDER — TAMSULOSIN HCL 0.4 MG PO CAPS
0.4000 mg | ORAL_CAPSULE | Freq: Every day | ORAL | Status: DC
  Administered 2016-01-19 – 2016-01-20 (×2): 0.4 mg via ORAL
  Filled 2016-01-18 (×2): qty 1

## 2016-01-18 MED ORDER — LORATADINE 10 MG PO TABS
10.0000 mg | ORAL_TABLET | Freq: Every day | ORAL | Status: DC
  Administered 2016-01-19 – 2016-01-20 (×2): 10 mg via ORAL
  Filled 2016-01-18 (×2): qty 1

## 2016-01-18 MED ORDER — FLUTICASONE FUROATE-VILANTEROL 100-25 MCG/INH IN AEPB
1.0000 | INHALATION_SPRAY | Freq: Every day | RESPIRATORY_TRACT | Status: DC
  Administered 2016-01-19 – 2016-01-20 (×2): 1 via RESPIRATORY_TRACT
  Filled 2016-01-18: qty 28

## 2016-01-18 MED ORDER — HYDROCODONE-ACETAMINOPHEN 5-325 MG PO TABS: 1.0000 | ORAL_TABLET | ORAL | Status: DC | PRN

## 2016-01-18 MED ORDER — PANTOPRAZOLE SODIUM 40 MG PO TBEC
40.0000 mg | DELAYED_RELEASE_TABLET | Freq: Two times a day (BID) | ORAL | Status: DC
  Administered 2016-01-19 – 2016-01-21 (×4): 40 mg via ORAL
  Filled 2016-01-18 (×5): qty 1

## 2016-01-18 MED ORDER — ACETAMINOPHEN 325 MG PO TABS: 650.0000 mg | ORAL_TABLET | Freq: Four times a day (QID) | ORAL | Status: DC | PRN

## 2016-01-18 MED ORDER — ENOXAPARIN SODIUM 40 MG/0.4ML ~~LOC~~ SOLN: 40.0000 mg | SUBCUTANEOUS | Status: DC

## 2016-01-18 MED ORDER — ONDANSETRON HCL 4 MG/2ML IJ SOLN: 4.0000 mg | Freq: Four times a day (QID) | INTRAMUSCULAR | Status: DC | PRN

## 2016-01-18 MED ORDER — ATENOLOL 25 MG PO TABS
25.0000 mg | ORAL_TABLET | Freq: Two times a day (BID) | ORAL | Status: DC
  Filled 2016-01-18 (×2): qty 1

## 2016-01-18 MED ORDER — ASPIRIN EC 81 MG PO TBEC
81.0000 mg | DELAYED_RELEASE_TABLET | Freq: Every day | ORAL | Status: DC
  Administered 2016-01-19 – 2016-01-20 (×2): 81 mg via ORAL
  Filled 2016-01-18 (×2): qty 1

## 2016-01-18 MED ORDER — ESCITALOPRAM OXALATE 10 MG PO TABS
20.0000 mg | ORAL_TABLET | Freq: Every day | ORAL | Status: DC
  Administered 2016-01-19 – 2016-01-20 (×2): 20 mg via ORAL
  Filled 2016-01-18 (×2): qty 2

## 2016-01-18 MED ORDER — ONDANSETRON HCL 4 MG PO TABS: 4.0000 mg | ORAL_TABLET | Freq: Four times a day (QID) | ORAL | Status: DC | PRN

## 2016-01-18 MED ORDER — SODIUM CHLORIDE 0.9 % IV BOLUS (SEPSIS)
500.0000 mL | INTRAVENOUS | Status: AC
  Administered 2016-01-18: 500 mL via INTRAVENOUS

## 2016-01-18 MED ORDER — ASPIRIN EC 81 MG PO TBEC: 81.0000 mg | DELAYED_RELEASE_TABLET | Freq: Every day | ORAL | Status: DC

## 2016-01-18 MED ORDER — PREDNISONE 5 MG PO TABS
5.0000 mg | ORAL_TABLET | Freq: Every day | ORAL | Status: DC
  Administered 2016-01-19 – 2016-01-21 (×3): 5 mg via ORAL
  Filled 2016-01-18 (×3): qty 1

## 2016-01-18 MED ORDER — LEVOTHYROXINE SODIUM 75 MCG PO TABS
75.0000 ug | ORAL_TABLET | ORAL | Status: DC
  Administered 2016-01-19: 75 ug via ORAL
  Filled 2016-01-18: qty 1

## 2016-01-18 MED ORDER — LEVOTHYROXINE SODIUM 50 MCG PO TABS
50.0000 ug | ORAL_TABLET | ORAL | Status: DC
  Administered 2016-01-20 – 2016-01-21 (×2): 50 ug via ORAL
  Filled 2016-01-18 (×2): qty 1

## 2016-01-18 MED ORDER — FLUTICASONE PROPIONATE 50 MCG/ACT NA SUSP
2.0000 | Freq: Every day | NASAL | Status: DC | PRN
  Filled 2016-01-18: qty 16

## 2016-01-18 MED ORDER — POLYETHYLENE GLYCOL 3350 17 G PO PACK: 17.0000 g | PACK | Freq: Every day | ORAL | Status: DC | PRN

## 2016-01-18 MED ORDER — CLONAZEPAM 0.5 MG PO TABS
0.2500 mg | ORAL_TABLET | Freq: Two times a day (BID) | ORAL | Status: DC | PRN
  Administered 2016-01-18 – 2016-01-19 (×3): 0.25 mg via ORAL
  Filled 2016-01-18 (×3): qty 1

## 2016-01-18 MED ORDER — MONTELUKAST SODIUM 10 MG PO TABS
10.0000 mg | ORAL_TABLET | Freq: Every day | ORAL | Status: DC
  Administered 2016-01-18 – 2016-01-20 (×3): 10 mg via ORAL
  Filled 2016-01-18 (×3): qty 1

## 2016-01-18 MED ORDER — ONDANSETRON HCL 4 MG/2ML IJ SOLN
4.0000 mg | Freq: Once | INTRAMUSCULAR | Status: AC
  Administered 2016-01-18: 4 mg via INTRAVENOUS

## 2016-01-18 MED ORDER — MOMETASONE FURO-FORMOTEROL FUM 200-5 MCG/ACT IN AERO
2.0000 | INHALATION_SPRAY | Freq: Two times a day (BID) | RESPIRATORY_TRACT | Status: DC
  Administered 2016-01-18 – 2016-01-21 (×6): 2 via RESPIRATORY_TRACT
  Filled 2016-01-18: qty 8.8

## 2016-01-18 MED ORDER — POTASSIUM CHLORIDE CRYS ER 20 MEQ PO TBCR
40.0000 meq | EXTENDED_RELEASE_TABLET | Freq: Two times a day (BID) | ORAL | Status: DC
  Administered 2016-01-18: 40 meq via ORAL
  Filled 2016-01-18: qty 2

## 2016-01-18 MED ORDER — DOCUSATE SODIUM 100 MG PO CAPS
100.0000 mg | ORAL_CAPSULE | Freq: Two times a day (BID) | ORAL | Status: DC
  Administered 2016-01-18 – 2016-01-20 (×5): 100 mg via ORAL
  Filled 2016-01-18 (×5): qty 1

## 2016-01-18 MED ORDER — INSULIN ASPART 100 UNIT/ML ~~LOC~~ SOLN
0.0000 [IU] | Freq: Three times a day (TID) | SUBCUTANEOUS | Status: DC
  Administered 2016-01-19 (×2): 1 [IU] via SUBCUTANEOUS
  Administered 2016-01-20: 2 [IU] via SUBCUTANEOUS
  Filled 2016-01-18 (×2): qty 1
  Filled 2016-01-18: qty 2

## 2016-01-18 NOTE — H&P (Signed)
[facility] - [place] at [facility]   PATIENT NAME: Maureen Wilson    MR#:  [id number]  DATE OF BIRTH:  December 20, 1940  DATE OF ADMISSION:  01/18/2016  PRIMARY CARE PHYSICIAN: Joanna Hews, MD   REQUESTING/REFERRING PHYSICIAN: Dr. York Cerise  CHIEF COMPLAINT:   Chief Complaint  Patient presents with  . Weakness    HISTORY OF PRESENT ILLNESS:  Maureen Wilson  is a 75 y.o. female with a known history of paroxysmal atrial fibrillation, COPD, CHF, chronic respiratory failure presents to the emergency room with worsening weakness of one week. She has had nausea. Patient's daughter passed away 2 weeks back and patient has gotten worse since then with her appetite and weakness. She thinks she might be depressed. No suicidal thoughts. Patient continues to take her Lasix and metolazone. No vomiting or diarrhea. Patient has been found to have acute renal failure in the emergency room.  PAST MEDICAL HISTORY:   Past Medical History  Diagnosis Date  . COPD (chronic obstructive pulmonary disease) (HCC)   . Hypertension   . Atrial fibrillation (HCC)   . CHF (congestive heart failure) (HCC)   . Chronic kidney disease   . Chronic respiratory failure (HCC)     PAST SURGICAL HISTORY:   Past Surgical History  Procedure Laterality Date  . Partial hip arthroplasty Right   . Total hip arthroplasty Left   . Abdominal hysterectomy      SOCIAL HISTORY:   Social History  Substance Use Topics  . Smoking status: Never Smoker   . Smokeless tobacco: Never Used  . Alcohol Use: No    FAMILY HISTORY:   Family History  Problem Relation Age of Onset  . Hypertension Son     DRUG ALLERGIES:   Allergies  Allergen Reactions  . Amlodipine Swelling and Other (See Comments)    Reaction:  Lip/mouth swelling   . Hydralazine Swelling and Other (See Comments)    Reaction:  Lip/mouth swelling   . Iohexol      Code: HIVES, Desc: pt developed hives after receiving iv dye for  ct scan.  suggest she be premedicated for future exams, Onset Date: [id number]   . Metoprolol Swelling and Other (See Comments)    Reaction:  Lip/mouth swelling   . Olmesartan Swelling and Other (See Comments)    Reaction:  Lip/mouth swelling   . Ramipril Swelling and Other (See Comments)    Reaction:  Lip/mouth swelling   . Sulfa Antibiotics Other (See Comments)    angioedema  . Telmisartan Swelling and Other (See Comments)    Reaction:  Lip/mouth swelling   . Tiotropium Swelling    REVIEW OF SYSTEMS:   Review of Systems  Constitutional: Positive for malaise/fatigue. Negative for fever, chills and weight loss.  HENT: Negative for hearing loss and nosebleeds.   Eyes: Negative for blurred vision, double vision and pain.  Respiratory: Negative for cough, hemoptysis, sputum production, shortness of breath and wheezing.   Cardiovascular: Negative for chest pain, palpitations, orthopnea and leg swelling.  Gastrointestinal: Negative for nausea, vomiting, abdominal pain, diarrhea and constipation.  Genitourinary: Negative for dysuria and hematuria.  Musculoskeletal: Negative for myalgias, back pain and falls.  Skin: Negative for rash.  Neurological: Positive for weakness. Negative for dizziness, tremors, sensory change, speech change, focal weakness, seizures and headaches.  Endo/Heme/Allergies: Does not bruise/bleed easily.  Psychiatric/Behavioral: Positive for depression. Negative for memory loss. The patient is not nervous/anxious.     MEDICATIONS AT HOME:   Prior to Admission  medications   Medication Sig Start Date End Date Taking? Authorizing Provider  atenolol (TENORMIN) 25 MG tablet Take 25 mg by mouth 2 (two) times daily.   Yes Historical Provider, MD  atorvastatin (LIPITOR) 20 MG tablet Take 20 mg by mouth at bedtime.    Yes Historical Provider, MD  budesonide-formoterol (SYMBICORT) 160-4.5 MCG/ACT inhaler Inhale 2 puffs into the lungs 2 (two) times daily.   Yes Historical  Provider, MD  cetirizine (ZYRTEC) 10 MG tablet Take 10 mg by mouth daily.    Yes Historical Provider, MD  cholecalciferol (VITAMIN D) 1000 units tablet Take 2,000 Units by mouth daily.   Yes Historical Provider, MD  clonazePAM (KLONOPIN) 1 MG tablet Take 0.5-1 mg by mouth 3 (three) times daily. Pt takes one-half tablet in the morning, one-half tablet in the afternoon, and one tablet at bedtime.   Yes Historical Provider, MD  diltiazem (DILACOR XR) 180 MG 24 hr capsule Take 180 mg by mouth daily.   Yes Historical Provider, MD  escitalopram (LEXAPRO) 20 MG tablet Take 20 mg by mouth daily.   Yes Historical Provider, MD  esomeprazole (NEXIUM) 20 MG capsule Take 20 mg by mouth 2 (two) times daily before a meal.   Yes Historical Provider, MD  furosemide (LASIX) 40 MG tablet Take 40 mg by mouth 2 (two) times daily.   Yes Historical Provider, MD  levothyroxine (SYNTHROID, LEVOTHROID) 50 MCG tablet Take 50 mcg by mouth daily before breakfast. Pt takes this dose on Sunday, Tuesday, Wednesday, Friday, and Saturday.   Yes Historical Provider, MD  levothyroxine (SYNTHROID, LEVOTHROID) 75 MCG tablet Take 75 mcg by mouth daily before breakfast. Pt takes this dose on Monday and Thursday.   Yes Historical Provider, MD  metolazone (ZAROXOLYN) 2.5 MG tablet Take 2.5 mg by mouth 2 (two) times a week. Pt takes on Monday and Thursday.   Yes Historical Provider, MD  montelukast (SINGULAIR) 10 MG tablet Take 10 mg by mouth at bedtime.   Yes Historical Provider, MD  potassium chloride SA (K-DUR,KLOR-CON) 20 MEQ tablet Take 20-40 mEq by mouth 2 (two) times daily. Pt takes two tablets in the morning and one tablet in the evening.   Yes Historical Provider, MD  predniSONE (DELTASONE) 5 MG tablet Take 5 mg by mouth daily with breakfast.   Yes Historical Provider, MD  tamsulosin (FLOMAX) 0.4 MG CAPS capsule Take 0.4 mg by mouth daily.    Yes Historical Provider, MD  warfarin (COUMADIN) 1 MG tablet Take 1 mg by mouth every other  day.   Yes Historical Provider, MD  warfarin (COUMADIN) 2 MG tablet Take 2 mg by mouth every other day.   Yes Historical Provider, MD  albuterol (PROVENTIL) (2.5 MG/3ML) 0.083% nebulizer solution Inhale 3 mLs into the lungs every 6 (six) hours as needed. 09/14/15 09/13/16  Historical Provider, MD  albuterol-ipratropium (COMBIVENT) 18-103 MCG/ACT inhaler Inhale 1-2 puffs into the lungs every 4 (four) hours.    Historical Provider, MD  amoxicillin-clavulanate (AUGMENTIN) 875-125 MG tablet Take 1 tablet by mouth 2 (two) times daily. Patient not taking: Reported on 01/18/2016 10/05/15   Katha HammingSnehalatha Konidena, MD  aspirin EC 81 MG tablet Take 81 mg by mouth daily.    Historical Provider, MD  docusate sodium (COLACE) 100 MG capsule Take 1 capsule (100 mg total) by mouth 2 (two) times daily. 10/05/15   Katha HammingSnehalatha Konidena, MD  fluticasone (FLONASE) 50 MCG/ACT nasal spray Place 2 sprays into the nose daily.     Historical Provider, MD  mupirocin ointment (BACTROBAN) 2 % Place 1 application into the nose 2 (two) times daily. 10/05/15   Katha Hamming, MD  senna-docusate (SENOKOT-S) 8.6-50 MG tablet Take 1 tablet by mouth at bedtime as needed for mild constipation. 10/05/15   Katha Hamming, MD     VITAL SIGNS:  Blood pressure 100/50, pulse 82, temperature 98.2 F (36.8 C), temperature source Oral, resp. rate 18, height 5\' 3"  (1.6 m), weight 99.791 kg (220 lb), SpO2 100 %.  PHYSICAL EXAMINATION:  Physical Exam  GENERAL:  75 y.o.-year-old patient lying in the bed with no acute distress. Morbidly obese EYES: Pupils equal, round, reactive to light and accommodation. No scleral icterus. Extraocular muscles intact.  HEENT: Head atraumatic, normocephalic. Oropharynx and nasopharynx clear. No oropharyngeal erythema, moist oral mucosa. NECK:  Supple, no jugular venous distention. No thyroid enlargement, no tenderness.  LUNGS: Normal breath sounds bilaterally, no wheezing, rales, rhonchi. No use of  accessory muscles of respiration.  CARDIOVASCULAR: S1, S2 normal. No murmurs, rubs, or gallops.  ABDOMEN: Soft, nontender, nondistended. Bowel sounds present. No organomegaly or mass.  EXTREMITIES: No pedal edema, cyanosis, or clubbing. + 2 pedal & radial pulses b/l.   NEUROLOGIC: Cranial nerves II through XII are intact. No focal Motor or sensory deficits appreciated b/l PSYCHIATRIC: The patient is alert and oriented x 3. Good affect. SKIN: No obvious rash, lesion, or ulcer.   LABORATORY PANEL:   CBC  Recent Labs Lab 01/18/16 1346  WBC 7.9  HGB 10.5*  HCT 32.0*  PLT 233   ------------------------------------------------------------------------------------------------------------------  Chemistries   Recent Labs Lab 01/18/16 1346  NA 129*  K 3.3*  CL 84*  CO2 33*  GLUCOSE 146*  BUN 33*  CREATININE 2.73*  CALCIUM 9.3  MG 2.1   ------------------------------------------------------------------------------------------------------------------  Cardiac Enzymes  Recent Labs Lab 01/18/16 1346  TROPONINI 0.03   ------------------------------------------------------------------------------------------------------------------  RADIOLOGY:  Dg Chest 2 View  01/18/2016  CLINICAL DATA:  Weakness, nausea for 3 days, shortness of breath with exertion, coronary artery disease post stenting 3 years ago, hypertension, CHF, COPD, atrial fibrillation EXAM: CHEST  2 VIEW COMPARISON:  10/03/2015 FINDINGS: Enlargement of cardiac silhouette. Atherosclerotic calcification aorta. Mediastinal contours and pulmonary vascularity normal. Chronic eventration RIGHT diaphragm. Minimal residual atelectasis LEFT mid lung. Lungs otherwise clear. No infiltrate, pleural effusion or pneumothorax. Improved lung volumes versus previous study. Bones demineralized. IMPRESSION: Enlargement of cardiac silhouette. Improved aeration since previous exam without acute abnormalities. Electronically Signed   By: Ulyses Southward M.D.   On: 01/18/2016 14:56     IMPRESSION AND PLAN:   * ARF over CKD3 Dehydration Likely due to poor by mouth intake and being on Lasix and metolazone. Hold diuretic. Start IV fluids. Monitor input and output with daily weight. Repeat labs in the morning.  * Hypovolemic hyponatremia Should improve with IV fluids.  * Weakness due to acute renal failure and dehydration. Consult physical therapy.  * Grief/depression due to recent daughter's death. Will start Remeron at night.  * Hypertension Hold medications as patient blood pressure is low normal. Restart once blood pressure improves with fluids.  * Paroxysmal atrial fibrillation Patient is on Coumadin and rate control medications.  * Chronic diastolic CHF. Patient is dehydrated. Diuretics held. Started IV fluids. Monitor for fluid overload.  * COPD with chronic respiratory failure is stable. Continue oxygen at 2 L/m.  * DVT prophylaxis. Patient is on Coumadin.  All the records are reviewed and case discussed with ED provider. Management plans discussed with the patient, family  and they are in agreement.  CODE STATUS: FULL CODE  TOTAL TIME TAKING CARE OF THIS PATIENT: 35 minutes.   Milagros Loll R M.D on 01/18/2016 at 3:50 PM  Between 7am to 6pm - Pager - [phone]  After 6pm go to [email or web address] - password [email or web address] [facility]  [facility]  Office  [phone]  CC: Primary care physician; Joanna Hews, MD  Note: This dictation was prepared with Dragon dictation along with smaller phrase technology. Any transcriptional errors that result from this process are unintentional.

## 2016-01-18 NOTE — ED Provider Notes (Signed)
[facility] Emergency Department Provider Note  ____________________________________________  Time seen: Approximately 2:00 PM  I have reviewed the triage vital signs and the nursing notes.   HISTORY  Chief Complaint Weakness    HPI Maureen Wilson is a 75 y.o. female with multiple chronic medical issues including COPD on 2 L of oxygen at baseline, A. fib on warfarin, CHF, mild chronic kidney disease, hypertension, and obesity who presents by EMS for evaluation of gradual onset of 3 days of nausea and generalized weakness.  She reports that the symptoms have been gradually getting worse over the last 3 days.  Nothing in particular makes her symptoms better or worse.  She states that she has been able to eat and drink normally and has not been vomiting but that she is persistently nauseated.  She denies abdominal pain.  She also denies fever/chills, chest pain, shortness of breath, dysuria.  She states that as her weakness was getting worse, she is starting to feel weak on her feet and she had a mechanical fall last night and bumped the anterior left thigh against an end table.  She has a small bruise indicating the location of the contusion.  She was going to her primary care doctor later this afternoon but as her symptoms were getting worse today, and she currently describes them as severe, she called the office and was told to call 911 to be evaluated in the emergency department.    Past Medical History  Diagnosis Date  . COPD (chronic obstructive pulmonary disease) (HCC)   . Hypertension   . Atrial fibrillation (HCC)   . CHF (congestive heart failure) (HCC)   . Chronic kidney disease     Maureen Wilson Active Problem List   Diagnosis Date Noted  . Aspiration pneumonia (HCC) 10/01/2015  . Fall 09/29/2015  . Gait instability 09/29/2015    Past Surgical History  Procedure Laterality Date  . Partial hip arthroplasty Right   . Total hip arthroplasty Left    . Abdominal hysterectomy      Current Outpatient Rx  Name  Route  Sig  Dispense  Refill  . albuterol (PROVENTIL) (2.5 MG/3ML) 0.083% nebulizer solution   Inhalation   Inhale 3 mLs into the lungs every 6 (six) hours as needed.         Marland Kitchen. albuterol-ipratropium (COMBIVENT) 18-103 MCG/ACT inhaler   Inhalation   Inhale 1-2 puffs into the lungs every 4 (four) hours.         . ALPRAZolam (XANAX) 0.25 MG tablet   Oral   Take 0.25 mg by mouth 3 (three) times daily as needed.          Marland Kitchen. amoxicillin-clavulanate (AUGMENTIN) 875-125 MG tablet   Oral   Take 1 tablet by mouth 2 (two) times daily.   20 tablet   0   . aspirin EC 81 MG tablet   Oral   Take 81 mg by mouth daily.         Marland Kitchen. atenolol (TENORMIN) 50 MG tablet   Oral   Take 0.5 tablets by mouth 2 (two) times daily.         Marland Kitchen. atorvastatin (LIPITOR) 20 MG tablet   Oral   Take 1 tablet by mouth daily.         . benzonatate (TESSALON) 100 MG capsule   Oral   Take 1 capsule by mouth 2 (two) times daily as needed.         . budesonide-formoterol (SYMBICORT) 160-4.5  MCG/ACT inhaler   Inhalation   Inhale 2 puffs into the lungs 2 (two) times daily.         . cetirizine (ZYRTEC) 10 MG tablet   Oral   Take 1 tablet by mouth daily.         . Cholecalciferol (D 2000) 2000 UNITS TABS   Oral   Take 2,000 Units by mouth daily.         . clonazePAM (KLONOPIN) 0.5 MG tablet   Oral   Take 0.5-1 tablets by mouth 3 (three) times daily. Half a tablet in the Am and at [facility], and the 1 tablet at bedtime         . diltiazem (CARDIZEM CD) 180 MG 24 hr capsule   Oral   Take 180 mg by mouth daily.          Marland Kitchen docusate sodium (COLACE) 100 MG capsule   Oral   Take 1 capsule (100 mg total) by mouth 2 (two) times daily.   10 capsule   0   . escitalopram (LEXAPRO) 10 MG tablet   Oral   Take 10 mg by mouth daily.          Marland Kitchen esomeprazole (NEXIUM) 40 MG capsule   Oral   Take by mouth 2 (two) times daily.          . fluticasone (FLONASE) 50 MCG/ACT nasal spray   Nasal   Place 2 sprays into the nose daily.          . Fluticasone-Salmeterol (ADVAIR) 250-50 MCG/DOSE AEPB   Inhalation   Inhale 1 puff into the lungs 2 (two) times daily.         . furosemide (LASIX) 40 MG tablet   Oral   Take 1 tablet (40 mg total) by mouth daily.   30 tablet   0   . levothyroxine (SYNTHROID, LEVOTHROID) 50 MCG tablet   Oral   Take by mouth. Take 1 tablet daily except on Monday and Thursday, take 1.5 tablets         . metolazone (ZAROXOLYN) 2.5 MG tablet   Oral   Take 2.5 mg by mouth 2 (two) times a week. On Monday and Thursday         . montelukast (SINGULAIR) 10 MG tablet      TAKE ONE TABLET AT BEDTIME.         . mupirocin ointment (BACTROBAN) 2 %   Nasal   Place 1 application into the nose 2 (two) times daily.   22 g   0   . nystatin (MYCOSTATIN) 100000 UNIT/ML suspension   Oral   Take 5 mLs by mouth 4 (four) times daily.          . ondansetron (ZOFRAN) 4 MG tablet   Oral   Take 4 mg by mouth every 8 (eight) hours as needed.          . potassium chloride SA (K-DUR,KLOR-CON) 20 MEQ tablet   Oral   Take 20 mEq by mouth daily.          . predniSONE (DELTASONE) 5 MG tablet   Oral   Take 5 mg by mouth daily with breakfast.         . risperiDONE (RISPERDAL) 0.5 MG tablet   Oral   Take 0.5 mg by mouth daily.          Marland Kitchen senna-docusate (SENOKOT-S) 8.6-50 MG tablet   Oral   Take 1 tablet by mouth at  bedtime as needed for mild constipation.   30 tablet   0   . tamsulosin (FLOMAX) 0.4 MG CAPS capsule   Oral   Take 0.4 mg by mouth daily.          . traMADol (ULTRAM) 50 MG tablet   Oral   Take 50 mg by mouth every 6 (six) hours as needed.          . warfarin (COUMADIN) 2 MG tablet      TAKE ONE (1) TABLET BY MOUTH ONCE DAILY           Allergies Amlodipine; Hydralazine; Iohexol; Metoprolol; Olmesartan; Ramipril; Sulfa antibiotics; Telmisartan; and  Tiotropium  Family History  Problem Relation Age of Onset  . Hypertension Son     Social History Social History  Substance Use Topics  . Smoking status: Never Smoker   . Smokeless tobacco: Never Used  . Alcohol Use: No    Review of Systems Constitutional: No fever/chills Eyes: No visual changes. ENT: No sore throat. Cardiovascular: Denies chest pain. Respiratory: Denies shortness of breath. Gastrointestinal: No abdominal pain.  Nausea, no vomiting.  No diarrhea.  No constipation. Genitourinary: Negative for dysuria. Musculoskeletal: Negative for back pain. Skin: Negative for rash. Neurological: Negative for headaches, focal weakness or numbness.  10-point ROS otherwise negative.  ____________________________________________   PHYSICAL EXAM:  VITAL SIGNS: ED Triage Vitals  Enc Vitals Group     BP 01/18/16 1329 101/56 mmHg     Pulse Rate 01/18/16 1329 74     Resp 01/18/16 1329 10     Temp 01/18/16 1329 98.2 F (36.8 C)     Temp Source 01/18/16 1329 Oral     SpO2 01/18/16 1329 98 %     Weight 01/18/16 1325 220 lb (99.791 kg)     Height 01/18/16 1325  (1.6 m)     Head Cir --      Peak Flow --      Pain Score --      Pain Loc --      Pain Edu? --      Excl. in GC? --     Constitutional: Alert and oriented. No acute distress. Eyes: Conjunctivae are normal. PERRL. EOMI. Head: Atraumatic. Nose: No congestion/rhinnorhea. Mouth/Throat: Mucous membranes are moist.  Oropharynx non-erythematous. Neck: No stridor.  No meningeal signs.   Cardiovascular: Normal rate, regular rhythm. Good peripheral circulation. Grossly normal heart sounds.   Respiratory: Normal respiratory effort.  No retractions. Lungs CTAB. Gastrointestinal: Obese. Soft and nontender. No distention.  Musculoskeletal: No lower extremity tenderness nor edema. No gross deformities of extremities. Neurologic:  Normal speech and language. No gross focal neurologic deficits are appreciated.  Skin:   Skin is warm, dry and intact. No rash noted.  ____________________________________________   LABS (all labs ordered are listed, but only abnormal results are displayed)  Labs Reviewed  BASIC METABOLIC PANEL - Abnormal; Notable for the following:    Sodium 129 (*)    Potassium 3.3 (*)    Chloride 84 (*)    CO2 33 (*)    Glucose, Bld 146 (*)    BUN 33 (*)    Creatinine, Ser 2.73 (*)    GFR calc non Af Amer 16 (*)    GFR calc Af Amer 19 (*)    All other components within normal limits  CBC - Abnormal; Notable for the following:    RBC 3.70 (*)    Hemoglobin 10.5 (*)    HCT 32.0 (*)  RDW 16.2 (*)    All other components within normal limits  URINALYSIS COMPLETEWITH MICROSCOPIC ([facility] ONLY) - Abnormal; Notable for the following:    Color, Urine YELLOW (*)    APPearance CLEAR (*)    Bacteria, UA RARE (*)    Squamous Epithelial / LPF 0-5 (*)    All other components within normal limits  PROTIME-INR - Abnormal; Notable for the following:    Prothrombin Time 38.2 (*)    INR 4.06 (*)    All other components within normal limits  APTT - Abnormal; Notable for the following:    aPTT 41 (*)    All other components within normal limits  URINE CULTURE  TROPONIN I  MAGNESIUM  LACTIC ACID, PLASMA   ____________________________________________  EKG  ED ECG REPORT I, Demarlo Riojas, the attending physician, personally viewed and interpreted this ECG.  Date: 01/18/2016 EKG Time: 13:27 Rate:  82 Rhythm: Atrial fibrillation QRS Axis: normal Intervals: normal ST/T Wave abnormalities: normal Conduction Disturbances: none Narrative Interpretation: unremarkable.  Maureen Wilson has a history of chronic atrial fibrillation.  ____________________________________________  RADIOLOGY   Dg Chest 2 View  01/18/2016  CLINICAL DATA:  Weakness, nausea for 3 days, shortness of breath with exertion, coronary artery disease post stenting 3 years ago, hypertension, CHF, COPD, atrial fibrillation  EXAM: CHEST  2 VIEW COMPARISON:  10/03/2015 FINDINGS: Enlargement of cardiac silhouette. Atherosclerotic calcification aorta. Mediastinal contours and pulmonary vascularity normal. Chronic eventration RIGHT diaphragm. Minimal residual atelectasis LEFT mid lung. Lungs otherwise clear. No infiltrate, pleural effusion or pneumothorax. Improved lung volumes versus previous study. Bones demineralized. IMPRESSION: Enlargement of cardiac silhouette. Improved aeration since previous exam without acute abnormalities. Electronically Signed   By: Ulyses Southward M.D.   On: 01/18/2016 14:56    ____________________________________________   PROCEDURES  Procedure(s) performed: None  Critical Care performed: No ____________________________________________   INITIAL IMPRESSION / ASSESSMENT AND PLAN / ED COURSE  Pertinent labs & imaging results that were available during my care of the Maureen Wilson were reviewed by me and considered in my medical decision making (see chart for details).  Nonspecific chief complaint of generalized weakness, initiated broad evaluation for infectious versus metabolic cause  Maureen Wilson's labs generally unremarkable except that her baseline creatinine of 1.2 is elevated to 2.73 and is indicative of acute renal failure.  I am starting a relatively small IV fluid bolus of 500 mL normal saline.  The Maureen Wilson states that she is followed by Dr. Thedore Mins (nephrology).  I have discussed the case with Dr. Elpidio Anis (hospitalist) who will admit.  I updated the Maureen Wilson and her son.  ____________________________________________  FINAL CLINICAL IMPRESSION(S) / ED DIAGNOSES  Final diagnoses:  Acute renal failure, unspecified acute renal failure type (HCC)  Generalized weakness  History of COPD  Nausea without vomiting      NEW MEDICATIONS STARTED DURING THIS VISIT:  New Prescriptions   No medications on file      Note:  This document was prepared using Dragon voice recognition software and may  include unintentional dictation errors.   Loleta Rose, MD 01/18/16 1530

## 2016-01-18 NOTE — ED Notes (Signed)
Pt to ED via EMS from home c/o generalized weakness x3 days.  Pt c/o nausea, denies vomiting or diarrhea.  States eating and taking medications like normal at home.  Hx of CHF, a.fib, and COPD.  EMS vitals were BP 120/83, 98% 2L [place] (chronic use at home), 98.7, CBG 193.  Pt states fell yesterday at home with bruising to left leg.  Pt A&Ox4, speaking in complete and coherent sentences, and in NAD at this time.

## 2016-01-19 DIAGNOSIS — F4321 Adjustment disorder with depressed mood: Secondary | ICD-10-CM

## 2016-01-19 DIAGNOSIS — F331 Major depressive disorder, recurrent, moderate: Secondary | ICD-10-CM

## 2016-01-19 LAB — CBC
HCT: 29.3 % — ABNORMAL LOW (ref 35.0–47.0)
Hemoglobin: 9.8 g/dL — ABNORMAL LOW (ref 12.0–16.0)
MCH: 28.4 pg (ref 26.0–34.0)
MCHC: 33.4 g/dL (ref 32.0–36.0)
MCV: 85.1 fL (ref 80.0–100.0)
Platelets: 208 10*3/uL (ref 150–440)
RBC: 3.44 MIL/uL — AB (ref 3.80–5.20)
RDW: 16.5 % — ABNORMAL HIGH (ref 11.5–14.5)
WBC: 6.7 10*3/uL (ref 3.6–11.0)

## 2016-01-19 LAB — MRSA PCR SCREENING: MRSA by PCR: POSITIVE — AB

## 2016-01-19 LAB — GLUCOSE, CAPILLARY
GLUCOSE-CAPILLARY: 138 mg/dL — AB (ref 65–99)
GLUCOSE-CAPILLARY: 148 mg/dL — AB (ref 65–99)
Glucose-Capillary: 98 mg/dL (ref 65–99)
Glucose-Capillary: 140 mg/dL — ABNORMAL HIGH (ref 65–99)

## 2016-01-19 LAB — BASIC METABOLIC PANEL
ANION GAP: 10 (ref 5–15)
BUN: 29 mg/dL — ABNORMAL HIGH (ref 6–20)
CO2: 31 mmol/L (ref 22–32)
Calcium: 8.7 mg/dL — ABNORMAL LOW (ref 8.9–10.3)
Chloride: 93 mmol/L — ABNORMAL LOW (ref 101–111)
Creatinine, Ser: 2.27 mg/dL — ABNORMAL HIGH (ref 0.44–1.00)
GFR calc Af Amer: 23 mL/min — ABNORMAL LOW (ref >60)
GFR, EST NON AFRICAN AMERICAN: 20 mL/min — AB (ref >60)
GLUCOSE: 98 mg/dL (ref 65–99)
POTASSIUM: 2.6 mmol/L — AB (ref 3.5–5.1)
Sodium: 134 mmol/L — ABNORMAL LOW (ref 135–145)

## 2016-01-19 LAB — PROTIME-INR
INR: 4.41 — AB
PROTHROMBIN TIME: 40.9 s — AB (ref 11.4–15.0)

## 2016-01-19 LAB — POTASSIUM: POTASSIUM: 3.7 mmol/L (ref 3.5–5.1)

## 2016-01-19 LAB — MAGNESIUM: Magnesium: 2 mg/dL (ref 1.7–2.4)

## 2016-01-19 LAB — TSH: TSH: 1.209 u[IU]/mL (ref 0.350–4.500)

## 2016-01-19 MED ORDER — POTASSIUM CHLORIDE 10 MEQ/100ML IV SOLN
10.0000 meq | INTRAVENOUS | Status: AC
  Administered 2016-01-19 (×4): 10 meq via INTRAVENOUS
  Filled 2016-01-19 (×4): qty 100

## 2016-01-19 MED ORDER — MUPIROCIN 2 % EX OINT
1.0000 "application " | TOPICAL_OINTMENT | Freq: Two times a day (BID) | CUTANEOUS | Status: DC
  Administered 2016-01-19 – 2016-01-20 (×4): 1 via NASAL
  Filled 2016-01-19: qty 22

## 2016-01-19 MED ORDER — CHLORHEXIDINE GLUCONATE CLOTH 2 % EX PADS: 6.0000 | MEDICATED_PAD | Freq: Every day | CUTANEOUS | Status: DC

## 2016-01-19 MED ORDER — POTASSIUM CHLORIDE CRYS ER 20 MEQ PO TBCR
40.0000 meq | EXTENDED_RELEASE_TABLET | Freq: Once | ORAL | Status: AC
  Administered 2016-01-19: 40 meq via ORAL
  Filled 2016-01-19: qty 2

## 2016-01-19 NOTE — Progress Notes (Signed)
Patient asked for information on an Advance Directive. Patient and Son covered on the Advance Directive and they will consider and if decided on will contact Past[facility].

## 2016-01-19 NOTE — Progress Notes (Signed)
[facility] - [place] at [facility]   PATIENT NAME: Calvin Chura    MR#:  [id number]  DATE OF BIRTH:  1940/10/09  SUBJECTIVE:   Patient doing fairly well this morning. No acute issues overnight. Patient states she is urinating well and had to get up 3 times at night.  REVIEW OF SYSTEMS:    Review of Systems  Constitutional: Negative for fever, chills and malaise/fatigue.  HENT: Negative for ear discharge, ear pain, hearing loss, nosebleeds and sore throat.   Eyes: Negative for blurred vision and pain.  Respiratory: Negative for cough, hemoptysis, shortness of breath and wheezing.   Cardiovascular: Negative for chest pain, palpitations and leg swelling.  Gastrointestinal: Negative for nausea, vomiting, abdominal pain, diarrhea and blood in stool.  Genitourinary: Negative for dysuria.  Musculoskeletal: Negative for back pain.  Neurological: Positive for weakness. Negative for dizziness, tremors, speech change, focal weakness, seizures and headaches.  Endo/Heme/Allergies: Does not bruise/bleed easily.  Psychiatric/Behavioral: Negative for depression, suicidal ideas and hallucinations.    Tolerating Diet:yes      DRUG ALLERGIES:   Allergies  Allergen Reactions  . Amlodipine Swelling and Other (See Comments)    Reaction:  Lip/mouth swelling   . Hydralazine Swelling and Other (See Comments)    Reaction:  Lip/mouth swelling   . Iohexol Hives and Other (See Comments)    Desc: Pt developed hives after receiving iv dye for ct scan.  suggest she be premedicated for future exams, Onset Date: [id number]   . Metoprolol Swelling and Other (See Comments)    Reaction:  Lip/mouth swelling   . Olmesartan Swelling and Other (See Comments)    Reaction:  Lip/mouth swelling   . Ramipril Swelling and Other (See Comments)    Reaction:  Lip/mouth swelling   . Sulfa Antibiotics Swelling and Other (See Comments)    Reaction:  Lip/mouth swelling   . Telmisartan  Swelling and Other (See Comments)    Reaction:  Lip/mouth swelling   . Tiotropium Swelling and Other (See Comments)    Reaction:  Lip/mouth swelling     VITALS:  Blood pressure 92/58, pulse 85, temperature 97.4 F (36.3 C), temperature source Oral, resp. rate 20, height  (1.6 m), weight 102.558 kg (226 lb 1.6 oz), SpO2 98 %.  PHYSICAL EXAMINATION:   Physical Exam  Constitutional: She is oriented to person, place, and time and well-developed, well-nourished, and in no distress. No distress.  HENT:  Head: Normocephalic.  Eyes: No scleral icterus.  Neck: Normal range of motion. Neck supple. No JVD present. No tracheal deviation present.  Cardiovascular: Normal rate, regular rhythm and normal heart sounds.  Exam reveals no gallop and no friction rub.   No murmur heard. Pulmonary/Chest: Effort normal and breath sounds normal. No respiratory distress. She has no wheezes. She has no rales. She exhibits no tenderness.  Abdominal: Soft. Bowel sounds are normal. She exhibits no distension and no mass. There is no tenderness. There is no rebound and no guarding.  Musculoskeletal: Normal range of motion. She exhibits no edema.  Neurological: She is alert and oriented to person, place, and time.  Skin: Skin is warm. No rash noted. No erythema.  Psychiatric: Affect and judgment normal.      LABORATORY PANEL:   CBC  Recent Labs Lab 01/19/16 0601  WBC 6.7  HGB 9.8*  HCT 29.3*  PLT 208   ------------------------------------------------------------------------------------------------------------------  Chemistries   Recent Labs Lab 01/19/16 0601  NA 134*  K 2.6*  CL  93*  CO2 31  GLUCOSE 98  BUN 29*  CREATININE 2.27*  CALCIUM 8.7*  MG 2.0   ------------------------------------------------------------------------------------------------------------------  Cardiac Enzymes  Recent Labs Lab 01/18/16 1346  TROPONINI 0.03    ------------------------------------------------------------------------------------------------------------------  RADIOLOGY:  Dg Chest 2 View  01/18/2016  CLINICAL DATA:  Weakness, nausea for 3 days, shortness of breath with exertion, coronary artery disease post stenting 3 years ago, hypertension, CHF, COPD, atrial fibrillation EXAM: CHEST  2 VIEW COMPARISON:  10/03/2015 FINDINGS: Enlargement of cardiac silhouette. Atherosclerotic calcification aorta. Mediastinal contours and pulmonary vascularity normal. Chronic eventration RIGHT diaphragm. Minimal residual atelectasis LEFT mid lung. Lungs otherwise clear. No infiltrate, pleural effusion or pneumothorax. Improved lung volumes versus previous study. Bones demineralized. IMPRESSION: Enlargement of cardiac silhouette. Improved aeration since previous exam without acute abnormalities. Electronically Signed   By: Ulyses SouthwardMark  Boles M.D.   On: 01/18/2016 14:56     ASSESSMENT AND PLAN:   75 year old female with PAF, COPD and chronic respiratory failure who presented with weakness and found to have acute renal failure.  1. Acute renal failure: This is due to use of Lasix and metolazone with poor by mouth intake. Pending improving with IV fluids. Continue to hold nephrotoxic agents. Repeat BMP in a.m.  2. Hypovolemic hyponatremia: Sodium level has improved with IV fluids.  3. Generalized weakness with hypotension: This is due to acute renal failure  Physical therapy consultation for disposition. Continue IV fluids  4.. Hypokalemia: Magnesium level is normal. This is likely due to diuretic use. Please and recheck in a.m.  5. PAF: INR is 4.41. Continue to hold Coumadin BP low so atenolol and diltiazem held this morning. Continue to monitor heart rate If patient becomes tachycardic and will need to digoxin or amiodarone if blood pressure is still low.   6. Depression with recent loss: Continue Lexapro Consult psych.  7. Hyperlipidemia:  Continue Lipitor  8. Hypothyroidism: Continue Synthroid Check TSH    Management plans discussed with the patient and she is in agreement.  CODE STATUS: FULL  TOTAL TIME TAKING CARE OF THIS PATIENT: 30 minutes.     POSSIBLE D/C 1-2 days, DEPENDING ON CLINICAL CONDITION.   Tameko Halder M.D on 01/19/2016 at 10:54 AM  Between 7am to 6pm - Pager - [phone] After 6pm go to [email or web address] - password [email or web address] [facility]  [facility]  Office  [phone]  CC: Primary care physician; Joanna HewsATE,ALLEN D, MD  Note: This dictation was prepared with Dragon dictation along with smaller phrase technology. Any transcriptional errors that result from this process are unintentional.

## 2016-01-19 NOTE — Progress Notes (Signed)
Electrolyte Supplementation NOTE - INITIAL   Pharmacy Consult for Hypokalemia   Allergies  Allergen Reactions  . Amlodipine Swelling and Other (See Comments)    Reaction:  Lip/mouth swelling   . Hydralazine Swelling and Other (See Comments)    Reaction:  Lip/mouth swelling   . Iohexol Hives and Other (See Comments)    Desc: Pt developed hives after receiving iv dye for ct scan.  suggest she be premedicated for future exams, Onset Date: [id number]   . Metoprolol Swelling and Other (See Comments)    Reaction:  Lip/mouth swelling   . Olmesartan Swelling and Other (See Comments)    Reaction:  Lip/mouth swelling   . Ramipril Swelling and Other (See Comments)    Reaction:  Lip/mouth swelling   . Sulfa Antibiotics Swelling and Other (See Comments)    Reaction:  Lip/mouth swelling   . Telmisartan Swelling and Other (See Comments)    Reaction:  Lip/mouth swelling   . Tiotropium Swelling and Other (See Comments)    Reaction:  Lip/mouth swelling     Patient Measurements: Height:  (160 cm) Weight: 226 lb 1.6 oz (102.558 kg) IBW/kg (Calculated) : 52.4   Vital Signs: Temp: 98.3 F (36.8 C) (04/13 1313) Temp Source: Oral (04/13 0451) BP: 106/52 mmHg (04/13 1313) Pulse Rate: 93 (04/13 1313) Intake/Output from previous day: 04/12 0701 - 04/13 0700 In: 240 [P.O.:240] Out: 1400 [Urine:1400] Intake/Output from this shift: Total I/O In: 1995.4 [P.O.:420; I.V.:1308.8; IV Piggyback:266.5] Out: 250 [Urine:250]  Labs:  Recent Labs  01/18/16 1346 01/19/16 0601  WBC 7.9 6.7  HGB 10.5* 9.8*  HCT 32.0* 29.3*  PLT 233 208  APTT 41*  --   CREATININE 2.73* 2.27*  MG 2.1 2.0   Estimated Creatinine Clearance: 24.9 mL/min (by C-G formula based on Cr of 2.27).   Medical History: Past Medical History  Diagnosis Date  . COPD (chronic obstructive pulmonary disease) (HCC)   . Hypertension   . Atrial fibrillation (HCC)   . CHF (congestive heart failure) (HCC)   . Chronic kidney  disease   . Chronic respiratory failure (HCC)     Medications:  Scheduled:  . aspirin EC  81 mg Oral Daily  . atorvastatin  20 mg Oral QHS  . Chlorhexidine Gluconate Cloth  6 each Topical Q0600  . docusate sodium  100 mg Oral BID  . escitalopram  20 mg Oral Daily  . fluticasone furoate-vilanterol  1 puff Inhalation Daily  . insulin aspart  0-9 Units Subcutaneous TID WC  . [START ON 01/20/2016] levothyroxine  50 mcg Oral Once per day on Sun Tue Wed Fri Sat  . levothyroxine  75 mcg Oral Once per day on Mon Thu  . loratadine  10 mg Oral Daily  . metoCLOPramide  5 mg Oral TID AC & HS  . mometasone-formoterol  2 puff Inhalation BID  . montelukast  10 mg Oral QHS  . mupirocin ointment  1 application Nasal BID  . pantoprazole  40 mg Oral BID AC  . predniSONE  5 mg Oral Q breakfast  . sodium chloride flush  3 mL Intravenous Q12H  . tamsulosin  0.4 mg Oral Daily   Infusions:  . 0.9 % NaCl with KCl 20 mEq / L 75 mL/hr at 01/19/16 5409    Assessment: Patient is a 75 yo female admitted for ARF on top of CKD3. Potassium today of 2.6.  Patient has received KCl 40 meq IV once today.   Current orders for KCl  40 meq po BID and NS with 20 meq of KCl at 75 mL/hr (~36 meq of KCl/24h)  Per PTA med list, patient takes KCl 40 meq po qAM and 20 meq po qPM.  Goal of Therapy:  K: 3.5-5.1 Mag: 1.7-2.4  Plan: No further supplementation warranted at this time. Will recheck electrolytes with am labs.   Pharmacy will continue to monitor and adjust per consult.     Simpson,Michael L 01/19/2016,3:42 PM

## 2016-01-19 NOTE — Progress Notes (Signed)
RN was notified by lab of alert potassium value. Dr. Tobi BastosPyreddy notified. New orders received. Continue to monitor.

## 2016-01-19 NOTE — Progress Notes (Signed)
ANTICOAGULATION CONSULT NOTE - Initial Consult  Pharmacy Consult for warfarin dosing Indication: atrial fibrillation  Allergies  Allergen Reactions  . Amlodipine Swelling and Other (See Comments)    Reaction:  Lip/mouth swelling   . Hydralazine Swelling and Other (See Comments)    Reaction:  Lip/mouth swelling   . Iohexol Hives and Other (See Comments)    Desc: Pt developed hives after receiving iv dye for ct scan.  suggest she be premedicated for future exams, Onset Date: [id number]   . Metoprolol Swelling and Other (See Comments)    Reaction:  Lip/mouth swelling   . Olmesartan Swelling and Other (See Comments)    Reaction:  Lip/mouth swelling   . Ramipril Swelling and Other (See Comments)    Reaction:  Lip/mouth swelling   . Sulfa Antibiotics Swelling and Other (See Comments)    Reaction:  Lip/mouth swelling   . Telmisartan Swelling and Other (See Comments)    Reaction:  Lip/mouth swelling   . Tiotropium Swelling and Other (See Comments)    Reaction:  Lip/mouth swelling     Patient Measurements: Height:  (160 cm) Weight: 226 lb 1.6 oz (102.558 kg) IBW/kg (Calculated) : 52.4   Vital Signs: Temp: 97.4 F (36.3 C) (04/13 0451) Temp Source: Oral (04/13 0451) BP: 102/56 mmHg (04/13 0451) Pulse Rate: 64 (04/13 0451)  Labs:  Recent Labs  01/18/16 1346 01/19/16 0601  HGB 10.5* 9.8*  HCT 32.0* 29.3*  PLT 233 208  APTT 41*  --   LABPROT 38.2* 40.9*  INR 4.06* 4.41*  CREATININE 2.73* 2.27*  TROPONINI 0.03  --     Estimated Creatinine Clearance: 24.9 mL/min (by C-G formula based on Cr of 2.27).   Medical History: Past Medical History  Diagnosis Date  . COPD (chronic obstructive pulmonary disease) (HCC)   . Hypertension   . Atrial fibrillation (HCC)   . CHF (congestive heart failure) (HCC)   . Chronic kidney disease   . Chronic respiratory failure (HCC)     Medications:  Scheduled:  . aspirin EC  81 mg Oral Daily  . atenolol  25 mg Oral BID  .  atorvastatin  20 mg Oral QHS  . Chlorhexidine Gluconate Cloth  6 each Topical Q0600  . diltiazem  180 mg Oral Daily  . docusate sodium  100 mg Oral BID  . escitalopram  20 mg Oral Daily  . fluticasone furoate-vilanterol  1 puff Inhalation Daily  . insulin aspart  0-9 Units Subcutaneous TID WC  . [START ON 01/20/2016] levothyroxine  50 mcg Oral Once per day on Sun Tue Wed Fri Sat  . levothyroxine  75 mcg Oral Once per day on Mon Thu  . loratadine  10 mg Oral Daily  . metoCLOPramide  5 mg Oral TID AC & HS  . mometasone-formoterol  2 puff Inhalation BID  . montelukast  10 mg Oral QHS  . mupirocin ointment  1 application Nasal BID  . pantoprazole  40 mg Oral BID AC  . potassium chloride  10 mEq Intravenous Q1 Hr x 4  . predniSONE  5 mg Oral Q breakfast  . sodium chloride flush  3 mL Intravenous Q12H  . tamsulosin  0.4 mg Oral Daily   Infusions:  . 0.9 % NaCl with KCl 20 mEq / L 75 mL/hr at 01/19/16 7846    Assessment: Pharmacy consulted to dose warfarin in a 75 yo female admitted with ARF with CKD3.  Patient with paroxysmal atrial fibrillation requiring chronic warfarin therapy.  PTA dosing of warfarin 1 mg alternating every other day with 2 mg every other day.  Per PTA med list, last dose was 1 mg on 4/11. INR on admission of 4.06.  No warfarin received on 4/12.   INR today of 4.41    Goal of Therapy:  INR 2-3 Monitor platelets by anticoagulation protocol: Yes   Plan:  INR supratherapeutic today at 4.41.  Will continue to hold warfarin and recheck INR in AM.  May consider decreasing dosing to warfarin 1 mg po daily once INR is <3.    Pharmacy will continue to follow.   Jaimes Eckert G 01/19/2016,7:32 AM

## 2016-01-19 NOTE — Consult Note (Signed)
[facility] Face-to-Face Psychiatry Consult   Reason for Consult:  Consult for this 75 year old woman with a history of depression and anxiety currently in the hospital dehydrated having acute renal failure. Referring Physician:  Modi Patient Identification: Maureen Wilson MRN:  [id number] Principal Diagnosis: Depression, major, recurrent, moderate ([place]) Diagnosis:   Patient Active Problem List   Diagnosis Date Noted  . Grief [F43.21] 01/19/2016  . Depression, major, recurrent, moderate ([place]) [F33.1] 01/19/2016  . ARF (acute renal failure) ([place]) [N17.9] 01/18/2016  . Sepsis ([place]) [A41.9] 01/18/2016  . Aspiration pneumonia ([place]) [J69.0] 10/01/2015  . Fall [W19.XXXA] 09/29/2015  . Gait instability [R26.81] 09/29/2015    Total Time spent with patient: 1 hour  Subjective:   Maureen Wilson is a 75 y.o. female patient admitted with "I haven't been doing so well".  HPI:  Patient interviewed. Chart reviewed including mild note from years ago.'s reviewed labs and vitals and medication. 75 year old woman currently in the hospital with dehydration probably prerenal. Patient says that her daughter passed away about 2 weeks ago. She puts up a good front and says that she's been doing okay but admits that it's been very hard on her. She has been crying regularly. She sleeps reasonably okay at night with her current medicine. She says she is still been eating well. She denies any suicidal thoughts at all. Denies any psychotic symptoms. She does admit eventually that she has been eating and drinking quite poorly at home which probably resulted in her hospitalization. She continues to see her outpatient primary care doctor and is on antidepressant medicine.  Social history: Daughter recently passed away. She still has support from her husband and family. Not working outside the home. Feels like she has good relationships with her family.  Medical history: Multiple medical problems. History of acute  renal failure history of sepsis now resolving. Overweight.  Substance abuse history: Denies alcohol or drug abuse past or present  Past Psychiatric History: History of long-standing chronic anxiety. Chronic use of clonazepam. No history of suicide attempts or psychiatric hospitalization.  Risk to Self: Is patient at risk for suicide?: No Risk to Others:   Prior Inpatient Therapy:   Prior Outpatient Therapy:    Past Medical History:  Past Medical History  Diagnosis Date  . COPD (chronic obstructive pulmonary disease) ([place])   . Hypertension   . Atrial fibrillation ([place])   . CHF (congestive heart failure) ([place])   . Chronic kidney disease   . Chronic respiratory failure [facility])     Past Surgical History  Procedure Laterality Date  . Partial hip arthroplasty Right   . Total hip arthroplasty Left   . Abdominal hysterectomy     Family History:  Family History  Problem Relation Age of Onset  . Hypertension Son    Family Psychiatric  History: Positive for anxiety Social History:  History  Alcohol Use No     History  Drug Use No    Social History   Social History  . Marital Status: Married    Spouse Name: N/A  . Number of Children: N/A  . Years of Education: N/A   Occupational History  . retired    Social History Main Topics  . Smoking status: Never Smoker   . Smokeless tobacco: Never Used  . Alcohol Use: No  . Drug Use: No  . Sexual Activity: Not Asked   Other Topics Concern  . None   Social History Narrative   Additional Social History:    Allergies:  Allergies  Allergen Reactions  . Amlodipine Swelling and Other (See Comments)    Reaction:  Lip/mouth swelling   . Hydralazine Swelling and Other (See Comments)    Reaction:  Lip/mouth swelling   . Iohexol Hives and Other (See Comments)    Desc: Pt developed hives after receiving iv dye for ct scan.  suggest she be premedicated for future exams, Onset Date: [id number]   . Metoprolol Swelling and Other  (See Comments)    Reaction:  Lip/mouth swelling   . Olmesartan Swelling and Other (See Comments)    Reaction:  Lip/mouth swelling   . Ramipril Swelling and Other (See Comments)    Reaction:  Lip/mouth swelling   . Sulfa Antibiotics Swelling and Other (See Comments)    Reaction:  Lip/mouth swelling   . Telmisartan Swelling and Other (See Comments)    Reaction:  Lip/mouth swelling   . Tiotropium Swelling and Other (See Comments)    Reaction:  Lip/mouth swelling     Labs:  Results for orders placed or performed during the hospital encounter of 01/18/16 (from the past 48 hour(s))  Basic metabolic panel     Status: Abnormal   Collection Time: 01/18/16  1:46 PM  Result Value Ref Range   Sodium 129 (L) 135 - 145 mmol/L   Potassium 3.3 (L) 3.5 - 5.1 mmol/L   Chloride 84 (L) 101 - 111 mmol/L   CO2 33 (H) 22 - 32 mmol/L   Glucose, Bld 146 (H) 65 - 99 mg/dL   BUN 33 (H) 6 - 20 mg/dL   Creatinine, Ser 2.73 (H) 0.44 - 1.00 mg/dL   Calcium 9.3 8.9 - 10.3 mg/dL   GFR calc non Af Amer 16 (L) >60 mL/min   GFR calc Af Amer 19 (L) >60 mL/min    Comment: (NOTE) The eGFR has been calculated using the CKD EPI equation. This calculation has not been validated in all clinical situations. eGFR's persistently <60 mL/min signify possible Chronic Kidney Disease.    Anion gap 12 5 - 15  CBC     Status: Abnormal   Collection Time: 01/18/16  1:46 PM  Result Value Ref Range   WBC 7.9 3.6 - 11.0 K/uL   RBC 3.70 (L) 3.80 - 5.20 MIL/uL   Hemoglobin 10.5 (L) 12.0 - 16.0 g/dL   HCT 32.0 (L) 35.0 - 47.0 %   MCV 86.4 80.0 - 100.0 fL   MCH 28.4 26.0 - 34.0 pg   MCHC 32.9 32.0 - 36.0 g/dL   RDW 16.2 (H) 11.5 - 14.5 %   Platelets 233 150 - 440 K/uL  Troponin I     Status: None   Collection Time: 01/18/16  1:46 PM  Result Value Ref Range   Troponin I 0.03 <0.031 ng/mL    Comment:        NO INDICATION OF MYOCARDIAL INJURY.   Magnesium     Status: None   Collection Time: 01/18/16  1:46 PM  Result Value  Ref Range   Magnesium 2.1 1.7 - 2.4 mg/dL  Protime-INR     Status: Abnormal   Collection Time: 01/18/16  1:46 PM  Result Value Ref Range   Prothrombin Time 38.2 (H) 11.4 - 15.0 seconds   INR 4.06 (HH)     Comment: CRITICAL RESULT CALLED TO, READ BACK BY AND VERIFIED WITH: TERRY BROGAN AT 1515 ON 01/18/16 BY KBH   APTT     Status: Abnormal   Collection Time: 01/18/16  1:46 PM  Result Value Ref Range   aPTT 41 (H) 24 - 36 seconds    Comment:        IF BASELINE aPTT IS ELEVATED, SUGGEST PATIENT RISK ASSESSMENT BE USED TO DETERMINE APPROPRIATE ANTICOAGULANT THERAPY.   Urinalysis complete, with microscopic ([facility] only)     Status: Abnormal   Collection Time: 01/18/16  2:27 PM  Result Value Ref Range   Color, Urine YELLOW (A) YELLOW   APPearance CLEAR (A) CLEAR   Glucose, UA NEGATIVE NEGATIVE mg/dL   Bilirubin Urine NEGATIVE NEGATIVE   Ketones, ur NEGATIVE NEGATIVE mg/dL   Specific Gravity, Urine 1.005 1.005 - 1.030   Hgb urine dipstick NEGATIVE NEGATIVE   pH 7.0 5.0 - 8.0   Protein, ur NEGATIVE NEGATIVE mg/dL   Nitrite NEGATIVE NEGATIVE   Leukocytes, UA NEGATIVE NEGATIVE   RBC / HPF NONE SEEN 0 - 5 RBC/hpf   WBC, UA NONE SEEN 0 - 5 WBC/hpf   Bacteria, UA RARE (A) NONE SEEN   Squamous Epithelial / LPF 0-5 (A) NONE SEEN   Mucous PRESENT   Urine culture     Status: None (Preliminary result)   Collection Time: 01/18/16  2:27 PM  Result Value Ref Range   Specimen Description URINE, RANDOM    Special Requests Normal    Culture NO GROWTH < 24 HOURS    Report Status PENDING   Lactic acid, plasma     Status: Abnormal   Collection Time: 01/18/16  2:48 PM  Result Value Ref Range   Lactic Acid, Venous 2.2 (HH) 0.5 - 2.0 mmol/L    Comment: CRITICAL RESULT CALLED TO, READ BACK BY AND VERIFIED WITH TERRY BROGAN AT 1535 01/18/2016 BY TFK   Glucose, capillary     Status: Abnormal   Collection Time: 01/18/16  9:30 PM  Result Value Ref Range   Glucose-Capillary 134 (H) 65 - 99 mg/dL    Comment 1 Notify RN   MRSA PCR Screening     Status: Abnormal   Collection Time: 01/18/16 10:30 PM  Result Value Ref Range   MRSA by PCR POSITIVE (A) NEGATIVE    Comment:        The GeneXpert MRSA Assay (FDA approved for NASAL specimens only), is one component of a comprehensive MRSA colonization surveillance program. It is not intended to diagnose MRSA infection nor to guide or monitor treatment for MRSA infections. CRITICAL RESULT CALLED TO, READ BACK BY AND VERIFIED WITH: BY [facility] FISHER AT 0006 01/19/16 KLK   Basic metabolic panel     Status: Abnormal   Collection Time: 01/19/16  6:01 AM  Result Value Ref Range   Sodium 134 (L) 135 - 145 mmol/L   Potassium 2.6 (LL) 3.5 - 5.1 mmol/L    Comment: CRITICAL RESULT CALLED TO, READ BACK BY AND VERIFIED WITH BRITTNEY CLINGAN @ 0644 ON 01/19/2016 BY CAF    Chloride 93 (L) 101 - 111 mmol/L   CO2 31 22 - 32 mmol/L   Glucose, Bld 98 65 - 99 mg/dL   BUN 29 (H) 6 - 20 mg/dL   Creatinine, Ser 2.27 (H) 0.44 - 1.00 mg/dL   Calcium 8.7 (L) 8.9 - 10.3 mg/dL   GFR calc non Af Amer 20 (L) >60 mL/min   GFR calc Af Amer 23 (L) >60 mL/min    Comment: (NOTE) The eGFR has been calculated using the CKD EPI equation. This calculation has not been validated in all clinical situations. eGFR's persistently <60 mL/min signify possible Chronic Kidney  Disease.    Anion gap 10 5 - 15  Protime-INR     Status: Abnormal   Collection Time: 01/19/16  6:01 AM  Result Value Ref Range   Prothrombin Time 40.9 (H) 11.4 - 15.0 seconds   INR 4.41 (HH)     Comment: CRITICAL RESULT CALLED TO, READ BACK BY AND VERIFIED WITH: [place] AT 6789 ON 01/19/16.Marland KitchenMarland KitchenHuntsville Endoscopy Center   CBC     Status: Abnormal   Collection Time: 01/19/16  6:01 AM  Result Value Ref Range   WBC 6.7 3.6 - 11.0 K/uL   RBC 3.44 (L) 3.80 - 5.20 MIL/uL   Hemoglobin 9.8 (L) 12.0 - 16.0 g/dL   HCT 29.3 (L) 35.0 - 47.0 %   MCV 85.1 80.0 - 100.0 fL   MCH 28.4 26.0 - 34.0 pg   MCHC 33.4 32.0 - 36.0  g/dL   RDW 16.5 (H) 11.5 - 14.5 %   Platelets 208 150 - 440 K/uL  Magnesium     Status: None   Collection Time: 01/19/16  6:01 AM  Result Value Ref Range   Magnesium 2.0 1.7 - 2.4 mg/dL  Glucose, capillary     Status: None   Collection Time: 01/19/16  8:23 AM  Result Value Ref Range   Glucose-Capillary 98 65 - 99 mg/dL   Comment 1 Notify RN   Glucose, capillary     Status: Abnormal   Collection Time: 01/19/16 11:21 AM  Result Value Ref Range   Glucose-Capillary 138 (H) 65 - 99 mg/dL   Comment 1 Notify RN   Potassium     Status: None   Collection Time: 01/19/16  1:13 PM  Result Value Ref Range   Potassium 3.7 3.5 - 5.1 mmol/L  TSH     Status: None   Collection Time: 01/19/16  1:13 PM  Result Value Ref Range   TSH 1.209 0.350 - 4.500 uIU/mL  Glucose, capillary     Status: Abnormal   Collection Time: 01/19/16  4:44 PM  Result Value Ref Range   Glucose-Capillary 140 (H) 65 - 99 mg/dL   Comment 1 Notify RN     Current Facility-Administered Medications  Medication Dose Route Frequency Provider Last Rate Last Dose  . 0.9 % NaCl with KCl 20 mEq/ L  infusion   Intravenous Continuous Hillary Bow, MD 75 mL/hr at 01/19/16 0626    . acetaminophen (TYLENOL) tablet 650 mg  650 mg Oral Q6H PRN Hillary Bow, MD       Or  . acetaminophen (TYLENOL) suppository 650 mg  650 mg Rectal Q6H PRN Srikar Sudini, MD      . albuterol (PROVENTIL) (2.5 MG/3ML) 0.083% nebulizer solution 2.5 mg  2.5 mg Nebulization Q2H PRN Srikar Sudini, MD      . aspirin EC tablet 81 mg  81 mg Oral Daily Hillary Bow, MD   81 mg at 01/19/16 1035  . atorvastatin (LIPITOR) tablet 20 mg  20 mg Oral QHS Hillary Bow, MD   20 mg at 01/18/16 2255  . Chlorhexidine Gluconate Cloth 2 % PADS 6 each  6 each Topical Q0600 Srikar Sudini, MD      . clonazePAM [place]bye [place]) tablet 0.25 mg  0.25 mg Oral BID PRN Hillary Bow, MD   0.25 mg at 01/19/16 1933  . docusate sodium (COLACE) capsule 100 mg  100 mg Oral BID Hillary Bow, MD    100 mg at 01/19/16 1035  . escitalopram (LEXAPRO) tablet 20 mg  20 mg Oral Daily Srikar  Sudini, MD   20 mg at 01/19/16 1035  . fluticasone (FLONASE) 50 MCG/ACT nasal spray 2 spray  2 spray Each Nare Daily PRN Srikar Sudini, MD      . fluticasone furoate-vilanterol (BREO ELLIPTA) 100-25 MCG/INH 1 puff  1 puff Inhalation Daily Hillary Bow, MD   1 puff at 01/19/16 1036  . insulin aspart (novoLOG) injection 0-9 Units  0-9 Units Subcutaneous TID WC Hillary Bow, MD   1 Units at 01/19/16 1717  . [START ON 01/20/2016] levothyroxine (SYNTHROID, LEVOTHROID) tablet 50 mcg  50 mcg Oral Once per day on Sun Tue Wed Fri Sat Hillary Bow, MD      . levothyroxine (SYNTHROID, LEVOTHROID) tablet 75 mcg  75 mcg Oral Once per day on Mon Thu Srikar Sudini, MD   75 mcg at 01/19/16 4888  . loratadine (CLARITIN) tablet 10 mg  10 mg Oral Daily Hillary Bow, MD   10 mg at 01/19/16 1035  . mometasone-formoterol (DULERA) 200-5 MCG/ACT inhaler 2 puff  2 puff Inhalation BID Hillary Bow, MD   2 puff at 01/19/16 9169  . montelukast (SINGULAIR) tablet 10 mg  10 mg Oral QHS Hillary Bow, MD   10 mg at 01/18/16 2255  . mupirocin ointment (BACTROBAN) 2 % 1 application  1 application Nasal BID Hillary Bow, MD   1 application at 45/03/88 1036  . ondansetron (ZOFRAN) tablet 4 mg  4 mg Oral Q6H PRN Hillary Bow, MD       Or  . ondansetron (ZOFRAN) injection 4 mg  4 mg Intravenous Q6H PRN Srikar Sudini, MD      . pantoprazole (PROTONIX) EC tablet 40 mg  40 mg Oral BID AC Srikar Sudini, MD   40 mg at 01/19/16 0833  . polyethylene glycol (MIRALAX / GLYCOLAX) packet 17 g  17 g Oral Daily PRN Srikar Sudini, MD      . predniSONE (DELTASONE) tablet 5 mg  5 mg Oral Q breakfast Hillary Bow, MD   5 mg at 01/19/16 8280  . sodium chloride flush (NS) 0.9 % injection 3 mL  3 mL Intravenous Q12H Srikar Sudini, MD   3 mL at 01/19/16 1000  . tamsulosin (FLOMAX) capsule 0.4 mg  0.4 mg Oral Daily Hillary Bow, MD   0.4 mg at 01/19/16 1035     Musculoskeletal: Strength & Muscle Tone: flaccid Gait & Station: normal Patient leans: N/A  Psychiatric Specialty Exam: Review of Systems  Constitutional: Negative.   HENT: Negative.   Eyes: Negative.   Respiratory: Negative.   Cardiovascular: Negative.   Gastrointestinal: Negative.   Musculoskeletal: Negative.   Skin: Negative.   Neurological: Negative.   Psychiatric/Behavioral: Positive for depression. Negative for suicidal ideas, hallucinations, memory loss and substance abuse. The patient is nervous/anxious and has insomnia.     Blood pressure 106/52, pulse 93, temperature 98.3 F (36.8 C), temperature source Oral, resp. rate 20, height '5\' 3"'  (1.6 m), weight 102.558 kg (226 lb 1.6 oz), SpO2 99 %.Body mass index is 40.06 kg/(m^2).  General Appearance: Casual  Eye Contact::  Fair  Speech:  Slow  Volume:  Decreased  Mood:  Depressed  Affect:  Constricted  Thought Process:  Intact  Orientation:  Full (Time, Place, and Person)  Thought Content:  Negative  Suicidal Thoughts:  No  Homicidal Thoughts:  No  Memory:  Immediate;   Good Recent;   Fair Remote;   Fair  Judgement:  Fair  Insight:  Fair  Psychomotor Activity:  Decreased  Concentration:  Fair  Recall:  [place]: Fair  Akathisia:  No  Handed:  Right  AIMS (if indicated):     Assets:  Communication Skills Desire for Improvement Financial Resources/Insurance Housing Resilience Social Support  ADL's:  Intact  Cognition: WNL  Sleep:      Treatment Plan Summary: Plan Patient is already on clonazepam 0.25 mg twice a day as well as Lexapro 20 mg a day. No indication to change medicine. Most of her acute symptoms are probably related to grief although there is concern about major depression especially with poor by mouth intake. Patient counseled about the importance of taking care of her own health and about risk factors for developing worsening depression. I will follow-up in the  hospital and keep an eye on how she is doing.  Disposition: Patient does not meet criteria for psychiatric inpatient admission.  Alethia Berthold, MD 01/19/2016 8:06 PM

## 2016-01-19 NOTE — Progress Notes (Signed)
PT Cancellation Note  Patient Details Name: Maureen Wilson MRN: [id number] DOB: 1941-05-28   Cancelled Treatment:    Reason Eval/Treat Not Completed: Patient not medically ready. Chart reviewed. Holding PT evaluation at this time due to K+: 2.6, INR: 4.4, both outside of safe recommended range for participation with PT, per [facility] policy. Will monitor remotely and attempt again at later date/time as medically appropriate.    10:42 AM, 01/19/2016 Rosamaria LintsAllan C Buccola, PT, DPT PRN Physical Therapist - [facility] License # [id number] [phone](ASCOM)  [phone] (mobile)

## 2016-01-19 NOTE — Progress Notes (Signed)
Pt expressed emotional distress and depression due to daughter passing 2 weeks prior.  Spiritual and psych services refused and stated they have been working closely with their preacher.

## 2016-01-19 NOTE — Progress Notes (Signed)
Electrolyte Supplementation NOTE - INITIAL   Pharmacy Consult for Hypokalemia   Allergies  Allergen Reactions  . Amlodipine Swelling and Other (See Comments)    Reaction:  Lip/mouth swelling   . Hydralazine Swelling and Other (See Comments)    Reaction:  Lip/mouth swelling   . Iohexol Hives and Other (See Comments)    Desc: Pt developed hives after receiving iv dye for ct scan.  suggest she be premedicated for future exams, Onset Date: [id number]   . Metoprolol Swelling and Other (See Comments)    Reaction:  Lip/mouth swelling   . Olmesartan Swelling and Other (See Comments)    Reaction:  Lip/mouth swelling   . Ramipril Swelling and Other (See Comments)    Reaction:  Lip/mouth swelling   . Sulfa Antibiotics Swelling and Other (See Comments)    Reaction:  Lip/mouth swelling   . Telmisartan Swelling and Other (See Comments)    Reaction:  Lip/mouth swelling   . Tiotropium Swelling and Other (See Comments)    Reaction:  Lip/mouth swelling     Patient Measurements: Height:  (160 cm) Weight: 226 lb 1.6 oz (102.558 kg) IBW/kg (Calculated) : 52.4   Vital Signs: Temp: 97.4 F (36.3 C) (04/13 0451) Temp Source: Oral (04/13 0451) BP: 102/56 mmHg (04/13 0451) Pulse Rate: 64 (04/13 0451) Intake/Output from previous day: 04/12 0701 - 04/13 0700 In: 240 [P.O.:240] Out: 1400 [Urine:1400] Intake/Output from this shift:    Labs:  Recent Labs  01/18/16 1346 01/19/16 0601  WBC 7.9 6.7  HGB 10.5* 9.8*  HCT 32.0* 29.3*  PLT 233 208  APTT 41*  --   CREATININE 2.73* 2.27*  MG 2.1  --    Estimated Creatinine Clearance: 24.9 mL/min (by C-G formula based on Cr of 2.27).   Microbiology: Recent Results (from the past 720 hour(s))  MRSA PCR Screening     Status: Abnormal   Collection Time: 01/18/16 10:30 PM  Result Value Ref Range Status   MRSA by PCR POSITIVE (A) NEGATIVE Final    Comment:        The GeneXpert MRSA Assay (FDA approved for NASAL specimens only), is one  component of a comprehensive MRSA colonization surveillance program. It is not intended to diagnose MRSA infection nor to guide or monitor treatment for MRSA infections. CRITICAL RESULT CALLED TO, READ BACK BY AND VERIFIED WITH: BY [facility] FISHER AT 0006 01/19/16 KLK     Medical History: Past Medical History  Diagnosis Date  . COPD (chronic obstructive pulmonary disease) ([facility])   . Hypertension   . Atrial fibrillation ([facility])   . CHF (congestive heart failure) ([facility])   . Chronic kidney disease   . Chronic respiratory failure ([facility])     Medications:  Scheduled:  . aspirin EC  81 mg Oral Daily  . atenolol  25 mg Oral BID  . atorvastatin  20 mg Oral QHS  . Chlorhexidine Gluconate Cloth  6 each Topical Q0600  . diltiazem  180 mg Oral Daily  . docusate sodium  100 mg Oral BID  . escitalopram  20 mg Oral Daily  . fluticasone furoate-vilanterol  1 puff Inhalation Daily  . insulin aspart  0-9 Units Subcutaneous TID WC  . [START ON 01/20/2016] levothyroxine  50 mcg Oral Once per day on Sun Tue Wed Fri Sat  . levothyroxine  75 mcg Oral Once per day on Mon Thu  . loratadine  10 mg Oral Daily  . metoCLOPramide  5 mg Oral TID AC &  HS  . mometasone-formoterol  2 puff Inhalation BID  . montelukast  10 mg Oral QHS  . mupirocin ointment  1 application Nasal BID  . pantoprazole  40 mg Oral BID AC  . potassium chloride  10 mEq Intravenous Q1 Hr x 4  . predniSONE  5 mg Oral Q breakfast  . sodium chloride flush  3 mL Intravenous Q12H  . tamsulosin  0.4 mg Oral Daily   Infusions:  . 0.9 % NaCl with KCl 20 mEq / L 75 mL/hr at 01/19/16 [id number]    Assessment: Patient is a 75 yo female admitted for ARF on top of CKD3. Potassium today of 2.6.  Patient has received KCl 40 meq po once today.  Magnesium level is pending.    Current orders for KCl 40 meq po BID and NS with 20 meq of KCl at 75 mL/hr (~36 meq of KCl/24h)  Per PTA med list, patient takes KCl 40 meq po qAM and 20 meq po qPM.  Goal of  Therapy:  K: 3.5-5.1 Mag: 1.7-2.4  Plan:  Will discontinue current orders for KCl 40 meq po BID and replace with IV KCl 40 meq once.  Will recheck potassium level 1 hour after last dose today at 1300.  Magnesium level pending.  Pharmacy will continue to follow.   Jecenia Leamer G 01/19/2016,7:37 AM

## 2016-01-20 LAB — GLUCOSE, CAPILLARY
GLUCOSE-CAPILLARY: 110 mg/dL — AB (ref 65–99)
GLUCOSE-CAPILLARY: 151 mg/dL — AB (ref 65–99)
Glucose-Capillary: 82 mg/dL (ref 65–99)

## 2016-01-20 LAB — URINE CULTURE
CULTURE: NO GROWTH
Special Requests: NORMAL

## 2016-01-20 LAB — BASIC METABOLIC PANEL
Anion gap: 9 (ref 5–15)
BUN: 26 mg/dL — AB (ref 6–20)
CHLORIDE: 100 mmol/L — AB (ref 101–111)
CO2: 25 mmol/L (ref 22–32)
Calcium: 8.5 mg/dL — ABNORMAL LOW (ref 8.9–10.3)
Creatinine, Ser: 1.76 mg/dL — ABNORMAL HIGH (ref 0.44–1.00)
GFR calc Af Amer: 32 mL/min — ABNORMAL LOW (ref >60)
GFR calc non Af Amer: 27 mL/min — ABNORMAL LOW (ref >60)
GLUCOSE: 97 mg/dL (ref 65–99)
POTASSIUM: 3.1 mmol/L — AB (ref 3.5–5.1)
Sodium: 134 mmol/L — ABNORMAL LOW (ref 135–145)

## 2016-01-20 LAB — PROTIME-INR
INR: 4.45
Prothrombin Time: 41.2 seconds — ABNORMAL HIGH (ref 11.4–15.0)

## 2016-01-20 MED ORDER — DILTIAZEM HCL ER COATED BEADS 120 MG PO CP24
120.0000 mg | ORAL_CAPSULE | Freq: Every day | ORAL | Status: DC
  Administered 2016-01-20: 120 mg via ORAL
  Filled 2016-01-20: qty 1

## 2016-01-20 MED ORDER — POTASSIUM CHLORIDE CRYS ER 20 MEQ PO TBCR
40.0000 meq | EXTENDED_RELEASE_TABLET | ORAL | Status: AC
  Administered 2016-01-20 (×2): 40 meq via ORAL
  Filled 2016-01-20 (×2): qty 2

## 2016-01-20 MED ORDER — ENSURE ENLIVE PO LIQD
237.0000 mL | Freq: Two times a day (BID) | ORAL | Status: DC
  Administered 2016-01-20: 237 mL via ORAL

## 2016-01-20 MED ORDER — POTASSIUM CHLORIDE IN NACL 20-0.9 MEQ/L-% IV SOLN
INTRAVENOUS | Status: AC
  Administered 2016-01-20: 1000 mL via INTRAVENOUS
  Filled 2016-01-20: qty 1000

## 2016-01-20 MED ORDER — WARFARIN - PHARMACIST DOSING INPATIENT: Freq: Every day | Status: DC

## 2016-01-20 NOTE — Progress Notes (Signed)
Dr. Elpidio AnisSudini notified of critical INR 4.45; Windy Carinaurner,Chriss Redel K, RN 01/20/2016 7:39 AM

## 2016-01-20 NOTE — Progress Notes (Signed)
PT Cancellation Note  Patient Details Name: Maureen Wilson MRN: [id number] DOB: [birth year]   Cancelled Treatment:    Reason Eval/Treat Not Completed: Patient not medically ready. Chart reviewed, RN consulted. Holding pt treatment at this time due to elevated INR beyond safe range per hospital policy. Will attempt at later date/time.   9:40 AM, 01/20/2016 Rosamaria LintsAllan C Srah Ake, PT, DPT PRN Physical Therapist - [facility] License # [id number] [phone](ASCOM)  [phone] (mobile)

## 2016-01-20 NOTE — Progress Notes (Signed)
[facility] [place] [facility]   PATIENT NAME: Jacques Navyatricia Kinney    MR#:  [id number]  DATE OF BIRTH:  11-Jun-1941  SUBJECTIVE:   Feels better. Bp low. tachycardia with Afib  REVIEW OF SYSTEMS:    Review of Systems  Constitutional: Negative for fever, chills and malaise/fatigue.  HENT: Negative for ear discharge, ear pain, hearing loss, nosebleeds and sore throat.   Eyes: Negative for blurred vision and pain.  Respiratory: Negative for cough, hemoptysis, shortness of breath and wheezing.   Cardiovascular: Negative for chest pain, palpitations and leg swelling.  Gastrointestinal: Negative for nausea, vomiting, abdominal pain, diarrhea and blood in stool.  Genitourinary: Negative for dysuria.  Musculoskeletal: Negative for back pain.  Neurological: Positive for weakness. Negative for dizziness, tremors, speech change, focal weakness, seizures and headaches.  Endo/Heme/Allergies: Does not bruise/bleed easily.  Psychiatric/Behavioral: Negative for depression, suicidal ideas and hallucinations.   Tolerating Diet:yes   DRUG ALLERGIES:   Allergies  Allergen Reactions  . Amlodipine Swelling and Other (See Comments)    Reaction:  Lip/mouth swelling   . Hydralazine Swelling and Other (See Comments)    Reaction:  Lip/mouth swelling   . Iohexol Hives and Other (See Comments)    Desc: Pt developed hives after receiving iv dye for ct scan.  suggest she be premedicated for future exams, Onset Date: [id number]   . Metoprolol Swelling and Other (See Comments)    Reaction:  Lip/mouth swelling   . Olmesartan Swelling and Other (See Comments)    Reaction:  Lip/mouth swelling   . Ramipril Swelling and Other (See Comments)    Reaction:  Lip/mouth swelling   . Sulfa Antibiotics Swelling and Other (See Comments)    Reaction:  Lip/mouth swelling   . Telmisartan Swelling and Other (See Comments)    Reaction:  Lip/mouth swelling   . Tiotropium Swelling and Other (See  Comments)    Reaction:  Lip/mouth swelling     VITALS:  Blood pressure 106/45, pulse 124, temperature 97.8 F (36.6 C), temperature source Oral, resp. rate 15, height 5\' 3"  (1.6 m), weight 103.103 kg (227 lb 4.8 oz), SpO2 98 %.  PHYSICAL EXAMINATION:   Physical Exam  Constitutional: She is oriented to person, place, and time and well-developed, well-nourished, and in no distress. No distress.  HENT:  Head: Normocephalic.  Eyes: No scleral icterus.  Neck: Normal range of motion. Neck supple. No JVD present. No tracheal deviation present.  Cardiovascular: Normal rate, regular rhythm and normal heart sounds.  Exam reveals no gallop and no friction rub.   No murmur heard. Pulmonary/Chest: Effort normal and breath sounds normal. No respiratory distress. She has no wheezes. She has no rales. She exhibits no tenderness.  Abdominal: Soft. Bowel sounds are normal. She exhibits no distension and no mass. There is no tenderness. There is no rebound and no guarding.  Musculoskeletal: Normal range of motion. She exhibits no edema.  Neurological: She is alert and oriented to person, place, and time.  Skin: Skin is warm. No rash noted. No erythema.  Psychiatric: Affect and judgment normal.    LABORATORY PANEL:   CBC  Recent Labs Lab 01/19/16 0601  WBC 6.7  HGB 9.8*  HCT 29.3*  PLT 208   ------------------------------------------------------------------------------------------------------------------  Chemistries   Recent Labs Lab 01/19/16 0601  01/20/16 0450  NA 134*  --  134*  K 2.6*  < > 3.1*  CL 93*  --  100*  CO2 31  --  25  GLUCOSE 98  --  97  BUN 29*  --  26*  CREATININE 2.27*  --  1.76*  CALCIUM 8.7*  --  8.5*  MG 2.0  --   --   < > = values in this interval not displayed. ------------------------------------------------------------------------------------------------------------------  Cardiac Enzymes  Recent Labs Lab 01/18/16 1346  TROPONINI 0.03    ------------------------------------------------------------------------------------------------------------------  RADIOLOGY:  Dg Chest 2 View  01/18/2016  CLINICAL DATA:  Weakness, nausea for 3 days, shortness of breath with exertion, coronary artery disease post stenting 3 years ago, hypertension, CHF, COPD, atrial fibrillation EXAM: CHEST  2 VIEW COMPARISON:  10/03/2015 FINDINGS: Enlargement of cardiac silhouette. Atherosclerotic calcification aorta. Mediastinal contours and pulmonary vascularity normal. Chronic eventration RIGHT diaphragm. Minimal residual atelectasis LEFT mid lung. Lungs otherwise clear. No infiltrate, pleural effusion or pneumothorax. Improved lung volumes versus previous study. Bones demineralized. IMPRESSION: Enlargement of cardiac silhouette. Improved aeration since previous exam without acute abnormalities. Electronically Signed   By: Ulyses Southward M.D.   On: 01/18/2016 14:56     ASSESSMENT AND PLAN:   75 year old female with PAF, COPD and chronic respiratory failure who presented with weakness and found to have acute renal failure.  1. Acute renal failure: This is due to use of Lasix and metolazone with poor by mouth intake. Slowly improving with  Continue to hold nephrotoxic agents. Repeat BMP in a.m.  Will need to stop Metalazone at discharge and likely continue lasix  2. Hypovolemic hyponatremia: Sodium level has improved with IV fluids.  3. Generalized weakness with hypotension: This is due to acute renal failure  Physical therapy consultation for disposition. Continue IV fluids  4.. Hypokalemia: Magnesium level is normal. This is likely due to diuretic use. Please and recheck in a.m.  5. PAF with RVR Medications held yesterday due to hypotension Continue to hold Coumadin Restart cardizem today. BP low normal. Add atenolol if BP improves Continue to monitor heart rate  6. Depression with recent loss: Continue Lexapro Consulted psych. Appreciate  input  7. Hyperlipidemia: Continue Lipitor  8. Hypothyroidism: Continue Synthroid TSH normal  Management plans discussed with the patient and she is in agreement.  CODE STATUS: FULL  TOTAL TIME TAKING CARE OF THIS PATIENT: 30 minutes.   POSSIBLE D/C tomorrow, DEPENDING ON CLINICAL CONDITION.  Milagros Loll R M.D on 01/20/2016 at 2:15 PM  Between 7am to 6pm - Pager - [phone]  After 6pm go to [email or web address] - password [email or web address] [facility]  [facility]  Office  [phone]  CC: Primary care physician; Joanna Hews, MD  Note: This dictation was prepared with Dragon dictation along with smaller phrase technology. Any transcriptional errors that result from this process are unintentional.

## 2016-01-20 NOTE — Consult Note (Signed)
[facility] Face-to-Face Psychiatry Consult   Reason for Consult:  Consult for this 75 year old woman with a history of depression and anxiety currently in the hospital dehydrated having acute renal failure. Referring Physician:  [place] Patient Identification: Maureen Wilson MRN:  [id number] Principal Diagnosis: Depression, major, recurrent, moderate ([place]) Diagnosis:   Patient Active Problem List   Diagnosis Date Noted  . Grief [F43.21] 01/19/2016  . Depression, major, recurrent, moderate ([place]) [F33.1] 01/19/2016  . ARF (acute renal failure) ([place]) [N17.9] 01/18/2016  . Sepsis ([place]) [A41.9] 01/18/2016  . Aspiration pneumonia ([place]) [J69.0] 10/01/2015  . Fall [W19.XXXA] 09/29/2015  . Gait instability [R26.81] 09/29/2015    Total Time spent with patient: 20 minutes  Subjective:   Maureen Wilson is a 75 y.o. female patient admitted with "I haven't been doing so well".  HPI: Updated interview today the 14th Friday. Patient reports her mood has been feeling pretty good today. She is not aware of feeling particularly depressed. Physically she is feeling better and she is looking forward to going home. She has a realistic plan for self-care at home. Her affect is upbeat and appropriate.Marland Kitchen Her breathing is better. Strength is better.  Social history: Daughter recently passed away. She still has support from her husband and family. Not working outside the home. Feels like she has good relationships with her family.  Medical history: Multiple medical problems. History of acute renal failure history of sepsis now resolving. Overweight.  Substance abuse history: Denies alcohol or drug abuse past or present  Past Psychiatric History: History of long-standing chronic anxiety. Chronic use of clonazepam. No history of suicide attempts or psychiatric hospitalization.  Risk to Self: Is patient at risk for suicide?: No Risk to Others:   Prior Inpatient Therapy:   Prior Outpatient Therapy:    Past  Medical History:  Past Medical History  Diagnosis Date  . COPD (chronic obstructive pulmonary disease) ([place])   . Hypertension   . Atrial fibrillation ([place])   . CHF (congestive heart failure) ([place])   . Chronic kidney disease   . Chronic respiratory failure [facility])     Past Surgical History  Procedure Laterality Date  . Partial hip arthroplasty Right   . Total hip arthroplasty Left   . Abdominal hysterectomy     Family History:  Family History  Problem Relation Age of Onset  . Hypertension Son    Family Psychiatric  History: Positive for anxiety Social History:  History  Alcohol Use No     History  Drug Use No    Social History   Social History  . Marital Status: Married    Spouse Name: N/A  . Number of Children: N/A  . Years of Education: N/A   Occupational History  . retired    Social History Main Topics  . Smoking status: Never Smoker   . Smokeless tobacco: Never Used  . Alcohol Use: No  . Drug Use: No  . Sexual Activity: Not Asked   Other Topics Concern  . None   Social History Narrative   Additional Social History:    Allergies:   Allergies  Allergen Reactions  . Amlodipine Swelling and Other (See Comments)    Reaction:  Lip/mouth swelling   . Hydralazine Swelling and Other (See Comments)    Reaction:  Lip/mouth swelling   . Iohexol Hives and Other (See Comments)    Desc: Pt developed hives after receiving iv dye for ct scan.  suggest she be premedicated for future exams, Onset Date:  [id number]   . Metoprolol Swelling and Other (See Comments)    Reaction:  Lip/mouth swelling   . Olmesartan Swelling and Other (See Comments)    Reaction:  Lip/mouth swelling   . Ramipril Swelling and Other (See Comments)    Reaction:  Lip/mouth swelling   . Sulfa Antibiotics Swelling and Other (See Comments)    Reaction:  Lip/mouth swelling   . Telmisartan Swelling and Other (See Comments)    Reaction:  Lip/mouth swelling   . Tiotropium Swelling and Other (See  Comments)    Reaction:  Lip/mouth swelling     Labs:  Results for orders placed or performed during the hospital encounter of 01/18/16 (from the past 48 hour(s))  Lactic acid, plasma     Status: Abnormal   Collection Time: 01/18/16  2:48 PM  Result Value Ref Range   Lactic Acid, Venous 2.2 (HH) 0.5 - 2.0 mmol/L    Comment: CRITICAL RESULT CALLED TO, READ BACK BY AND VERIFIED WITH TERRY BROGAN AT 1535 01/18/2016 BY TFK   Glucose, capillary     Status: Abnormal   Collection Time: 01/18/16  9:30 PM  Result Value Ref Range   Glucose-Capillary 134 (H) 65 - 99 mg/dL   Comment 1 Notify RN   MRSA PCR Screening     Status: Abnormal   Collection Time: 01/18/16 10:30 PM  Result Value Ref Range   MRSA by PCR POSITIVE (A) NEGATIVE    Comment:        The GeneXpert MRSA Assay (FDA approved for NASAL specimens only), is one component of a comprehensive MRSA colonization surveillance program. It is not intended to diagnose MRSA infection nor to guide or monitor treatment for MRSA infections. CRITICAL RESULT CALLED TO, READ BACK BY AND VERIFIED WITH: BY [facility] FISHER AT 0006 01/19/16 KLK   Basic metabolic panel     Status: Abnormal   Collection Time: 01/19/16  6:01 AM  Result Value Ref Range   Sodium 134 (L) 135 - 145 mmol/L   Potassium 2.6 (LL) 3.5 - 5.1 mmol/L    Comment: CRITICAL RESULT CALLED TO, READ BACK BY AND VERIFIED WITH BRITTNEY CLINGAN @ 0644 ON 01/19/2016 BY CAF    Chloride 93 (L) 101 - 111 mmol/L   CO2 31 22 - 32 mmol/L   Glucose, Bld 98 65 - 99 mg/dL   BUN 29 (H) 6 - 20 mg/dL   Creatinine, Ser 2.27 (H) 0.44 - 1.00 mg/dL   Calcium 8.7 (L) 8.9 - 10.3 mg/dL   GFR calc non Af Amer 20 (L) >60 mL/min   GFR calc Af Amer 23 (L) >60 mL/min    Comment: (NOTE) The eGFR has been calculated using the CKD EPI equation. This calculation has not been validated in all clinical situations. eGFR's persistently <60 mL/min signify possible Chronic Kidney Disease.    Anion gap 10 5 -  15  Protime-INR     Status: Abnormal   Collection Time: 01/19/16  6:01 AM  Result Value Ref Range   Prothrombin Time 40.9 (H) 11.4 - 15.0 seconds   INR 4.41 (HH)     Comment: CRITICAL RESULT CALLED TO, READ BACK BY AND VERIFIED WITH: Ar[place]ta [place] AT 3267 ON 01/19/16.Marland KitchenMarland KitchenPend Oreille Surgery Center LLC   CBC     Status: Abnormal   Collection Time: 01/19/16  6:01 AM  Result Value Ref Range   WBC 6.7 3.6 - 11.0 K/uL   RBC 3.44 (L) 3.80 - 5.20 MIL/uL   Hemoglobin 9.8 (L) 12.0 - 16.0 g/dL  HCT 29.3 (L) 35.0 - 47.0 %   MCV 85.1 80.0 - 100.0 fL   MCH 28.4 26.0 - 34.0 pg   MCHC 33.4 32.0 - 36.0 g/dL   RDW 16.5 (H) 11.5 - 14.5 %   Platelets 208 150 - 440 K/uL  Magnesium     Status: None   Collection Time: 01/19/16  6:01 AM  Result Value Ref Range   Magnesium 2.0 1.7 - 2.4 mg/dL  Glucose, capillary     Status: None   Collection Time: 01/19/16  8:23 AM  Result Value Ref Range   Glucose-Capillary 98 65 - 99 mg/dL   Comment 1 Notify RN   Glucose, capillary     Status: Abnormal   Collection Time: 01/19/16 11:21 AM  Result Value Ref Range   Glucose-Capillary 138 (H) 65 - 99 mg/dL   Comment 1 Notify RN   Potassium     Status: None   Collection Time: 01/19/16  1:13 PM  Result Value Ref Range   Potassium 3.7 3.5 - 5.1 mmol/L  TSH     Status: None   Collection Time: 01/19/16  1:13 PM  Result Value Ref Range   TSH 1.209 0.350 - 4.500 uIU/mL  Glucose, capillary     Status: Abnormal   Collection Time: 01/19/16  4:44 PM  Result Value Ref Range   Glucose-Capillary 140 (H) 65 - 99 mg/dL   Comment 1 Notify RN   Glucose, capillary     Status: Abnormal   Collection Time: 01/19/16 10:07 PM  Result Value Ref Range   Glucose-Capillary 148 (H) 65 - 99 mg/dL  Basic metabolic panel     Status: Abnormal   Collection Time: 01/20/16  4:50 AM  Result Value Ref Range   Sodium 134 (L) 135 - 145 mmol/L   Potassium 3.1 (L) 3.5 - 5.1 mmol/L   Chloride 100 (L) 101 - 111 mmol/L   CO2 25 22 - 32 mmol/L   Glucose, Bld 97 65 - 99  mg/dL   BUN 26 (H) 6 - 20 mg/dL   Creatinine, Ser 1.76 (H) 0.44 - 1.00 mg/dL   Calcium 8.5 (L) 8.9 - 10.3 mg/dL   GFR calc non Af Amer 27 (L) >60 mL/min   GFR calc Af Amer 32 (L) >60 mL/min    Comment: (NOTE) The eGFR has been calculated using the CKD EPI equation. This calculation has not been validated in all clinical situations. eGFR's persistently <60 mL/min signify possible Chronic Kidney Disease.    Anion gap 9 5 - 15  Protime-INR     Status: Abnormal   Collection Time: 01/20/16  4:50 AM  Result Value Ref Range   Prothrombin Time 41.2 (H) 11.4 - 15.0 seconds   INR 4.45 (HH)     Comment: CRITICAL RESULT CALLED TO, READ BACK BY AND VERIFIED WITH: MARCELLA TURNER AT 0721 ON 01/20/16 BY KBH   Glucose, capillary     Status: None   Collection Time: 01/20/16  7:57 AM  Result Value Ref Range   Glucose-Capillary 82 65 - 99 mg/dL  Glucose, capillary     Status: Abnormal   Collection Time: 01/20/16 11:54 AM  Result Value Ref Range   Glucose-Capillary 110 (H) 65 - 99 mg/dL   Comment 1 Notify RN     Current Facility-Administered Medications  Medication Dose Route Frequency Provider Last Rate Last Dose  . 0.9 % NaCl with KCl 20 mEq/ L  infusion   Intravenous Continuous Hillary Bow, MD      .  acetaminophen (TYLENOL) tablet 650 mg  650 mg Oral Q6H PRN Hillary Bow, MD       Or  . acetaminophen (TYLENOL) suppository 650 mg  650 mg Rectal Q6H PRN Srikar Sudini, MD      . albuterol (PROVENTIL) (2.5 MG/3ML) 0.083% nebulizer solution 2.5 mg  2.5 mg Nebulization Q2H PRN Srikar Sudini, MD      . aspirin EC tablet 81 mg  81 mg Oral Daily Srikar Sudini, MD   81 mg at 01/20/16 1003  . atorvastatin (LIPITOR) tablet 20 mg  20 mg Oral QHS Hillary Bow, MD   20 mg at 01/19/16 2125  . Chlorhexidine Gluconate Cloth 2 % PADS 6 each  6 each Topical Q0600 Srikar Sudini, MD      . clonazePAM Bobbye [place]) tablet 0.25 mg  0.25 mg Oral BID PRN Hillary Bow, MD   0.25 mg at 01/19/16 1933  . diltiazem  (CARDIZEM CD) 24 hr capsule 120 mg  120 mg Oral Daily Srikar Sudini, MD   120 mg at 01/20/16 1002  . docusate sodium (COLACE) capsule 100 mg  100 mg Oral BID Hillary Bow, MD   100 mg at 01/20/16 1003  . escitalopram (LEXAPRO) tablet 20 mg  20 mg Oral Daily Hillary Bow, MD   20 mg at 01/20/16 1003  . feeding supplement (ENSURE ENLIVE) (ENSURE ENLIVE) liquid 237 mL  237 mL Oral BID BM Srikar Sudini, MD      . fluticasone (FLONASE) 50 MCG/ACT nasal spray 2 spray  2 spray Each Nare Daily PRN Srikar Sudini, MD      . fluticasone furoate-vilanterol (BREO ELLIPTA) 100-25 MCG/INH 1 puff  1 puff Inhalation Daily Srikar Sudini, MD   1 puff at 01/20/16 1000  . insulin aspart (novoLOG) injection 0-9 Units  0-9 Units Subcutaneous TID WC Hillary Bow, MD   1 Units at 01/19/16 1717  . levothyroxine (SYNTHROID, LEVOTHROID) tablet 50 mcg  50 mcg Oral Once per day on Sun Tue Wed Fri Sat Hillary Bow, MD   50 mcg at 01/20/16 1001  . levothyroxine (SYNTHROID, LEVOTHROID) tablet 75 mcg  75 mcg Oral Once per day on Mon Thu Hillary Bow, MD   75 mcg at 01/19/16 3295  . loratadine (CLARITIN) tablet 10 mg  10 mg Oral Daily Hillary Bow, MD   10 mg at 01/20/16 1002  . mometasone-formoterol (DULERA) 200-5 MCG/ACT inhaler 2 puff  2 puff Inhalation BID Hillary Bow, MD   2 puff at 01/20/16 1000  . montelukast (SINGULAIR) tablet 10 mg  10 mg Oral QHS Hillary Bow, MD   10 mg at 01/19/16 2125  . mupirocin ointment (BACTROBAN) 2 % 1 application  1 application Nasal BID Hillary Bow, MD   1 application at 18/84/16 1000  . ondansetron (ZOFRAN) tablet 4 mg  4 mg Oral Q6H PRN Hillary Bow, MD       Or  . ondansetron (ZOFRAN) injection 4 mg  4 mg Intravenous Q6H PRN Srikar Sudini, MD      . pantoprazole (PROTONIX) EC tablet 40 mg  40 mg Oral BID AC Srikar Sudini, MD   40 mg at 01/20/16 1003  . polyethylene glycol (MIRALAX / GLYCOLAX) packet 17 g  17 g Oral Daily PRN Srikar Sudini, MD      . potassium chloride SA  (K-DUR,KLOR-CON) CR tablet 40 mEq  40 mEq Oral Q4H Srikar Sudini, MD   40 mEq at 01/20/16 1003  . predniSONE (DELTASONE) tablet 5 mg  5 mg Oral  Q breakfast Hillary Bow, MD   5 mg at 01/20/16 1002  . sodium chloride flush (NS) 0.9 % injection 3 mL  3 mL Intravenous Q12H Srikar Sudini, MD   3 mL at 01/20/16 1005  . tamsulosin (FLOMAX) capsule 0.4 mg  0.4 mg Oral Daily Srikar Sudini, MD   0.4 mg at 01/20/16 1003  . Warfarin - Pharmacist Dosing Inpatient   Does not apply q1800 Bettey Costa, MD        Musculoskeletal: Strength & Muscle Tone: flaccid Gait & Station: normal Patient leans: N/A  Psychiatric Specialty Exam: Review of Systems  Constitutional: Negative.   HENT: Negative.   Eyes: Negative.   Respiratory: Negative.   Cardiovascular: Negative.   Gastrointestinal: Negative.   Musculoskeletal: Negative.   Skin: Negative.   Neurological: Negative.   Psychiatric/Behavioral: Negative for depression, suicidal ideas, hallucinations, memory loss and substance abuse. The patient is not nervous/anxious and does not have insomnia.     Blood pressure 106/45, pulse 124, temperature 97.8 F (36.6 C), temperature source Oral, resp. rate 15, height '5\' 3"'  (1.6 m), weight 103.103 kg (227 lb 4.8 oz), SpO2 98 %.Body mass index is 40.27 kg/(m^2).  General Appearance: Casual  Eye Contact::  Fair  Speech:  Slow  Volume:  Normal  Mood:  Depressed and Euthymic  Affect:  Constricted and Full Range  Thought Process:  Intact  Orientation:  Full (Time, Place, and Person)  Thought Content:  Negative  Suicidal Thoughts:  No  Homicidal Thoughts:  No  Memory:  Immediate;   Good Recent;   Fair Remote;   Fair  Judgement:  Fair  Insight:  Fair  Psychomotor Activity:  Normal  Concentration:  Fair  Recall:  [employer] [place]  Language: Fair  Akathisia:  No  Handed:  Right  AIMS (if indicated):     Assets:  Communication Skills Desire for Improvement Financial  Resources/Insurance Housing Resilience Social Support  ADL's:  Intact  Cognition: WNL  Sleep:      Treatment Plan Summary: Plan Patient appears to be doing better today. No sign of major depression. Appropriate grieving process. No sign of dangerousness. Tolerating medicine well. She wanted to emphasize to me how important it was for her to continue to take her clonazepam at night because it helps her sleep. I suggest no change to her medicine for now. If she is here after the weekend I will check up with her but really she seems to be doing quite well and can continue with the Lexapro and Klonopin.  Disposition: Patient does not meet criteria for psychiatric inpatient admission.  Alethia Berthold, MD 01/20/2016 2:41 PM

## 2016-01-20 NOTE — Evaluation (Signed)
Physical Therapy Evaluation Patient Details Name: Maureen Wilson MRN: [id number] DOB: 02/16/1941 Today's Date: 01/20/2016   History of Present Illness  Maureen Wilson is a 75yo white female who comes to [facility] on 4/12 after a fall at home. Pt admitted for depression, likely related to recent death of daughter, found to be hyponatremic, as well as INR in 4.0's which delayed PT evaluation until medically cleared by Dr. Darvin Neighbours per verbal order.  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 Pt reports 4 falls in the past 6 months, all related to legs giving out over longer household distances. At baseline, pt performd household distance AMB c RW, and is on chronic 2L/min O2, CPAP at night.   Clinical Impression  Pt presenting with mild impairment in balance, gait, strength, and moderate impairment of cardiovascular status and activity tolerance, all established as baseline level of function, but patient is able to perform all basic mobility and ADL safely. The above impairments have limited the patient's ability to perform IADL and resulted in chronic falls at home. Pt is safe for DC to home once medically cleared, however will benefit from HHPT services to address the above deficits to improve independence in IADL and to reduce rate/risk of falls in home. All further PT needs can be met at next venue of care. PT signing off.     Follow Up Recommendations Home health PT    Equipment Recommendations  None recommended by PT    Recommendations for Other Services       Precautions / Restrictions Precautions Precaution Comments: no falls score available.       Mobility  Bed Mobility Overal bed mobility: Independent                Transfers Overall transfer level: Modified independent               General transfer comment: bed rail in LUE; 5x STS in 20.28s  Ambulation/Gait Ambulation/Gait assistance: Supervision Ambulation  Distance (Feet): 80 Feet Assistive device: Rolling walker (2 wheeled)       General Gait Details: Reports to feel really good, and attests to baseline level function.   Stairs            Wheelchair Mobility    Modified Rankin (Stroke Patients Only)       Balance Overall balance assessment: Modified Independent;History of Falls                                           Pertinent Vitals/Pain Pain Assessment: No/denies pain    Home Living Family/patient expects to be discharged to:: Private residence Living Arrangements: Spouse/significant other Available Help at Discharge: Family;Available 24 hours/day Type of Home: House Home Access: Ramped entrance     Home Layout: One level Home Equipment: Walker - 2 wheels;Wheelchair - Regulatory affairs officer - single point Additional Comments: Per daughter they have handicapped accessible "everything" (at visit [place])     Prior Function Level of Independence: Independent with assistive device(s)         Comments: Son assists with groceries, pt indep in ADL, still cooks some.      Hand Dominance        Extremity/Trunk Assessment   Upper Extremity Assessment: Overall WFL for tasks assessed           Lower Extremity Assessment:  Overall [facility] for tasks assessed;Generalized weakness (5x STS in 20.28s)         Communication   Communication: No difficulties  Cognition Arousal/Alertness: Awake/alert Behavior During Therapy: WFL for tasks assessed/performed Overall Cognitive Status: Within Functional Limits for tasks assessed                      General Comments      Exercises        Assessment/Plan    PT Assessment All further PT needs can be met in the next venue of care  PT Diagnosis Abnormality of gait;Generalized weakness   PT Problem List Decreased activity tolerance;Decreased balance;Decreased strength;Cardiopulmonary status limiting activity  PT Treatment Interventions      PT Goals (Current goals can be found in the Care Plan section) Acute Rehab PT Goals PT Goal Formulation: All assessment and education complete, DC therapy    Frequency     Barriers to discharge        Co-evaluation               End of Session Equipment Utilized During Treatment: Gait belt;Oxygen Activity Tolerance: Patient tolerated treatment well;No increased pain Patient left: in chair;with call bell/phone within reach;with nursing/sitter in room Nurse Communication: Mobility status;Other (comment)         Time: 5910-2890 PT Time Calculation (min) (ACUTE ONLY): 21 min   Charges:   PT Evaluation $PT Eval Moderate Complexity: 1 Procedure PT Treatments $Therapeutic Activity: 8-22 mins   PT G Codes:       1:46 PM, February 03, 2016 Etta Grandchild, PT, DPT PRN Physical Therapist - [place] License # [id number] [phone] (mobile)

## 2016-01-20 NOTE — Care Management Important Message (Signed)
Important Message  Patient Details  Name: Maureen Wilson MRN: [id number] Date of Birth: Jun 06, 1941   Medicare Important Message Given:  Yes    Chapman FitchBOWEN, Dwaine Pringle T, RN 01/20/2016, 11:53 AM

## 2016-01-20 NOTE — Progress Notes (Signed)
Electrolyte Supplementation NOTE - Follow up  Pharmacy Consult for Hypokalemia   Allergies  Allergen Reactions  . Amlodipine Swelling and Other (See Comments)    Reaction:  Lip/mouth swelling   . Hydralazine Swelling and Other (See Comments)    Reaction:  Lip/mouth swelling   . Iohexol Hives and Other (See Comments)    Desc: Pt developed hives after receiving iv dye for ct scan.  suggest she be premedicated for future exams, Onset Date: [id number]   . Metoprolol Swelling and Other (See Comments)    Reaction:  Lip/mouth swelling   . Olmesartan Swelling and Other (See Comments)    Reaction:  Lip/mouth swelling   . Ramipril Swelling and Other (See Comments)    Reaction:  Lip/mouth swelling   . Sulfa Antibiotics Swelling and Other (See Comments)    Reaction:  Lip/mouth swelling   . Telmisartan Swelling and Other (See Comments)    Reaction:  Lip/mouth swelling   . Tiotropium Swelling and Other (See Comments)    Reaction:  Lip/mouth swelling     Patient Measurements: Height: 5\' 3"  (160 cm) Weight: 227 lb 4.8 oz (103.103 kg) IBW/kg (Calculated) : 52.4   Vital Signs: Temp: 97.8 F (36.6 C) (04/13 2017) Temp Source: Oral (04/13 2017) BP: 91/64 mmHg (04/13 2017) Pulse Rate: 109 (04/13 2017) Intake/Output from previous day: 04/13 0701 - 04/14 0700 In: 3338.9 [P.O.:660; I.V.:2412.4; IV Piggyback:266.5] Out: 1200 [Urine:1200] Intake/Output from this shift:    Labs:  Recent Labs  01/18/16 1346 01/19/16 0601 01/20/16 0450  WBC 7.9 6.7  --   HGB 10.5* 9.8*  --   HCT 32.0* 29.3*  --   PLT 233 208  --   APTT 41*  --   --   CREATININE 2.73* 2.27* 1.76*  MG 2.1 2.0  --    Lab Results  Component Value Date   K 3.1* 01/20/2016    Estimated Creatinine Clearance: 32.2 mL/min (by C-G formula based on Cr of 1.76).   Medical History: Past Medical History  Diagnosis Date  . COPD (chronic obstructive pulmonary disease) (HCC)   . Hypertension   . Atrial fibrillation (HCC)    . CHF (congestive heart failure) (HCC)   . Chronic kidney disease   . Chronic respiratory failure (HCC)     Medications:  Scheduled:  . aspirin EC  81 mg Oral Daily  . atorvastatin  20 mg Oral QHS  . Chlorhexidine Gluconate Cloth  6 each Topical Q0600  . diltiazem  120 mg Oral Daily  . docusate sodium  100 mg Oral BID  . escitalopram  20 mg Oral Daily  . fluticasone furoate-vilanterol  1 puff Inhalation Daily  . insulin aspart  0-9 Units Subcutaneous TID WC  . levothyroxine  50 mcg Oral Once per day on Sun Tue Wed Fri Sat  . levothyroxine  75 mcg Oral Once per day on Mon Thu  . loratadine  10 mg Oral Daily  . mometasone-formoterol  2 puff Inhalation BID  . montelukast  10 mg Oral QHS  . mupirocin ointment  1 application Nasal BID  . pantoprazole  40 mg Oral BID AC  . potassium chloride  40 mEq Oral Q4H  . predniSONE  5 mg Oral Q breakfast  . sodium chloride flush  3 mL Intravenous Q12H  . tamsulosin  0.4 mg Oral Daily  . Warfarin - Pharmacist Dosing Inpatient   Does not apply q1800   Infusions:  . 0.9 % NaCl with KCl 20  mEq / L 75 mL/hr at 01/19/16 2019    Assessment: Patient is a 75 yo female admitted for ARF on top of CKD3.  Per PTA med list, patient takes KCl 40 meq po qAM and 20 meq po qPM.  Goal of Therapy:  K: 3.5-5.1 Mag: 1.7-2.4  Plan: K= 3.1.  Current orders for KCl 40 meq po q4h x2 per MD and NS with 20 meq of KCl at 50 mL/hr  Will recheck electrolytes with am labs.   Pharmacy will continue to monitor and adjust per consult.     Roniyah Llorens A 01/20/2016,8:11 AM

## 2016-01-20 NOTE — Progress Notes (Signed)
ANTICOAGULATION CONSULT NOTE -Follow up Consult  Pharmacy Consult for warfarin dosing Indication: atrial fibrillation  Allergies  Allergen Reactions  . Amlodipine Swelling and Other (See Comments)    Reaction:  Lip/mouth swelling   . Hydralazine Swelling and Other (See Comments)    Reaction:  Lip/mouth swelling   . Iohexol Hives and Other (See Comments)    Desc: Pt developed hives after receiving iv dye for ct scan.  suggest she be premedicated for future exams, Onset Date: [id number]   . Metoprolol Swelling and Other (See Comments)    Reaction:  Lip/mouth swelling   . Olmesartan Swelling and Other (See Comments)    Reaction:  Lip/mouth swelling   . Ramipril Swelling and Other (See Comments)    Reaction:  Lip/mouth swelling   . Sulfa Antibiotics Swelling and Other (See Comments)    Reaction:  Lip/mouth swelling   . Telmisartan Swelling and Other (See Comments)    Reaction:  Lip/mouth swelling   . Tiotropium Swelling and Other (See Comments)    Reaction:  Lip/mouth swelling     Patient Measurements: Height:  (160 cm) Weight: 227 lb 4.8 oz (103.103 kg) IBW/kg (Calculated) : 52.4   Vital Signs: Temp: 97.8 F (36.6 C) (04/13 2017) Temp Source: Oral (04/13 2017) BP: 91/64 mmHg (04/13 2017) Pulse Rate: 109 (04/13 2017)  Labs:  Recent Labs  01/18/16 1346 01/19/16 0601 01/20/16 0450  HGB 10.5* 9.8*  --   HCT 32.0* 29.3*  --   PLT 233 208  --   APTT 41*  --   --   LABPROT 38.2* 40.9* 41.2*  INR 4.06* 4.41* 4.45*  CREATININE 2.73* 2.27* 1.76*  TROPONINI 0.03  --   --     Estimated Creatinine Clearance: 32.2 mL/min (by C-G formula based on Cr of 1.76).   Medical History: Past Medical History  Diagnosis Date  . COPD (chronic obstructive pulmonary disease) (HCC)   . Hypertension   . Atrial fibrillation (HCC)   . CHF (congestive heart failure) (HCC)   . Chronic kidney disease   . Chronic respiratory failure (HCC)     Medications:  Scheduled:  . aspirin  EC  81 mg Oral Daily  . atorvastatin  20 mg Oral QHS  . Chlorhexidine Gluconate Cloth  6 each Topical Q0600  . diltiazem  120 mg Oral Daily  . docusate sodium  100 mg Oral BID  . escitalopram  20 mg Oral Daily  . fluticasone furoate-vilanterol  1 puff Inhalation Daily  . insulin aspart  0-9 Units Subcutaneous TID WC  . levothyroxine  50 mcg Oral Once per day on Sun Tue Wed Fri Sat  . levothyroxine  75 mcg Oral Once per day on Mon Thu  . loratadine  10 mg Oral Daily  . mometasone-formoterol  2 puff Inhalation BID  . montelukast  10 mg Oral QHS  . mupirocin ointment  1 application Nasal BID  . pantoprazole  40 mg Oral BID AC  . potassium chloride  40 mEq Oral Q4H  . predniSONE  5 mg Oral Q breakfast  . sodium chloride flush  3 mL Intravenous Q12H  . tamsulosin  0.4 mg Oral Daily  . Warfarin - Pharmacist Dosing Inpatient   Does not apply q1800   Infusions:  . 0.9 % NaCl with KCl 20 mEq / L 75 mL/hr at 01/19/16 2019    Assessment: Pharmacy consulted to dose warfarin in a 75 yo female admitted with ARF with CKD3.  Patient  with paroxysmal atrial fibrillation requiring chronic warfarin therapy.  PTA dosing of warfarin 1 mg alternating every other day with 2 mg every other day.  Per PTA med list, last dose was 1 mg on 4/11. INR on admission of 4.06.  No warfarin received on 4/12.   4/13 INR 4.41  Warfarin HELD 4/14 INR 4.45  Warfarin HELD    Goal of Therapy:  INR 2-3 Monitor platelets by anticoagulation protocol: Yes   Plan:  INR supratherapeutic today at 4.41.  Will continue to hold warfarin and recheck INR in AM.  May consider decreasing dosing to warfarin 1 mg po daily once INR is <3.    4/14 Will continue to hold Warfarin.F/u INR in am  Pharmacy will continue to follow.   Troyce Gieske A 01/20/2016,8:17 AM

## 2016-01-20 NOTE — Care Management (Signed)
Patient admitted with ARF.  Patient states that she lives at home with her husband.  Son lives locally for support and transportation. Patient states that she has chronic O2 through [facility], CPAP, RW, and cane.  Son is to bring portable tank for discharge.  PT has worked with patient and recommends home health PT.  Patient has requested [facility].  Jason with [facility] given heads up referral.  Order will need to be placed at time of discharge.

## 2016-01-20 NOTE — Progress Notes (Addendum)
Initial Nutrition Assessment  DOCUMENTATION CODES:   Obesity unspecified  INTERVENTION:  -Monitor intake. Discussed examples and benefits of well balanced diet with pt. Pt verbalized understanding. -Recommend Ensure Enlive po BID, each supplement provides 350 kcal and 20 grams of protein    NUTRITION DIAGNOSIS:   Inadequate oral intake related to poor appetite as evidenced by per patient/family report.    GOAL:   Patient will meet greater than or equal to 90% of their needs    MONITOR:   PO intake, Supplement acceptance  REASON FOR ASSESSMENT:   Malnutrition Screening Tool    ASSESSMENT:   75 y/o female admitted with ARF, weakness, depression dtr recently past 2 weeks ago  Past Medical History  Diagnosis Date  . COPD (chronic obstructive pulmonary disease) (HCC)   . Hypertension   . Atrial fibrillation (HCC)   . CHF (congestive heart failure) (HCC)   . Chronic kidney disease   . Chronic respiratory failure (HCC)      Pt reports poor po intake for the past 2 weeks after the passing of her daughter. Reports intake is better ate muffin, drank juice and coffee this am. Has not received lunch yet.  Medications reviewed colace, aspart, KCL, prednisone, NS with KCL at 8050mkl/hr  Labs reviewed: Na 134, K 3.1, BUN 26, creatinine 1.76, calcium 8.5  Nutrition-Focused physical exam completed. Findings are no fat depletion, no muscle depletion, and no edema.     Diet Order:  Diet regular Room service appropriate?: Yes; Fluid consistency:: Thin  Skin:  Reviewed, no issues  Last BM:  4/13  Height:   Ht Readings from Last 1 Encounters:  01/18/16 5\' 3"  (1.6 m)    Weight: Pt reports UBW of 220-225 pounds, Did note wt in 10/02/15 236 pounds (4% wt loss in the last 4 months)  Wt Readings from Last 1 Encounters:  01/20/16 227 lb 4.8 oz (103.103 kg)    Ideal Body Weight:     BMI:  Body mass index is 40.27 kg/(m^2).  Estimated Nutritional Needs:   Kcal:   [id number] kcals/d  Protein:  103-123 g/d  Fluid:  2 L/d  EDUCATION NEEDS:   Education needs addressed  Jovonta Levit B. Freida BusmanAllen, RD, LDN [phone] (pager) Weekend/On-Call pager [phone])

## 2016-01-21 LAB — BASIC METABOLIC PANEL
Anion gap: 7 (ref 5–15)
BUN: 24 mg/dL — AB (ref 6–20)
CHLORIDE: 104 mmol/L (ref 101–111)
CO2: 23 mmol/L (ref 22–32)
CREATININE: 1.49 mg/dL — AB (ref 0.44–1.00)
Calcium: 8.5 mg/dL — ABNORMAL LOW (ref 8.9–10.3)
GFR calc Af Amer: 39 mL/min — ABNORMAL LOW (ref >60)
GFR calc non Af Amer: 33 mL/min — ABNORMAL LOW (ref >60)
Glucose, Bld: 96 mg/dL (ref 65–99)
POTASSIUM: 4.3 mmol/L (ref 3.5–5.1)
SODIUM: 134 mmol/L — AB (ref 135–145)

## 2016-01-21 LAB — GLUCOSE, CAPILLARY: GLUCOSE-CAPILLARY: 79 mg/dL (ref 65–99)

## 2016-01-21 LAB — PROTIME-INR
INR: 4
Prothrombin Time: 38 seconds — ABNORMAL HIGH (ref 11.4–15.0)

## 2016-01-21 MED ORDER — FUROSEMIDE 40 MG PO TABS: 40.0000 mg | ORAL_TABLET | ORAL | Status: AC

## 2016-01-21 MED ORDER — POTASSIUM CHLORIDE CRYS ER 20 MEQ PO TBCR: 20.0000 meq | EXTENDED_RELEASE_TABLET | ORAL | Status: DC

## 2016-01-21 MED ORDER — POTASSIUM CHLORIDE CRYS ER 20 MEQ PO TBCR: 20.0000 meq | EXTENDED_RELEASE_TABLET | ORAL | Status: AC

## 2016-01-21 NOTE — Progress Notes (Signed)
Called Dr. Tobi BastosPyreddy regarding continuing patient's continuous maintenance fluids.  Patient's potassium level was 3.1 on 4/14.  Doctor said to wait for results of am labs.  Arturo MortonClay, Penny Frisbie N  01/21/2016  4:23 AM

## 2016-01-21 NOTE — Discharge Summary (Addendum)
[facility] - [place] at [facility]   PATIENT NAME: Maureen Wilson    MR#:  [id number]  DATE OF BIRTH:  1940/11/10  DATE OF ADMISSION:  01/18/2016 ADMITTING PHYSICIAN: Milagros Loll, MD  DATE OF DISCHARGE: 4/  PRIMARY CARE PHYSICIAN: Joanna Hews, MD    ADMISSION DIAGNOSIS:  Generalized weakness [R53.1] History of COPD [Z87.09] Nausea without vomiting [R11.0] Acute renal failure, unspecified acute renal failure type (HCC) [N17.9]  DISCHARGE DIAGNOSIS:  Acute Renal failure due to overdiureses-improved Chronic respiratory failure due Chronic COPD on home oxygen 2 liter [place] Chronic afibrillation on warfarin (held due to elevated INR-4.0 at d/c) HTN CKD-III  SECONDARY DIAGNOSIS:   Past Medical History  Diagnosis Date  . COPD (chronic obstructive pulmonary disease) (HCC)   . Hypertension   . Atrial fibrillation (HCC)   . CHF (congestive heart failure) (HCC)   . Chronic kidney disease   . Chronic respiratory failure [facility])     HOSPITAL COURSE:    75 year old female with PAF, COPD and chronic respiratory failure who presented with weakness and found to have acute renal failure.  1. Acute renal failure: This is due to use of Lasix and metolazone with poor by mouth intake. Slowly improving with  Continue to hold nephrotoxic agents. Labs improved. Pt doing well Change lasix to 40 mg qod with K 20 meq qod  stop Metalazone at discharge  2. Hypovolemic hyponatremia: Sodium level has improved with IV fluids. -na is 134  3. Generalized weakness with hypotension: This is due to acute renal failure  Physical therapy recommends HHPT -received IV fluids  4.. Hypokalemia: Magnesium level is normal. This is likely due to diuretic use. repleted  5. PAF with RVR Medications held yesterday due to hypotension Continue to hold Coumadin. Pt will get INR checked on Monday with dr Cassie Freer and then resume coumdain per cardiology discretion depending on  labs Restarted cardiac meds  6. Depression with recent loss: Continue Lexapro, clonazepam Consulted psych. Appreciate input  7. Hyperlipidemia: Continue Lipitor  8. Hypothyroidism: Continue Synthroid TSH normal  Overall at baseline. D/c home with HHPT/RN CONSULTS OBTAINED:  Treatment Team:  Audery Amel, MD  DRUG ALLERGIES:   Allergies  Allergen Reactions  . Amlodipine Swelling and Other (See Comments)    Reaction:  Lip/mouth swelling   . Hydralazine Swelling and Other (See Comments)    Reaction:  Lip/mouth swelling   . Iohexol Hives and Other (See Comments)    Desc: Pt developed hives after receiving iv dye for ct scan.  suggest she be premedicated for future exams, Onset Date: [id number]   . Metoprolol Swelling and Other (See Comments)    Reaction:  Lip/mouth swelling   . Olmesartan Swelling and Other (See Comments)    Reaction:  Lip/mouth swelling   . Ramipril Swelling and Other (See Comments)    Reaction:  Lip/mouth swelling   . Sulfa Antibiotics Swelling and Other (See Comments)    Reaction:  Lip/mouth swelling   . Telmisartan Swelling and Other (See Comments)    Reaction:  Lip/mouth swelling   . Tiotropium Swelling and Other (See Comments)    Reaction:  Lip/mouth swelling     DISCHARGE MEDICATIONS:   Current Discharge Medication List    CONTINUE these medications which have CHANGED   Details  furosemide (LASIX) 40 MG tablet Take 1 tablet (40 mg total) by mouth every other day. Qty: 30 tablet, Refills: 0    potassium chloride SA (K-DUR,KLOR-CON) 20 MEQ  tablet Take 1 tablet (20 mEq total) by mouth every other day. Qty: 30 tablet, Refills: 0      CONTINUE these medications which have NOT CHANGED   Details  albuterol (PROVENTIL) (2.5 MG/3ML) 0.083% nebulizer solution Inhale 3 mLs into the lungs every 4 (four) hours as needed for wheezing or shortness of breath.     albuterol-ipratropium (COMBIVENT) 18-103 MCG/ACT inhaler Inhale 1-2 puffs into the lungs  every 4 (four) hours as needed for wheezing or shortness of breath.     aspirin EC 81 MG tablet Take 81 mg by mouth daily.    atenolol (TENORMIN) 25 MG tablet Take 25 mg by mouth 2 (two) times daily.    atorvastatin (LIPITOR) 20 MG tablet Take 20 mg by mouth at bedtime.     budesonide-formoterol (SYMBICORT) 160-4.5 MCG/ACT inhaler Inhale 2 puffs into the lungs 2 (two) times daily.    cetirizine (ZYRTEC) 10 MG tablet Take 10 mg by mouth daily.     cholecalciferol (VITAMIN D) 1000 units tablet Take 2,000 Units by mouth daily.    clonazePAM (KLONOPIN) 1 MG tablet Take 0.5-1 mg by mouth 3 (three) times daily. Pt takes one-half tablet in the morning, one-half tablet in the afternoon, and one tablet at bedtime.    diltiazem (DILACOR XR) 180 MG 24 hr capsule Take 180 mg by mouth daily.    escitalopram (LEXAPRO) 20 MG tablet Take 20 mg by mouth daily.    esomeprazole (NEXIUM) 20 MG capsule Take 20 mg by mouth 2 (two) times daily before a meal.    fluticasone (FLONASE) 50 MCG/ACT nasal spray Place 2 sprays into both nostrils daily as needed for rhinitis.     fluticasone furoate-vilanterol (BREO ELLIPTA) 100-25 MCG/INH AEPB Inhale 1 puff into the lungs daily.    !! levothyroxine (SYNTHROID, LEVOTHROID) 50 MCG tablet Take 50 mcg by mouth daily before breakfast. Pt takes this dose on Sunday, Tuesday, Wednesday, Friday, and Saturday.    !! levothyroxine (SYNTHROID, LEVOTHROID) 75 MCG tablet Take 75 mcg by mouth daily before breakfast. Pt takes this dose on Monday and Thursday.    montelukast (SINGULAIR) 10 MG tablet Take 10 mg by mouth at bedtime.    Multiple Vitamin (MULTIVITAMIN WITH MINERALS) TABS tablet Take 1 tablet by mouth daily.    predniSONE (DELTASONE) 5 MG tablet Take 5 mg by mouth daily with breakfast.    tamsulosin (FLOMAX) 0.4 MG CAPS capsule Take 0.4 mg by mouth daily.     docusate sodium (COLACE) 100 MG capsule Take 1 capsule (100 mg total) by mouth 2 (two) times daily. Qty:  10 capsule, Refills: 0    mupirocin ointment (BACTROBAN) 2 % Place 1 application into the nose 2 (two) times daily. Qty: 22 g, Refills: 0    senna-docusate (SENOKOT-S) 8.6-50 MG tablet Take 1 tablet by mouth at bedtime as needed for mild constipation. Qty: 30 tablet, Refills: 0     !! - Potential duplicate medications found. Please discuss with provider.    STOP taking these medications     metolazone (ZAROXOLYN) 2.5 MG tablet      warfarin (COUMADIN) 1 MG tablet      warfarin (COUMADIN) 2 MG tablet      amoxicillin-clavulanate (AUGMENTIN) 875-125 MG tablet         If you experience worsening of your admission symptoms, develop shortness of breath, life threatening emergency, suicidal or homicidal thoughts you must seek medical attention immediately by calling 911 or calling your MD immediately  if symptoms less severe.  You Must read complete instructions/literature along with all the possible adverse reactions/side effects for all the Medicines you take and that have been prescribed to you. Take any new Medicines after you have completely understood and accept all the possible adverse reactions/side effects.   Please note  You were cared for by a hospitalist during your hospital stay. If you have any questions about your discharge medications or the care you received while you were in the hospital after you are discharged, you can call the unit and asked to speak with the hospitalist on call if the hospitalist that took care of you is not available. Once you are discharged, your primary care physician will handle any further medical issues. Please note that NO REFILLS for any discharge medications will be authorized once you are discharged, as it is imperative that you return to your primary care physician (or establish a relationship with a primary care physician if you do not have one) for your aftercare needs so that they can reassess your need for medications and monitor your lab  values. Today   SUBJECTIVE  Feels  A lot better   VITAL SIGNS:  Blood pressure 118/71, pulse 106, temperature 97.6 F (36.4 C), temperature source Oral, resp. rate 16, height 5\' 3"  (1.6 m), weight 105.371 kg (232 lb 4.8 oz), SpO2 100 %.  I/O:    Intake/Output Summary (Last 24 hours) at 01/21/16 0923 Last data filed at 01/21/16 0735  Gross per 24 hour  Intake     77 ml  Output    600 ml  Net   -523 ml    PHYSICAL EXAMINATION:  GENERAL:  75 y.o.-year-old patient lying in the bed with no acute distress. obese EYES: Pupils equal, round, reactive to light and accommodation. No scleral icterus. Extraocular muscles intact.  HEENT: Head atraumatic, normocephalic. Oropharynx and nasopharynx clear.  NECK:  Supple, no jugular venous distention. No thyroid enlargement, no tenderness.  LUNGSdecreased breath sounds bilaterally, no wheezing, rales,rhonchi or crepitation. No use of accessory muscles of respiration.  CARDIOVASCULAR: S1, S2 normal. No murmurs, rubs, or gallops.  ABDOMEN: Soft, non-tender, non-distended. Bowel sounds present. No organomegaly or mass.  EXTREMITIES: No pedal edema, cyanosis, or clubbing.  NEUROLOGIC: Cranial nerves II through XII are intact. Muscle strength 5/5 in all extremities. Sensation intact. Gait not checked.  PSYCHIATRIC: The patient is alert and oriented x 3.  SKIN: No obvious rash, lesion, or ulcer.   DATA REVIEW:   CBC   Recent Labs Lab 01/19/16 0601  WBC 6.7  HGB 9.8*  HCT 29.3*  PLT 208    Chemistries   Recent Labs Lab 01/19/16 0601  01/21/16 0430  NA 134*  < > 134*  K 2.6*  < > 4.3  CL 93*  < > 104  CO2 31  < > 23  GLUCOSE 98  < > 96  BUN 29*  < > 24*  CREATININE 2.27*  < > 1.49*  CALCIUM 8.7*  < > 8.5*  MG 2.0  --   --   < > = values in this interval not displayed.  Microbiology Results   Recent Results (from the past 240 hour(s))  Urine culture     Status: None   Collection Time: 01/18/16  2:27 PM  Result Value Ref  Range Status   Specimen Description URINE, RANDOM  Final   Special Requests Normal  Final   Culture NO GROWTH 2 DAYS  Final   Report  Status 01/20/2016 FINAL  Final  MRSA PCR Screening     Status: Abnormal   Collection Time: 01/18/16 10:30 PM  Result Value Ref Range Status   MRSA by PCR POSITIVE (A) NEGATIVE Final    Comment:        The GeneXpert MRSA Assay (FDA approved for NASAL specimens only), is one component of a comprehensive MRSA colonization surveillance program. It is not intended to diagnose MRSA infection nor to guide or monitor treatment for MRSA infections. CRITICAL RESULT CALLED TO, READ BACK BY AND VERIFIED WITH: BY WHITNEY FISHER AT 0006 01/19/16 KLK     RADIOLOGY:  No results found.   Management plans discussed with the patient, family and they are in agreement.  CODE STATUS:     Code Status Orders        Start     Ordered   01/18/16 2103  Full code   Continuous     01/18/16 2102    Code Status History    Date Active Date Inactive Code Status Order ID Comments User Context   01/18/2016  9:01 PM 01/18/2016  9:02 PM DNR [id number]  Milagros LollSrikar Sudini, MD Inpatient   01/18/2016  3:49 PM 01/18/2016  9:01 PM Full Code [id number]  Milagros LollSrikar Sudini, MD ED   09/30/2015 12:46 AM 10/05/2015  7:51 PM Full Code [id number]  Ihor AustinPavan Pyreddy, MD Inpatient    Advance Directive Documentation        Most Recent Value   Type of Advance Directive  Healthcare Power of Attorney   Pre-existing out of facility DNR order (yellow form or pink MOST form)     "MOST" Form in Place?        TOTAL TIME TAKING CARE OF THIS PATIENT: 40 minutes.    Shaden Higley M.D on 01/21/2016 at 9:23 AM  Between 7am to 6pm - Pager - [phone] After 6pm go to [email or web address] - password [email or web address] [facility]  [facility]  Office  [phone]  CC: Primary care physician; Joanna HewsATE,ALLEN D, MD

## 2016-01-21 NOTE — Progress Notes (Signed)
Electrolyte Supplementation NOTE - Follow up  Pharmacy Consult for Hypokalemia   Allergies  Allergen Reactions  . Amlodipine Swelling and Other (See Comments)    Reaction:  Lip/mouth swelling   . Hydralazine Swelling and Other (See Comments)    Reaction:  Lip/mouth swelling   . Iohexol Hives and Other (See Comments)    Desc: Pt developed hives after receiving iv dye for ct scan.  suggest she be premedicated for future exams, Onset Date: [id number]   . Metoprolol Swelling and Other (See Comments)    Reaction:  Lip/mouth swelling   . Olmesartan Swelling and Other (See Comments)    Reaction:  Lip/mouth swelling   . Ramipril Swelling and Other (See Comments)    Reaction:  Lip/mouth swelling   . Sulfa Antibiotics Swelling and Other (See Comments)    Reaction:  Lip/mouth swelling   . Telmisartan Swelling and Other (See Comments)    Reaction:  Lip/mouth swelling   . Tiotropium Swelling and Other (See Comments)    Reaction:  Lip/mouth swelling     Patient Measurements: Height: 5\' 3"  (160 cm) Weight: 232 lb 4.8 oz (105.371 kg) IBW/kg (Calculated) : 52.4   Vital Signs: Temp: 97.6 F (36.4 C) (04/15 0600) Temp Source: Oral (04/15 0600) BP: 118/71 mmHg (04/15 0600) Pulse Rate: 106 (04/15 0600) Intake/Output from previous day: 04/14 0701 - 04/15 0700 In: 77 [I.V.:77] Out: 950 [Urine:950] Intake/Output from this shift:    Labs:  Recent Labs  01/18/16 1346 01/19/16 0601 01/20/16 0450 01/21/16 0430  WBC 7.9 6.7  --   --   HGB 10.5* 9.8*  --   --   HCT 32.0* 29.3*  --   --   PLT 233 208  --   --   APTT 41*  --   --   --   CREATININE 2.73* 2.27* 1.76* 1.49*  MG 2.1 2.0  --   --    Lab Results  Component Value Date   K 4.3 01/21/2016    Estimated Creatinine Clearance: 38.5 mL/min (by C-G formula based on Cr of 1.49).   Medical History: Past Medical History  Diagnosis Date  . COPD (chronic obstructive pulmonary disease) (HCC)   . Hypertension   . Atrial  fibrillation (HCC)   . CHF (congestive heart failure) (HCC)   . Chronic kidney disease   . Chronic respiratory failure (HCC)     Medications:  Scheduled:  . aspirin EC  81 mg Oral Daily  . atorvastatin  20 mg Oral QHS  . Chlorhexidine Gluconate Cloth  6 each Topical Q0600  . diltiazem  120 mg Oral Daily  . docusate sodium  100 mg Oral BID  . escitalopram  20 mg Oral Daily  . feeding supplement (ENSURE ENLIVE)  237 mL Oral BID BM  . fluticasone furoate-vilanterol  1 puff Inhalation Daily  . insulin aspart  0-9 Units Subcutaneous TID WC  . levothyroxine  50 mcg Oral Once per day on Sun Tue Wed Fri Sat  . levothyroxine  75 mcg Oral Once per day on Mon Thu  . loratadine  10 mg Oral Daily  . mometasone-formoterol  2 puff Inhalation BID  . montelukast  10 mg Oral QHS  . mupirocin ointment  1 application Nasal BID  . pantoprazole  40 mg Oral BID AC  . predniSONE  5 mg Oral Q breakfast  . sodium chloride flush  3 mL Intravenous Q12H  . tamsulosin  0.4 mg Oral Daily  . Warfarin -  Pharmacist Dosing Inpatient   Does not apply q1800      Assessment: Patient is a 75 yo female admitted for ARF on top of CKD3.  Per PTA med list, patient takes KCl 40 meq po qAM and 20 meq po qPM.  Goal of Therapy:  K: 3.5-5.1 Mag: 1.7-2.4  Plan: K= 4.3. No supplementation is needed at this time.   Will recheck electrolytes with am labs.   Pharmacy will continue to monitor and adjust per consult.    Stormy Card, [facility] Clinical Pharmacist 01/21/2016,7:19 AM

## 2016-01-21 NOTE — Discharge Instructions (Signed)
Patient advised to see Dr Cassie FreerParachos on Monday (scheduled appt)  She  Will get her PT INR checked on Monday and resumption dose to be determined by Dr Cassie FreerParachos HHPT and RN

## 2016-01-21 NOTE — Progress Notes (Signed)
ANTICOAGULATION CONSULT NOTE -Follow up Consult  Pharmacy Consult for warfarin dosing Indication: atrial fibrillation  Allergies  Allergen Reactions  . Amlodipine Swelling and Other (See Comments)    Reaction:  Lip/mouth swelling   . Hydralazine Swelling and Other (See Comments)    Reaction:  Lip/mouth swelling   . Iohexol Hives and Other (See Comments)    Desc: Pt developed hives after receiving iv dye for ct scan.  suggest she be premedicated for future exams, Onset Date: [id number]   . Metoprolol Swelling and Other (See Comments)    Reaction:  Lip/mouth swelling   . Olmesartan Swelling and Other (See Comments)    Reaction:  Lip/mouth swelling   . Ramipril Swelling and Other (See Comments)    Reaction:  Lip/mouth swelling   . Sulfa Antibiotics Swelling and Other (See Comments)    Reaction:  Lip/mouth swelling   . Telmisartan Swelling and Other (See Comments)    Reaction:  Lip/mouth swelling   . Tiotropium Swelling and Other (See Comments)    Reaction:  Lip/mouth swelling     Patient Measurements: Height: 5\' 3"  (160 cm) Weight: 232 lb 4.8 oz (105.371 kg) IBW/kg (Calculated) : 52.4   Vital Signs: Temp: 97.6 F (36.4 C) (04/15 0600) Temp Source: Oral (04/15 0600) BP: 118/71 mmHg (04/15 0600) Pulse Rate: 106 (04/15 0600)  Labs:  Recent Labs  01/18/16 1346 01/19/16 0601 01/20/16 0450 01/21/16 0430  HGB 10.5* 9.8*  --   --   HCT 32.0* 29.3*  --   --   PLT 233 208  --   --   APTT 41*  --   --   --   LABPROT 38.2* 40.9* 41.2* 38.0*  INR 4.06* 4.41* 4.45* 4.00  CREATININE 2.73* 2.27* 1.76* 1.49*  TROPONINI 0.03  --   --   --     Estimated Creatinine Clearance: 38.5 mL/min (by C-G formula based on Cr of 1.49).   Medical History: Past Medical History  Diagnosis Date  . COPD (chronic obstructive pulmonary disease) (HCC)   . Hypertension   . Atrial fibrillation (HCC)   . CHF (congestive heart failure) (HCC)   . Chronic kidney disease   . Chronic respiratory  failure (HCC)     Medications:  Scheduled:  . aspirin EC  81 mg Oral Daily  . atorvastatin  20 mg Oral QHS  . Chlorhexidine Gluconate Cloth  6 each Topical Q0600  . diltiazem  120 mg Oral Daily  . docusate sodium  100 mg Oral BID  . escitalopram  20 mg Oral Daily  . feeding supplement (ENSURE ENLIVE)  237 mL Oral BID BM  . fluticasone furoate-vilanterol  1 puff Inhalation Daily  . insulin aspart  0-9 Units Subcutaneous TID WC  . levothyroxine  50 mcg Oral Once per day on Sun Tue Wed Fri Sat  . levothyroxine  75 mcg Oral Once per day on Mon Thu  . loratadine  10 mg Oral Daily  . mometasone-formoterol  2 puff Inhalation BID  . montelukast  10 mg Oral QHS  . mupirocin ointment  1 application Nasal BID  . pantoprazole  40 mg Oral BID AC  . predniSONE  5 mg Oral Q breakfast  . sodium chloride flush  3 mL Intravenous Q12H  . tamsulosin  0.4 mg Oral Daily  . Warfarin - Pharmacist Dosing Inpatient   Does not apply q1800   Infusions:     Assessment: Pharmacy consulted to dose warfarin in a 75 yo  female admitted with ARF with CKD3.  Patient with paroxysmal atrial fibrillation requiring chronic warfarin therapy.  PTA dosing of warfarin 1 mg alternating every other day with 2 mg every other day.  Per PTA med list, last dose was 1 mg on 4/11. INR on admission of 4.06.  No warfarin received on 4/12.   4/13 INR 4.41  Warfarin HELD 4/14 INR 4.45  Warfarin HELD 4/15 INR 4.0    Warfarin HELD    Goal of Therapy:  INR 2-3 Monitor platelets by anticoagulation protocol: Yes   Plan:  INR supratherapeutic today at 4.41.  Will continue to hold warfarin and recheck INR in AM.  May consider decreasing dosing to warfarin 1 mg po daily once INR is <3.    4/15 Will continue to hold Warfarin.  Follow up INR in am.  Pharmacy will continue to follow.   Stormy Card, [facility] Clinical Pharmacist  01/21/2016,7:17 AM

## 2016-01-21 NOTE — Care Management Note (Signed)
Case Management Note  Patient Details  Name: Berle Mullatricia King Rudden MRN: [id number] Date of Birth: 1941/02/23  Subjective/Objective:      Referral faxed to [facility] requesting home health RN and PT. Note to HH-RN given to please make sure that Ms Lillia MountainWilkins INR is drawn on Monday at Dr Darrold JunkerParaschos office and he will decide whether or not to resume coumadin.               Action/Plan:   Expected Discharge Date:                  Expected Discharge Plan:     In-House Referral:     Discharge planning Services     Post Acute Care Choice:    Choice offered to:     DME Arranged:    DME Agency:     HH Arranged:    HH Agency:     Status of Service:     Medicare Important Message Given:  Yes Date Medicare IM Given:    Medicare IM give by:    Date Additional Medicare IM Given:    Additional Medicare Important Message give by:     If discussed at Long Length of Stay Meetings, dates discussed:    Additional Comments:  Marletta Bousquet A, RN 01/21/2016, 9:28 AM

## 2017-01-16 ENCOUNTER — Inpatient Hospital Stay (HOSPITAL_COMMUNITY): Payer: Medicare Other

## 2017-01-16 ENCOUNTER — Inpatient Hospital Stay (HOSPITAL_COMMUNITY)
Admission: EM | Admit: 2017-01-16 | Discharge: 2017-01-21 | DRG: 467 | Disposition: A | Discharge status: Skilled Nursing Facility | Payer: Medicare Other | Attending: Internal Medicine | Admitting: Internal Medicine

## 2017-01-16 ENCOUNTER — Other Ambulatory Visit: Payer: Self-pay

## 2017-01-16 ENCOUNTER — Emergency Department (HOSPITAL_COMMUNITY): Payer: Medicare Other

## 2017-01-16 ENCOUNTER — Encounter (HOSPITAL_COMMUNITY): Payer: Self-pay

## 2017-01-16 DIAGNOSIS — E876 Hypokalemia: Secondary | ICD-10-CM

## 2017-01-16 DIAGNOSIS — N183 Chronic kidney disease, stage 3 (moderate): Secondary | ICD-10-CM | POA: Diagnosis present

## 2017-01-16 DIAGNOSIS — Z7982 Long term (current) use of aspirin: Secondary | ICD-10-CM

## 2017-01-16 DIAGNOSIS — Z79899 Other long term (current) drug therapy: Secondary | ICD-10-CM

## 2017-01-16 DIAGNOSIS — Y792 Prosthetic and other implants, materials and accessory orthopedic devices associated with adverse incidents: Secondary | ICD-10-CM | POA: Diagnosis present

## 2017-01-16 DIAGNOSIS — Z7901 Long term (current) use of anticoagulants: Secondary | ICD-10-CM

## 2017-01-16 DIAGNOSIS — I5032 Chronic diastolic (congestive) heart failure: Secondary | ICD-10-CM | POA: Diagnosis present

## 2017-01-16 DIAGNOSIS — E860 Dehydration: Secondary | ICD-10-CM | POA: Diagnosis present

## 2017-01-16 DIAGNOSIS — I130 Hypertensive heart and chronic kidney disease with heart failure and stage 1 through stage 4 chronic kidney disease, or unspecified chronic kidney disease: Secondary | ICD-10-CM | POA: Diagnosis present

## 2017-01-16 DIAGNOSIS — I4891 Unspecified atrial fibrillation: Secondary | ICD-10-CM

## 2017-01-16 DIAGNOSIS — Z6841 Body Mass Index (BMI) 40.0 and over, adult: Secondary | ICD-10-CM | POA: Diagnosis not present

## 2017-01-16 DIAGNOSIS — Z9981 Dependence on supplemental oxygen: Secondary | ICD-10-CM

## 2017-01-16 DIAGNOSIS — T84021A Dislocation of internal left hip prosthesis, initial encounter: Secondary | ICD-10-CM | POA: Diagnosis present

## 2017-01-16 DIAGNOSIS — E039 Hypothyroidism, unspecified: Secondary | ICD-10-CM | POA: Diagnosis present

## 2017-01-16 DIAGNOSIS — T84091A Other mechanical complication of internal left hip prosthesis, initial encounter: Secondary | ICD-10-CM | POA: Diagnosis present

## 2017-01-16 DIAGNOSIS — T84028A Dislocation of other internal joint prosthesis, initial encounter: Secondary | ICD-10-CM

## 2017-01-16 DIAGNOSIS — J439 Emphysema, unspecified: Secondary | ICD-10-CM | POA: Diagnosis present

## 2017-01-16 DIAGNOSIS — F419 Anxiety disorder, unspecified: Secondary | ICD-10-CM | POA: Diagnosis present

## 2017-01-16 DIAGNOSIS — D72829 Elevated white blood cell count, unspecified: Secondary | ICD-10-CM | POA: Diagnosis present

## 2017-01-16 DIAGNOSIS — J449 Chronic obstructive pulmonary disease, unspecified: Secondary | ICD-10-CM | POA: Diagnosis not present

## 2017-01-16 DIAGNOSIS — W19XXXA Unspecified fall, initial encounter: Secondary | ICD-10-CM | POA: Diagnosis not present

## 2017-01-16 DIAGNOSIS — M25552 Pain in left hip: Secondary | ICD-10-CM | POA: Diagnosis present

## 2017-01-16 DIAGNOSIS — S73005A Unspecified dislocation of left hip, initial encounter: Secondary | ICD-10-CM | POA: Diagnosis present

## 2017-01-16 DIAGNOSIS — J9610 Chronic respiratory failure, unspecified whether with hypoxia or hypercapnia: Secondary | ICD-10-CM | POA: Diagnosis present

## 2017-01-16 DIAGNOSIS — I509 Heart failure, unspecified: Secondary | ICD-10-CM

## 2017-01-16 DIAGNOSIS — I480 Paroxysmal atrial fibrillation: Secondary | ICD-10-CM

## 2017-01-16 DIAGNOSIS — Z66 Do not resuscitate: Secondary | ICD-10-CM | POA: Diagnosis present

## 2017-01-16 DIAGNOSIS — Z01811 Encounter for preprocedural respiratory examination: Secondary | ICD-10-CM

## 2017-01-16 DIAGNOSIS — Z96649 Presence of unspecified artificial hip joint: Secondary | ICD-10-CM

## 2017-01-16 DIAGNOSIS — I482 Chronic atrial fibrillation, unspecified: Secondary | ICD-10-CM

## 2017-01-16 DIAGNOSIS — I5021 Acute systolic (congestive) heart failure: Secondary | ICD-10-CM | POA: Diagnosis not present

## 2017-01-16 DIAGNOSIS — Z5181 Encounter for therapeutic drug level monitoring: Secondary | ICD-10-CM | POA: Diagnosis not present

## 2017-01-16 DIAGNOSIS — Z09 Encounter for follow-up examination after completed treatment for conditions other than malignant neoplasm: Secondary | ICD-10-CM

## 2017-01-16 HISTORY — DX: Unspecified asthma, uncomplicated: J45.909

## 2017-01-16 HISTORY — DX: Emphysema, unspecified: J43.9

## 2017-01-16 HISTORY — DX: Dorsalgia, unspecified: M54.9

## 2017-01-16 HISTORY — DX: Bronchitis, not specified as acute or chronic: J40

## 2017-01-16 LAB — BASIC METABOLIC PANEL
ANION GAP: 12 (ref 5–15)
BUN: 32 mg/dL — ABNORMAL HIGH (ref 6–20)
CHLORIDE: 90 mmol/L — AB (ref 101–111)
CO2: 33 mmol/L — ABNORMAL HIGH (ref 22–32)
Calcium: 9.3 mg/dL (ref 8.9–10.3)
Creatinine, Ser: 1.59 mg/dL — ABNORMAL HIGH (ref 0.44–1.00)
GFR, EST AFRICAN AMERICAN: 36 mL/min — AB (ref >60)
GFR, EST NON AFRICAN AMERICAN: 31 mL/min — AB (ref >60)
Glucose, Bld: 113 mg/dL — ABNORMAL HIGH (ref 65–99)
POTASSIUM: 2.8 mmol/L — AB (ref 3.5–5.1)
SODIUM: 135 mmol/L (ref 135–145)

## 2017-01-16 LAB — CBC WITH DIFFERENTIAL/PLATELET
BASOS ABS: 0 10*3/uL (ref 0.0–0.1)
Basophils Relative: 0 %
Eosinophils Absolute: 0.1 10*3/uL (ref 0.0–0.7)
Eosinophils Relative: 1 %
HCT: 36.1 % (ref 36.0–46.0)
Hemoglobin: 11.8 g/dL — ABNORMAL LOW (ref 12.0–15.0)
LYMPHS ABS: 1.6 10*3/uL (ref 0.7–4.0)
LYMPHS PCT: 12 %
MCH: 30 pg (ref 26.0–34.0)
MCHC: 32.7 g/dL (ref 30.0–36.0)
MCV: 91.9 fL (ref 78.0–100.0)
Monocytes Absolute: 1 10*3/uL (ref 0.1–1.0)
Monocytes Relative: 8 %
NEUTROS ABS: 10.4 10*3/uL — AB (ref 1.7–7.7)
NEUTROS PCT: 79 %
PLATELETS: 212 10*3/uL (ref 150–400)
RBC: 3.93 MIL/uL (ref 3.87–5.11)
RDW: 14.8 % (ref 11.5–15.5)
WBC: 13.1 10*3/uL — AB (ref 4.0–10.5)

## 2017-01-16 LAB — PROTIME-INR
INR: 1.71
Prothrombin Time: 20.3 seconds — ABNORMAL HIGH (ref 11.4–15.2)

## 2017-01-16 MED ORDER — SENNA 8.6 MG PO TABS
1.0000 | ORAL_TABLET | Freq: Two times a day (BID) | ORAL | Status: DC
  Administered 2017-01-16 – 2017-01-21 (×8): 8.6 mg via ORAL
  Filled 2017-01-16 (×8): qty 1

## 2017-01-16 MED ORDER — LEVOTHYROXINE SODIUM 50 MCG PO TABS
50.0000 ug | ORAL_TABLET | ORAL | Status: DC
  Administered 2017-01-18 – 2017-01-20 (×3): 50 ug via ORAL
  Filled 2017-01-16 (×3): qty 1

## 2017-01-16 MED ORDER — ACETAMINOPHEN 325 MG PO TABS: 650.0000 mg | ORAL_TABLET | Freq: Four times a day (QID) | ORAL | Status: DC | PRN

## 2017-01-16 MED ORDER — CLONAZEPAM 1 MG PO TABS
1.0000 mg | ORAL_TABLET | Freq: Every evening | ORAL | Status: DC | PRN
  Administered 2017-01-16: 1 mg via ORAL
  Filled 2017-01-16: qty 1

## 2017-01-16 MED ORDER — PANTOPRAZOLE SODIUM 40 MG PO TBEC
40.0000 mg | DELAYED_RELEASE_TABLET | Freq: Every day | ORAL | Status: DC
  Administered 2017-01-16 – 2017-01-18 (×3): 40 mg via ORAL
  Filled 2017-01-16 (×3): qty 1

## 2017-01-16 MED ORDER — SODIUM CHLORIDE 0.9% FLUSH
3.0000 mL | Freq: Two times a day (BID) | INTRAVENOUS | Status: DC
  Administered 2017-01-18 – 2017-01-21 (×5): 3 mL via INTRAVENOUS

## 2017-01-16 MED ORDER — ACETAMINOPHEN 650 MG RE SUPP: 650.0000 mg | Freq: Four times a day (QID) | RECTAL | Status: DC | PRN

## 2017-01-16 MED ORDER — FLUTICASONE PROPIONATE 50 MCG/ACT NA SUSP
2.0000 | Freq: Every day | NASAL | Status: DC | PRN
  Filled 2017-01-16: qty 16

## 2017-01-16 MED ORDER — CLONAZEPAM 0.5 MG PO TABS: 0.5000 mg | ORAL_TABLET | Freq: Two times a day (BID) | ORAL | Status: DC | PRN

## 2017-01-16 MED ORDER — TAMSULOSIN HCL 0.4 MG PO CAPS
0.4000 mg | ORAL_CAPSULE | Freq: Every day | ORAL | Status: DC
  Administered 2017-01-17 – 2017-01-21 (×5): 0.4 mg via ORAL
  Filled 2017-01-16 (×5): qty 1

## 2017-01-16 MED ORDER — ADULT MULTIVITAMIN W/MINERALS CH
1.0000 | ORAL_TABLET | Freq: Every day | ORAL | Status: DC
  Administered 2017-01-16 – 2017-01-21 (×6): 1 via ORAL
  Filled 2017-01-16 (×6): qty 1

## 2017-01-16 MED ORDER — FLUTICASONE FUROATE-VILANTEROL 100-25 MCG/INH IN AEPB
1.0000 | INHALATION_SPRAY | Freq: Every day | RESPIRATORY_TRACT | Status: DC
  Administered 2017-01-16 – 2017-01-20 (×4): 1 via RESPIRATORY_TRACT
  Filled 2017-01-16: qty 28

## 2017-01-16 MED ORDER — ATORVASTATIN CALCIUM 10 MG PO TABS
20.0000 mg | ORAL_TABLET | Freq: Every day | ORAL | Status: DC
  Administered 2017-01-16 – 2017-01-20 (×4): 20 mg via ORAL
  Filled 2017-01-16 (×4): qty 2

## 2017-01-16 MED ORDER — VANCOMYCIN HCL 10 G IV SOLR
1500.0000 mg | INTRAVENOUS | Status: AC
  Administered 2017-01-17: 1000 mg via INTRAVENOUS
  Filled 2017-01-16: qty 1500

## 2017-01-16 MED ORDER — SODIUM CHLORIDE 0.9% FLUSH: 3.0000 mL | INTRAVENOUS | Status: DC | PRN

## 2017-01-16 MED ORDER — POVIDONE-IODINE 10 % EX SWAB
2.0000 "application " | Freq: Once | CUTANEOUS | Status: AC
  Administered 2017-01-17: 2 via TOPICAL

## 2017-01-16 MED ORDER — CHLORHEXIDINE GLUCONATE 4 % EX LIQD
60.0000 mL | Freq: Once | CUTANEOUS | Status: AC
  Administered 2017-01-17: 4 via TOPICAL
  Filled 2017-01-16 (×2): qty 60

## 2017-01-16 MED ORDER — ATENOLOL 25 MG PO TABS
25.0000 mg | ORAL_TABLET | Freq: Every day | ORAL | Status: DC
  Administered 2017-01-16 – 2017-01-21 (×5): 25 mg via ORAL
  Filled 2017-01-16 (×6): qty 1

## 2017-01-16 MED ORDER — POTASSIUM CHLORIDE 10 MEQ/100ML IV SOLN
10.0000 meq | INTRAVENOUS | Status: AC
  Administered 2017-01-16 (×2): 10 meq via INTRAVENOUS
  Filled 2017-01-16 (×2): qty 100

## 2017-01-16 MED ORDER — IPRATROPIUM-ALBUTEROL 0.5-2.5 (3) MG/3ML IN SOLN
3.0000 mL | RESPIRATORY_TRACT | Status: DC | PRN
  Administered 2017-01-19: 3 mL via RESPIRATORY_TRACT
  Filled 2017-01-16: qty 3

## 2017-01-16 MED ORDER — VITAMIN D3 25 MCG (1000 UNIT) PO TABS
2000.0000 [IU] | ORAL_TABLET | Freq: Every day | ORAL | Status: DC
  Administered 2017-01-16 – 2017-01-21 (×6): 2000 [IU] via ORAL
  Filled 2017-01-16 (×6): qty 2

## 2017-01-16 MED ORDER — ESCITALOPRAM OXALATE 20 MG PO TABS
20.0000 mg | ORAL_TABLET | Freq: Every day | ORAL | Status: DC
  Administered 2017-01-17 – 2017-01-21 (×5): 20 mg via ORAL
  Filled 2017-01-16 (×5): qty 1

## 2017-01-16 MED ORDER — ALBUTEROL SULFATE (2.5 MG/3ML) 0.083% IN NEBU
3.0000 mL | INHALATION_SOLUTION | RESPIRATORY_TRACT | Status: DC | PRN
  Administered 2017-01-20 (×2): 3 mL via RESPIRATORY_TRACT
  Filled 2017-01-16 (×3): qty 3

## 2017-01-16 MED ORDER — FENTANYL CITRATE (PF) 100 MCG/2ML IJ SOLN
50.0000 ug | Freq: Once | INTRAMUSCULAR | Status: AC
  Administered 2017-01-16: 50 ug via INTRAVENOUS
  Filled 2017-01-16: qty 2

## 2017-01-16 MED ORDER — ACETAMINOPHEN 10 MG/ML IV SOLN
1000.0000 mg | INTRAVENOUS | Status: AC
  Administered 2017-01-17: 1000 mg via INTRAVENOUS
  Filled 2017-01-16: qty 100

## 2017-01-16 MED ORDER — ONDANSETRON HCL 4 MG PO TABS
4.0000 mg | ORAL_TABLET | Freq: Four times a day (QID) | ORAL | Status: DC | PRN
  Administered 2017-01-16: 4 mg via ORAL
  Filled 2017-01-16 (×2): qty 1

## 2017-01-16 MED ORDER — PREDNISONE 5 MG PO TABS
5.0000 mg | ORAL_TABLET | Freq: Every day | ORAL | Status: DC
  Administered 2017-01-17 – 2017-01-20 (×4): 5 mg via ORAL
  Filled 2017-01-16 (×5): qty 1

## 2017-01-16 MED ORDER — FENTANYL CITRATE (PF) 100 MCG/2ML IJ SOLN
100.0000 ug | Freq: Once | INTRAMUSCULAR | Status: AC
  Administered 2017-01-16: 100 ug via INTRAVENOUS
  Filled 2017-01-16: qty 2

## 2017-01-16 MED ORDER — PHYTONADIONE 5 MG PO TABS
5.0000 mg | ORAL_TABLET | Freq: Once | ORAL | Status: AC
  Administered 2017-01-16: 5 mg via ORAL
  Filled 2017-01-16: qty 1

## 2017-01-16 MED ORDER — POTASSIUM CHLORIDE 10 MEQ/100ML IV SOLN
10.0000 meq | INTRAVENOUS | Status: AC
  Administered 2017-01-16: 10 meq via INTRAVENOUS
  Filled 2017-01-16 (×3): qty 100

## 2017-01-16 MED ORDER — LEVOTHYROXINE SODIUM 50 MCG PO TABS
75.0000 ug | ORAL_TABLET | ORAL | Status: DC
  Administered 2017-01-17 – 2017-01-21 (×2): 75 ug via ORAL
  Filled 2017-01-16 (×2): qty 1

## 2017-01-16 MED ORDER — HYDROCODONE-ACETAMINOPHEN 5-325 MG PO TABS
1.0000 | ORAL_TABLET | ORAL | Status: DC | PRN
  Administered 2017-01-16: 2 via ORAL
  Administered 2017-01-17: 1 via ORAL
  Administered 2017-01-17: 2 via ORAL
  Administered 2017-01-18: 1 via ORAL
  Filled 2017-01-16: qty 1
  Filled 2017-01-16 (×3): qty 2

## 2017-01-16 MED ORDER — CLONAZEPAM 0.5 MG PO TABS: 0.5000 mg | ORAL_TABLET | Freq: Every day | ORAL | Status: DC | PRN

## 2017-01-16 MED ORDER — DOCUSATE SODIUM 100 MG PO CAPS
100.0000 mg | ORAL_CAPSULE | Freq: Two times a day (BID) | ORAL | Status: DC
  Administered 2017-01-16 – 2017-01-21 (×9): 100 mg via ORAL
  Filled 2017-01-16 (×9): qty 1

## 2017-01-16 MED ORDER — IPRATROPIUM-ALBUTEROL 18-103 MCG/ACT IN AERO: 1.0000 | INHALATION_SPRAY | RESPIRATORY_TRACT | Status: DC | PRN

## 2017-01-16 MED ORDER — MONTELUKAST SODIUM 10 MG PO TABS
10.0000 mg | ORAL_TABLET | Freq: Every day | ORAL | Status: DC
  Administered 2017-01-16 – 2017-01-20 (×4): 10 mg via ORAL
  Filled 2017-01-16 (×4): qty 1

## 2017-01-16 MED ORDER — CEFAZOLIN SODIUM-DEXTROSE 2-4 GM/100ML-% IV SOLN
2.0000 g | INTRAVENOUS | Status: AC
  Administered 2017-01-17: 2 g via INTRAVENOUS
  Filled 2017-01-16: qty 100

## 2017-01-16 MED ORDER — SODIUM CHLORIDE 0.9 % IV SOLN: 30.0000 meq | Freq: Once | INTRAVENOUS | Status: DC

## 2017-01-16 MED ORDER — DILTIAZEM HCL ER COATED BEADS 180 MG PO CP24
180.0000 mg | ORAL_CAPSULE | Freq: Every day | ORAL | Status: DC
  Administered 2017-01-16 – 2017-01-21 (×6): 180 mg via ORAL
  Filled 2017-01-16 (×6): qty 1

## 2017-01-16 MED ORDER — SODIUM CHLORIDE 0.9 % IV SOLN: 250.0000 mL | INTRAVENOUS | Status: DC | PRN

## 2017-01-16 MED ORDER — TRANEXAMIC ACID 1000 MG/10ML IV SOLN
1000.0000 mg | INTRAVENOUS | Status: AC
  Administered 2017-01-17: 1000 mg via INTRAVENOUS
  Filled 2017-01-16: qty 10
  Filled 2017-01-16: qty 1100

## 2017-01-16 MED ORDER — TRANEXAMIC ACID 1000 MG/10ML IV SOLN
1000.0000 mg | INTRAVENOUS | Status: DC
  Filled 2017-01-16: qty 10

## 2017-01-16 MED ORDER — ONDANSETRON HCL 4 MG/2ML IJ SOLN
4.0000 mg | Freq: Four times a day (QID) | INTRAMUSCULAR | Status: DC | PRN
  Administered 2017-01-17: 4 mg via INTRAVENOUS

## 2017-01-16 MED ORDER — ASPIRIN EC 81 MG PO TBEC
81.0000 mg | DELAYED_RELEASE_TABLET | Freq: Every day | ORAL | Status: DC
  Administered 2017-01-18 – 2017-01-21 (×4): 81 mg via ORAL
  Filled 2017-01-16 (×4): qty 1

## 2017-01-16 NOTE — ED Notes (Signed)
2 failed attempts to collect labs 

## 2017-01-16 NOTE — ED Notes (Signed)
Attempted to drawn blood for type and screen but no success.

## 2017-01-16 NOTE — H&P (Signed)
History and Physical    Maureen Wilson:[id number] DOB: Jan 20, 1941 DOA: 01/16/2017  PCP: Joanna Hews, MD  Patient coming from: Home   I have personally briefly reviewed patient's old medical records in [facility]  Chief Complaint: left hip pain   HPI: Maureen Wilson is a 76 y.o. female with medical history significant of PAF on coumadin, COPD on 2 L oxygen, CKD stage III, cr baseline 1.5, who presents complaining of left hip pain. Patient was walking with her walker when she felt a pop in her hip and fell down on her left hip. She denies chest pain, dyspnea, lightheaded prior to episode. She is having left hip pain, worse with movement. She is able to walk with her walker at home. Able to walk only 25 feet without dyspnea. She uses 2 L of oxygen.  She currently denies chest pain or dyspnea.    ED Course: WBC at 13, cr a.59, BUN 32, Co 2 33; k at 2.8. Left hip X ray: Left hip arthroplasty with anterior dislocation of the femoral head component relative to the acetabular cup. Chest x ray; cardiomegaly without failure.   Review of Systems: As per HPI otherwise 10 point review of systems negative.    Past Medical History:  Diagnosis Date  . Asthma   . Atrial fibrillation (HCC)   . Back pain   . Bronchitis    hx of  . CHF (congestive heart failure) (HCC)   . Chronic kidney disease   . Chronic respiratory failure (HCC)   . COPD (chronic obstructive pulmonary disease) (HCC)   . Emphysema lung (HCC)   . Hypertension     Past Surgical History:  Procedure Laterality Date  . ABDOMINAL HYSTERECTOMY    . PARTIAL HIP ARTHROPLASTY Right   . TOTAL HIP ARTHROPLASTY Left      reports that she has never smoked. She has never used smokeless tobacco. She reports that she does not drink alcohol or use drugs.  Allergies  Allergen Reactions  . Amlodipine Swelling and Other (See Comments)    Reaction:  Lip/mouth swelling   . Hydralazine Swelling and Other (See Comments)     Reaction:  Lip/mouth swelling   . Iohexol Hives and Other (See Comments)    Desc: Pt developed hives after receiving iv dye for ct scan.  suggest she be premedicated for future exams, Onset Date: [id number]   . Metoprolol Swelling and Other (See Comments)    Reaction:  Lip/mouth swelling   . Olmesartan Swelling and Other (See Comments)    Reaction:  Lip/mouth swelling   . Ramipril Swelling and Other (See Comments)    Reaction:  Lip/mouth swelling   . Sulfa Antibiotics Swelling and Other (See Comments)    Reaction:  Lip/mouth swelling   . Telmisartan Swelling and Other (See Comments)    Reaction:  Lip/mouth swelling   . Tiotropium Swelling and Other (See Comments)    Reaction:  Lip/mouth swelling     Family History  Problem Relation Age of Onset  . Hypertension Son      Prior to Admission medications   Medication Sig Start Date End Date Taking? Authorizing Provider  albuterol (PROVENTIL) (2.5 MG/3ML) 0.083% nebulizer solution Inhale 3 mLs into the lungs every 4 (four) hours as needed for wheezing or shortness of breath.    Yes Historical Provider, MD  albuterol-ipratropium (COMBIVENT) 18-103 MCG/ACT inhaler Inhale 1-2 puffs into the lungs every 4 (four) hours as needed for wheezing or shortness  of breath.    Yes Historical Provider, MD  aspirin EC 81 MG tablet Take 81 mg by mouth daily.   Yes Historical Provider, MD  atenolol (TENORMIN) 25 MG tablet Take 25 mg by mouth daily.    Yes Historical Provider, MD  atorvastatin (LIPITOR) 20 MG tablet Take 20 mg by mouth at bedtime.    Yes Historical Provider, MD  cholecalciferol (VITAMIN D) 1000 units tablet Take 2,000 Units by mouth daily.   Yes Historical Provider, MD  clonazePAM (KLONOPIN) 1 MG tablet Take 0.5-1 mg by mouth 2 (two) times daily. Pt takes one-half tablet in the morning, and one tablet at bedtime.   Yes Historical Provider, MD  diltiazem (DILACOR XR) 180 MG 24 hr capsule Take 180 mg by mouth daily.   Yes Historical  Provider, MD  escitalopram (LEXAPRO) 20 MG tablet Take 20 mg by mouth daily.   Yes Historical Provider, MD  esomeprazole (NEXIUM) 20 MG capsule Take 20 mg by mouth 2 (two) times daily before a meal.   Yes Historical Provider, MD  fluticasone (FLONASE) 50 MCG/ACT nasal spray Place 2 sprays into both nostrils daily as needed for rhinitis.    Yes Historical Provider, MD  fluticasone furoate-vilanterol (BREO ELLIPTA) 100-25 MCG/INH AEPB Inhale 1 puff into the lungs daily.   Yes Historical Provider, MD  furosemide (LASIX) 40 MG tablet Take 1 tablet (40 mg total) by mouth every other day. Patient taking differently: Take 40 mg by mouth See admin instructions. Takes sun,tue,thur,sat 01/21/16  Yes Enedina Finner, MD  levothyroxine (SYNTHROID, LEVOTHROID) 50 MCG tablet Take 50 mcg by mouth daily before breakfast. Pt takes this dose on Sunday, Tuesday, Wednesday, Friday, and Saturday.   Yes Historical Provider, MD  levothyroxine (SYNTHROID, LEVOTHROID) 75 MCG tablet Take 75 mcg by mouth daily before breakfast. Pt takes this dose on Monday and Thursday.   Yes Historical Provider, MD  metolazone (ZAROXOLYN) 2.5 MG tablet Take 2.5 mg by mouth 2 (two) times a week. 2.5 mg every Monday & Thursday 11/01/16  Yes Historical Provider, MD  montelukast (SINGULAIR) 10 MG tablet Take 10 mg by mouth at bedtime.   Yes Historical Provider, MD  Multiple Vitamin (MULTIVITAMIN WITH MINERALS) TABS tablet Take 1 tablet by mouth daily.   Yes Historical Provider, MD  potassium chloride SA (K-DUR,KLOR-CON) 20 MEQ tablet Take 1 tablet (20 mEq total) by mouth every other day. Patient taking differently: Take 10-20 mEq by mouth 2 (two) times daily. 10 meq every morning and 20 meq every evening 01/21/16  Yes Enedina Finner, MD  predniSONE (DELTASONE) 5 MG tablet Take 5 mg by mouth daily with breakfast.   Yes Historical Provider, MD  tamsulosin (FLOMAX) 0.4 MG CAPS capsule Take 0.4 mg by mouth daily.    Yes Historical Provider, MD  warfarin  (COUMADIN) 2 MG tablet Take 1-2 mg by mouth See admin instructions. 2 mg on mon,wed,fri,sun & 1 mg tues,thurs,sat 12/12/16  Yes Historical Provider, MD  docusate sodium (COLACE) 100 MG capsule Take 1 capsule (100 mg total) by mouth 2 (two) times daily. Patient not taking: Reported on 01/18/2016 10/05/15   Katha Hamming, MD  mupirocin ointment (BACTROBAN) 2 % Place 1 application into the nose 2 (two) times daily. Patient not taking: Reported on 01/18/2016 10/05/15   Katha Hamming, MD  senna-docusate (SENOKOT-S) 8.6-50 MG tablet Take 1 tablet by mouth at bedtime as needed for mild constipation. Patient not taking: Reported on 01/18/2016 10/05/15   Katha Hamming, MD    Physical  Exam: Vitals:   01/16/17 1430 01/16/17 1500 01/16/17 1506 01/16/17 1732  BP: (!) 145/70 (!) 145/85 139/77 130/65  Pulse: 93 95 100 93  Resp:   16 15  Temp:   98.1 F (36.7 C) 99.1 F (37.3 C)  TempSrc:   Oral Oral  SpO2: 100% 100% 97% 96%  Weight:   103.4 kg (228 lb)   Height:    (1.6 m)     Constitutional: NAD, calm, comfortable Vitals:   01/16/17 1430 01/16/17 1500 01/16/17 1506 01/16/17 1732  BP: (!) 145/70 (!) 145/85 139/77 130/65  Pulse: 93 95 100 93  Resp:   16 15  Temp:   98.1 F (36.7 C) 99.1 F (37.3 C)  TempSrc:   Oral Oral  SpO2: 100% 100% 97% 96%  Weight:   103.4 kg (228 lb)   Height:    (1.6 m)    Eyes: PERRL, lids and conjunctivae normal ENMT: Mucous membranes are moist. Posterior pharynx clear of any exudate or lesions.Normal dentition.  Neck: normal, supple, no masses, no thyromegaly Respiratory: clear to auscultation bilaterally, no wheezing, no crackles. Normal respiratory effort. No accessory muscle use.  Cardiovascular: Regular rate and rhythm, no murmurs / rubs / gallops. No extremity edema. 2+ pedal pulses. No carotid bruits.  Abdomen: no tenderness, no masses palpated. No hepatosplenomegaly. Bowel sounds positive.  Musculoskeletal: no clubbing / cyanosis.  No joint deformity upper. Left leg shorter than right. Pain left leg with movement.  Skin: no rashes, lesions, ulcers. No induration Neurologic: CN 2-12 grossly intact. Sensation intact, DTR normal. Strength 5/5 in all 4. Left LE no tested due to pain.  Psychiatric: Normal judgment and insight. Alert and oriented x 3. Normal mood.    Labs on Admission: I have personally reviewed following labs and imaging studies  CBC:  Recent Labs Lab 01/16/17 1312  WBC 13.1*  NEUTROABS 10.4*  HGB 11.8*  HCT 36.1  MCV 91.9  PLT 212   Basic Metabolic Panel:  Recent Labs Lab 01/16/17 1312  NA 135  K 2.8*  CL 90*  CO2 33*  GLUCOSE 113*  BUN 32*  CREATININE 1.59*  CALCIUM 9.3   GFR: Estimated Creatinine Clearance: 35.1 mL/min (A) (by C-G formula based on SCr of 1.59 mg/dL (H)). Liver Function Tests: No results for input(s): AST, ALT, ALKPHOS, BILITOT, PROT, ALBUMIN in the last 168 hours. No results for input(s): LIPASE, AMYLASE in the last 168 hours. No results for input(s): AMMONIA in the last 168 hours. Coagulation Profile:  Recent Labs Lab 01/16/17 1312  INR 1.71   Cardiac Enzymes: No results for input(s): CKTOTAL, CKMB, CKMBINDEX, TROPONINI in the last 168 hours. BNP (last 3 results) No results for input(s): PROBNP in the last 8760 hours. HbA1C: No results for input(s): HGBA1C in the last 72 hours. CBG: No results for input(s): GLUCAP in the last 168 hours. Lipid Profile: No results for input(s): CHOL, HDL, LDLCALC, TRIG, CHOLHDL, LDLDIRECT in the last 72 hours. Thyroid Function Tests: No results for input(s): TSH, T4TOTAL, FREET4, T3FREE, THYROIDAB in the last 72 hours. Anemia Panel: No results for input(s): VITAMINB12, FOLATE, FERRITIN, TIBC, IRON, RETICCTPCT in the last 72 hours. Urine analysis:    Component Value Date/Time   COLORURINE YELLOW (A) 01/18/2016 1427   APPEARANCEUR CLEAR (A) 01/18/2016 1427   APPEARANCEUR CLEAR 01/11/2015 0950   LABSPEC 1.005  01/18/2016 1427   LABSPEC 1.005 01/11/2015 0950   PHURINE 7.0 01/18/2016 1427   GLUCOSEU NEGATIVE 01/18/2016 1427  GLUCOSEU NEGATIVE 01/11/2015 0950   HGBUR NEGATIVE 01/18/2016 1427   BILIRUBINUR NEGATIVE 01/18/2016 1427   BILIRUBINUR NEGATIVE 01/11/2015 0950   KETONESUR NEGATIVE 01/18/2016 1427   PROTEINUR NEGATIVE 01/18/2016 1427   UROBILINOGEN 1.0 11/21/2010 1115   NITRITE NEGATIVE 01/18/2016 1427   LEUKOCYTESUR NEGATIVE 01/18/2016 1427   LEUKOCYTESUR NEGATIVE 01/11/2015 0950    Radiological Exams on Admission: Dg Chest Port 1 View  Result Date: 01/16/2017 CLINICAL DATA:  Preop for left hip surgery.  Emphysema. EXAM: PORTABLE CHEST 1 VIEW COMPARISON:  Two-view chest x-ray 01/18/2016 FINDINGS: The heart size is exaggerate by low lung volumes. Atherosclerotic calcifications are present at the aortic arch. There is no edema or effusion. No focal airspace disease is present. Degenerative changes are noted at the shoulders bilaterally. IMPRESSION: 1. Low lung volumes. 2. Cardiomegaly without failure. 3. Aortic atherosclerosis. Electronically Signed   By: Marin Roberts M.D.   On: 01/16/2017 17:37   Dg Hip Unilat With Pelvis 2-3 Views Left  Result Date: 01/16/2017 CLINICAL DATA:  Fall, left hip pain EXAM: DG HIP (WITH OR WITHOUT PELVIS) 2-3V LEFT COMPARISON:  None. FINDINGS: Right hip arthroplasty in satisfactory position. Left hip arthroplasty with anterior dislocation of the femoral head component relative to the acetabular cup. No fracture is seen. Degenerative changes of the lower lumbar spine. Vascular calcifications. Right inguinal hernia mesh repair. IMPRESSION: Left hip arthroplasty with anterior dislocation of the femoral head component relative to the acetabular cup. Electronically Signed   By: Charline Bills M.D.   On: 01/16/2017 13:05    EKG: Independently reviewed. A fib HR 100  Assessment/Plan Active Problems:   Fall   Closed dislocation of left hip (HCC)   A-fib  (HCC)   Chronic diastolic CHF (congestive heart failure) (HCC)   COPD with chronic bronchitis (HCC)  1-Left hip closed dislocation, hardware.  Plan for OR 4-12.  Patient high risk for procedure due to multiples comorbidity, CKD, HF, A fib, COPD on oxygen.  EKG A fib.  Chest x ray cardiomegaly.  Will order ECHO to evaluate EF, no prior available.   2-hypokalemia;  Replaced IV.   3-HF, suspect diastolic. Unclear if systolic.  Will ordered ECHO pre -op.  Hold diuretics overnight prior to procedure. Resume diuretics post sx.  continue with BB.   4-A fib; hold coumadin pre op.  Continue with Cardizem and Atenol.  Will need to resume coumadin when ok by Ortho.   5-Hypothyroidism; continue with synthroid   6-Leukocytosis; follow trend.  Suspect stress related.   7-COPD; continue with nebulizer, and Breo.  CPAP at bedtime.   8-anxiety; will order klonopin PRN.    DVT prophylaxis: SCD, post op DVT prophylaxis per ortho/  Code Status: DNR Family Communication: =son who was at bedside.  Disposition Plan: might need rehab at discharge  Consults called: ortho  Admission status: inpatient, telemetry.    Alba Cory MD [facility] Pager [phone]  If 7PM-7AM, please contact night-coverage [email or web address] Password [facility]  01/16/2017, 6:08 PM

## 2017-01-16 NOTE — ED Notes (Signed)
Patient can't go up until 5:30-5:45 pm per Lurena Joiner, RN

## 2017-01-16 NOTE — ED Provider Notes (Signed)
[facility]-EMERGENCY DEPT Provider Note   CSN: [id number] Arrival date & time: 01/16/17  1154     History   Chief Complaint Chief Complaint  Patient presents with  . Hip Pain    HPI Maureen Wilson is a 76 y.o. female.  Maureen Wilson is a 76 y.o. Female with a history of CHF, COPD, A-fib on Coumadin, and previous hip replacement who presents to the ED complaining of left hip pain. She reports she was ambulatory with her walker when she felt a pop to her left hip which caused her to fall onto her bottom on the ground. She complains of back pain to her left hip. She feels that her left hip has come out of joint. 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 Fransico Setters 5 years ago. She denies hitting her head or loss of consciousness during this fall. She denies other complaints other than left hip pain. Patient denies fevers, recent illness, numbness, tingling, back pain, neck pain, chest pain or shortness of breath.    The history is provided by the patient, medical records and a relative. No language interpreter was used.  Hip Pain  Pertinent negatives include no chest pain, no abdominal pain, no headaches and no shortness of breath.    Past Medical History:  Diagnosis Date  . Asthma   . Atrial fibrillation (HCC)   . Back pain   . Bronchitis    hx of  . CHF (congestive heart failure) (HCC)   . Chronic kidney disease   . Chronic respiratory failure (HCC)   . COPD (chronic obstructive pulmonary disease) (HCC)   . Emphysema lung (HCC)   . Hypertension     Patient Active Problem List   Diagnosis Date Noted  . Closed dislocation of left hip (HCC) 01/16/2017  . A-fib (HCC) 01/16/2017  . Chronic diastolic CHF (congestive heart failure) (HCC) 01/16/2017  . COPD with chronic bronchitis (HCC) 01/16/2017  . Grief 01/19/2016  . Depression, major, recurrent, moderate (HCC) 01/19/2016  . ARF (acute renal failure) (HCC) 01/18/2016  . Sepsis (HCC)  01/18/2016  . Aspiration pneumonia (HCC) 10/01/2015  . Fall 09/29/2015  . Gait instability 09/29/2015    Past Surgical History:  Procedure Laterality Date  . ABDOMINAL HYSTERECTOMY    . PARTIAL HIP ARTHROPLASTY Right   . TOTAL HIP ARTHROPLASTY Left     OB History    No data available       Home Medications    Prior to Admission medications   Medication Sig Start Date End Date Taking? Authorizing Provider  albuterol (PROVENTIL) (2.5 MG/3ML) 0.083% nebulizer solution Inhale 3 mLs into the lungs every 4 (four) hours as needed for wheezing or shortness of breath.    Yes Historical Provider, MD  albuterol-ipratropium (COMBIVENT) 18-103 MCG/ACT inhaler Inhale 1-2 puffs into the lungs every 4 (four) hours as needed for wheezing or shortness of breath.    Yes Historical Provider, MD  aspirin EC 81 MG tablet Take 81 mg by mouth daily.   Yes Historical Provider, MD  atenolol (TENORMIN) 25 MG tablet Take 25 mg by mouth daily.    Yes Historical Provider, MD  atorvastatin (LIPITOR) 20 MG tablet Take 20 mg by mouth at bedtime.    Yes Historical Provider, MD  cholecalciferol (VITAMIN D) 1000 units tablet Take 2,000 Units by mouth daily.   Yes Historical Provider, MD  clonazePAM (KLONOPIN) 1 MG tablet Take 0.5-1 mg by mouth 2 (two) times  daily. Pt takes one-half tablet in the morning, and one tablet at bedtime.   Yes Historical Provider, MD  diltiazem (DILACOR XR) 180 MG 24 hr capsule Take 180 mg by mouth daily.   Yes Historical Provider, MD  escitalopram (LEXAPRO) 20 MG tablet Take 20 mg by mouth daily.   Yes Historical Provider, MD  esomeprazole (NEXIUM) 20 MG capsule Take 20 mg by mouth 2 (two) times daily before a meal.   Yes Historical Provider, MD  fluticasone (FLONASE) 50 MCG/ACT nasal spray Place 2 sprays into both nostrils daily as needed for rhinitis.    Yes Historical Provider, MD  fluticasone furoate-vilanterol (BREO ELLIPTA) 100-25 MCG/INH AEPB Inhale 1 puff into the lungs daily.   Yes  Historical Provider, MD  furosemide (LASIX) 40 MG tablet Take 1 tablet (40 mg total) by mouth every other day. Patient taking differently: Take 40 mg by mouth See admin instructions. Takes sun,tue,thur,sat 01/21/16  Yes Enedina Finner, MD  levothyroxine (SYNTHROID, LEVOTHROID) 50 MCG tablet Take 50 mcg by mouth daily before breakfast. Pt takes this dose on Sunday, Tuesday, Wednesday, Friday, and Saturday.   Yes Historical Provider, MD  levothyroxine (SYNTHROID, LEVOTHROID) 75 MCG tablet Take 75 mcg by mouth daily before breakfast. Pt takes this dose on Monday and Thursday.   Yes Historical Provider, MD  metolazone (ZAROXOLYN) 2.5 MG tablet Take 2.5 mg by mouth 2 (two) times a week. 2.5 mg every Monday & Thursday 11/01/16  Yes Historical Provider, MD  montelukast (SINGULAIR) 10 MG tablet Take 10 mg by mouth at bedtime.   Yes Historical Provider, MD  Multiple Vitamin (MULTIVITAMIN WITH MINERALS) TABS tablet Take 1 tablet by mouth daily.   Yes Historical Provider, MD  potassium chloride SA (K-DUR,KLOR-CON) 20 MEQ tablet Take 1 tablet (20 mEq total) by mouth every other day. Patient taking differently: Take 10-20 mEq by mouth 2 (two) times daily. 10 meq every morning and 20 meq every evening 01/21/16  Yes Enedina Finner, MD  predniSONE (DELTASONE) 5 MG tablet Take 5 mg by mouth daily with breakfast.   Yes Historical Provider, MD  tamsulosin (FLOMAX) 0.4 MG CAPS capsule Take 0.4 mg by mouth daily.    Yes Historical Provider, MD  warfarin (COUMADIN) 2 MG tablet Take 1-2 mg by mouth See admin instructions. 2 mg on mon,wed,fri,sun & 1 mg tues,thurs,sat 12/12/16  Yes Historical Provider, MD    Family History Family History  Problem Relation Age of Onset  . Hypertension Son     Social History Social History  Substance Use Topics  . Smoking status: Never Smoker  . Smokeless tobacco: Never Used  . Alcohol use No     Allergies   Amlodipine; Hydralazine; Iohexol; Metoprolol; Olmesartan; Ramipril; Sulfa  antibiotics; Telmisartan; and Tiotropium   Review of Systems Review of Systems  Constitutional: Negative for chills and fever.  HENT: Negative for congestion and sore throat.   Eyes: Negative for visual disturbance.  Respiratory: Negative for cough and shortness of breath.   Cardiovascular: Negative for chest pain.  Gastrointestinal: Negative for abdominal pain, nausea and vomiting.  Genitourinary: Negative for dysuria.  Musculoskeletal: Positive for arthralgias. Negative for back pain and neck pain.  Skin: Negative for rash.  Neurological: Negative for dizziness, weakness, light-headedness, numbness and headaches.     Physical Exam Updated Vital Signs BP 130/65 (BP Location: Right Arm)   Pulse 93   Temp 99.1 F (37.3 C) (Oral)   Resp 15   Ht  (1.6 m)   Wt  103.4 kg   SpO2 96%   BMI 40.39 kg/m   Physical Exam  Constitutional: She is oriented to person, place, and time. She appears well-developed and well-nourished. No distress.  Nontoxic appearing.   HENT:  Head: Normocephalic and atraumatic.  Right Ear: External ear normal.  Left Ear: External ear normal.  Mouth/Throat: Oropharynx is clear and moist.  Eyes: Conjunctivae are normal. Pupils are equal, round, and reactive to light. Right eye exhibits no discharge. Left eye exhibits no discharge.  Neck: Neck supple.  Cardiovascular: Normal rate, regular rhythm, normal heart sounds and intact distal pulses.   Bilateral radial, posterior tibialis and dorsalis pedis pulses are intact.    Pulmonary/Chest: Effort normal and breath sounds normal. No respiratory distress. She has no wheezes. She has no rales.  Abdominal: Soft. There is no tenderness. There is no guarding.  Musculoskeletal: She exhibits tenderness. She exhibits no edema.  TTP to left hip pain. Shortening and rotation of her left leg. No deformity.  No other visible or palpated signs of trauma or injury.   Lymphadenopathy:    She has no cervical adenopathy.    Neurological: She is alert and oriented to person, place, and time. No sensory deficit. Coordination normal.  Sensation is intact in her bilateral upper and lower extremities. She is alert and oriented 3.  Skin: Skin is warm and dry. Capillary refill takes less than 2 seconds. No rash noted. She is not diaphoretic. No erythema. No pallor.  Psychiatric: She has a normal mood and affect. Her behavior is normal.  Nursing note and vitals reviewed.    ED Treatments / Results  Labs (all labs ordered are listed, but only abnormal results are displayed) Labs Reviewed  BASIC METABOLIC PANEL - Abnormal; Notable for the following:       Result Value   Potassium 2.8 (*)    Chloride 90 (*)    CO2 33 (*)    Glucose, Bld 113 (*)    BUN 32 (*)    Creatinine, Ser 1.59 (*)    GFR calc non Af Amer 31 (*)    GFR calc Af Amer 36 (*)    All other components within normal limits  CBC WITH DIFFERENTIAL/PLATELET - Abnormal; Notable for the following:    WBC 13.1 (*)    Hemoglobin 11.8 (*)    Neutro Abs 10.4 (*)    All other components within normal limits  PROTIME-INR - Abnormal; Notable for the following:    Prothrombin Time 20.3 (*)    All other components within normal limits  TYPE AND SCREEN    EKG  EKG Interpretation None       Radiology Dg Chest Port 1 View  Result Date: 01/16/2017 CLINICAL DATA:  Preop for left hip surgery.  Emphysema. EXAM: PORTABLE CHEST 1 VIEW COMPARISON:  Two-view chest x-ray 01/18/2016 FINDINGS: The heart size is exaggerate by low lung volumes. Atherosclerotic calcifications are present at the aortic arch. There is no edema or effusion. No focal airspace disease is present. Degenerative changes are noted at the shoulders bilaterally. IMPRESSION: 1. Low lung volumes. 2. Cardiomegaly without failure. 3. Aortic atherosclerosis. Electronically Signed   By: Marin Roberts M.D.   On: 01/16/2017 17:37   Dg Hip Unilat With Pelvis 2-3 Views Left  Result Date:  01/16/2017 CLINICAL DATA:  Fall, left hip pain EXAM: DG HIP (WITH OR WITHOUT PELVIS) 2-3V LEFT COMPARISON:  None. FINDINGS: Right hip arthroplasty in satisfactory position. Left hip arthroplasty with anterior dislocation of  the femoral head component relative to the acetabular cup. No fracture is seen. Degenerative changes of the lower lumbar spine. Vascular calcifications. Right inguinal hernia mesh repair. IMPRESSION: Left hip arthroplasty with anterior dislocation of the femoral head component relative to the acetabular cup. Electronically Signed   By: Charline Bills M.D.   On: 01/16/2017 13:05    Procedures Procedures (including critical care time)  Medications Ordered in ED Medications  potassium chloride 10 mEq in 100 mL IVPB (not administered)  phytonadione (VITAMIN K) tablet 5 mg (not administered)  diltiazem (CARDIZEM CD) 24 hr capsule 180 mg (not administered)  atenolol (TENORMIN) tablet 25 mg (not administered)  fentaNYL (SUBLIMAZE) injection 50 mcg (50 mcg Intravenous Given 01/16/17 1315)  fentaNYL (SUBLIMAZE) injection 100 mcg (100 mcg Intravenous Given 01/16/17 1524)     Initial Impression / Assessment and Plan / ED Course  I have reviewed the triage vital signs and the nursing notes.  Pertinent labs & imaging results that were available during my care of the patient were reviewed by me and considered in my medical decision making (see chart for details).    This  is a 76 y.o. Female with a history of CHF, COPD, A-fib on Coumadin, and previous hip replacement who presents to the ED complaining of left hip pain. She reports she was ambulatory with her walker when she felt a pop to her left hip which caused her to fall onto her bottom on the ground. She complains of back pain to her left hip. She feels that her left hip has come out of joint. 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 Fransico Setters 5 years ago. On exam the patient is afebrile nontoxic  appearing. She has some shortening and rotation of her left leg. Pain to palpation overlying her left lateral hip. Concern for dislocation. She is neurovascularly intact on exam. No other evidence of injury or trauma. X-ray of her left hip with pelvis indicates an anterior dislocation of the femoral head. Patient also has a locking ring. Will consult with orthopedic surgery. Blood work is remarkable for potassium of 2.8. Creatinine is around her baseline. Will provide with 3 rounds of IV potassium and keep her NPO at this time until ortho can assess.  I consulted with Dr. Ranell Patrick who will be by to see the patient when able. He is concerned that the patient will require surgery to reduce this hip due to the locking ring. We will not attempt reduction of this hip in the ER.  I discussed plan with the patient. She tells me she does not want a lot of pain medication, but still does have pain. Will provide with more fentanyl.   Dr. Ranell Patrick by to see the patient. He also spoke with Dr. Merlyn Albert. Plan is for admission by medicine for the patient to go to the OR in the morning.  She can have something to eat now and NPO after midnight. NS ordered food for patient.   I consulted with Dr. Sunnie Nielsen from [facility] hospitalist who accepted the patient for admission.   This patient was discussed with and evaluated by Dr. Clarene Duke who agrees with assessment and plan.   Final Clinical Impressions(s) / ED Diagnoses   Final diagnoses:  Dislocation of left hip, initial encounter (HCC)  Hypokalemia  Fall, initial encounter  Anticoagulated on Coumadin    New Prescriptions Current Discharge Medication List       Everlene Farrier, PA-C 01/16/17 9604  Laurence Spates, MD 01/18/17 [phone]

## 2017-01-16 NOTE — Consult Note (Signed)
Reason for Consult:dislocated left total hip replacement Referring Physician: EDP  Maureen Wilson is an 76 y.o. female.  HPI: 76 yo female who presents with acute left hip pain after experiencing first a "loud pop" in her left hip and then falling to the ground and landing on her left side.  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  She has not had any issues with this hip prior to today. After fall unable to stand due to hip pain.  Pain now with any hip movement.   Past Medical History:  Diagnosis Date  . Asthma   . Atrial fibrillation ([place])   . Back pain   . Bronchitis    hx of  . CHF (congestive heart failure) ([place])   . Chronic kidney disease   . Chronic respiratory failure ([place])   . COPD (chronic obstructive pulmonary disease) ([place])   . Emphysema lung ([place])   . Hypertension     Past Surgical History:  Procedure Laterality Date  . ABDOMINAL HYSTERECTOMY    . PARTIAL HIP ARTHROPLASTY Right   . TOTAL HIP ARTHROPLASTY Left     Family History  Problem Relation Age of Onset  . Hypertension Son     Social History:  reports that she has never smoked. She has never used smokeless tobacco. She reports that she does not drink alcohol or use drugs.  Allergies:  Allergies  Allergen Reactions  . Amlodipine Swelling and Other (See Comments)    Reaction:  Lip/mouth swelling   . Hydralazine Swelling and Other (See Comments)    Reaction:  Lip/mouth swelling   . Iohexol Hives and Other (See Comments)    Desc: Pt developed hives after receiving iv dye for ct scan.  suggest she be premedicated for future exams, Onset Date: [id number]   . Metoprolol Swelling and Other (See Comments)    Reaction:  Lip/mouth swelling   . Olmesartan Swelling and Other (See Comments)    Reaction:  Lip/mouth swelling   . Ramipril Swelling and Other (See Comments)    Reaction:  Lip/mouth swelling   . Sulfa Antibiotics Swelling and Other (See Comments)   Reaction:  Lip/mouth swelling   . Telmisartan Swelling and Other (See Comments)    Reaction:  Lip/mouth swelling   . Tiotropium Swelling and Other (See Comments)    Reaction:  Lip/mouth swelling     Medications: I have reviewed the Maureen Wilson's current medications.  Results for orders placed or performed during the hospital encounter of 01/16/17 (from the past 48 hour(s))  Basic metabolic panel     Status: Abnormal   Collection Time: 01/16/17  1:12 PM  Result Value Ref Range   Sodium 135 135 - 145 mmol/L   Potassium 2.8 (L) 3.5 - 5.1 mmol/L   Chloride 90 (L) 101 - 111 mmol/L   CO2 33 (H) 22 - 32 mmol/L   Glucose, Bld 113 (H) 65 - 99 mg/dL   BUN 32 (H) 6 - 20 mg/dL   Creatinine, Ser 1.59 (H) 0.44 - 1.00 mg/dL   Calcium 9.3 8.9 - 10.3 mg/dL   GFR calc non Af Amer 31 (L) >60 mL/min   GFR calc Af Amer 36 (L) >60 mL/min    Comment: (NOTE) The eGFR has been calculated using the CKD EPI equation. This calculation has not been validated in all clinical situations. eGFR's persistently <60 mL/min signify possible Chronic Kidney Disease.    Anion gap 12 5 - 15  CBC with Differential     Status: Abnormal   Collection Time: 01/16/17  1:12 PM  Result Value Ref Range   WBC 13.1 (H) 4.0 - 10.5 K/uL   RBC 3.93 3.87 - 5.11 MIL/uL   Hemoglobin 11.8 (L) 12.0 - 15.0 g/dL   HCT 36.1 36.0 - 46.0 %   MCV 91.9 78.0 - 100.0 fL   MCH 30.0 26.0 - 34.0 pg   MCHC 32.7 30.0 - 36.0 g/dL   RDW 14.8 11.5 - 15.5 %   Platelets 212 150 - 400 K/uL   Neutrophils Relative % 79 %   Neutro Abs 10.4 (H) 1.7 - 7.7 K/uL   Lymphocytes Relative 12 %   Lymphs Abs 1.6 0.7 - 4.0 K/uL   Monocytes Relative 8 %   Monocytes Absolute 1.0 0.1 - 1.0 K/uL   Eosinophils Relative 1 %   Eosinophils Absolute 0.1 0.0 - 0.7 K/uL   Basophils Relative 0 %   Basophils Absolute 0.0 0.0 - 0.1 K/uL  Protime-INR     Status: Abnormal   Collection Time: 01/16/17  1:12 PM  Result Value Ref Range   Prothrombin Time 20.3 (H) 11.4 - 15.2  seconds   INR 1.71     Dg Hip Unilat With Pelvis 2-3 Views Left  Result Date: 01/16/2017 CLINICAL DATA:  Fall, left hip pain EXAM: DG HIP (WITH OR WITHOUT PELVIS) 2-3V LEFT COMPARISON:  None. FINDINGS: Right hip arthroplasty in satisfactory position. Left hip arthroplasty with anterior dislocation of the femoral head component relative to the acetabular cup. No fracture is seen. Degenerative changes of the lower lumbar spine. Vascular calcifications. Right inguinal hernia mesh repair. IMPRESSION: Left hip arthroplasty with anterior dislocation of the femoral head component relative to the acetabular cup. Electronically Signed   By: Julian Hy M.D.   On: 01/16/2017 13:05    ROS Blood pressure 139/77, pulse 100, temperature 98.1 F (36.7 C), temperature source Oral, resp. rate 16, height '5\' 3"'  (1.6 m), weight 103.4 kg (228 lb), SpO2 97 %. Physical Exam  Maureen Wilson laying on ER stretcher in moderate distress.  Pain with any left hip movement. Left leg shorter than right. Knee is nontender with no deformity. Able to move her ankle and sensation and pulses are intact to the left LE. 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

## 2017-01-16 NOTE — ED Triage Notes (Signed)
She states she felt her "left come out"

## 2017-01-17 ENCOUNTER — Encounter (HOSPITAL_COMMUNITY): Payer: Self-pay | Admitting: Anesthesiology

## 2017-01-17 ENCOUNTER — Inpatient Hospital Stay (HOSPITAL_COMMUNITY): Admission: RE | Admit: 2017-01-17 | Payer: Medicare Other | Source: Ambulatory Visit | Admitting: Orthopedic Surgery

## 2017-01-17 ENCOUNTER — Inpatient Hospital Stay (HOSPITAL_COMMUNITY): Payer: Medicare Other | Admitting: Anesthesiology

## 2017-01-17 ENCOUNTER — Encounter (HOSPITAL_COMMUNITY)
Admission: EM | Disposition: A | Discharge status: Skilled Nursing Facility | Payer: Self-pay | Source: Home / Self Care | Attending: Internal Medicine

## 2017-01-17 ENCOUNTER — Inpatient Hospital Stay (HOSPITAL_COMMUNITY): Payer: Medicare Other

## 2017-01-17 DIAGNOSIS — T84028A Dislocation of other internal joint prosthesis, initial encounter: Secondary | ICD-10-CM

## 2017-01-17 DIAGNOSIS — J449 Chronic obstructive pulmonary disease, unspecified: Secondary | ICD-10-CM

## 2017-01-17 DIAGNOSIS — I5032 Chronic diastolic (congestive) heart failure: Secondary | ICD-10-CM

## 2017-01-17 DIAGNOSIS — Z96649 Presence of unspecified artificial hip joint: Secondary | ICD-10-CM

## 2017-01-17 DIAGNOSIS — S73005A Unspecified dislocation of left hip, initial encounter: Secondary | ICD-10-CM

## 2017-01-17 DIAGNOSIS — I4891 Unspecified atrial fibrillation: Secondary | ICD-10-CM

## 2017-01-17 HISTORY — PX: TOTAL HIP REVISION: SHX763

## 2017-01-17 LAB — CBC
HCT: 34.6 % — ABNORMAL LOW (ref 36.0–46.0)
HCT: 37.3 % (ref 36.0–46.0)
HEMOGLOBIN: 11.2 g/dL — AB (ref 12.0–15.0)
Hemoglobin: 12 g/dL (ref 12.0–15.0)
MCH: 29.9 pg (ref 26.0–34.0)
MCH: 28.7 pg (ref 26.0–34.0)
MCHC: 32.4 g/dL (ref 30.0–36.0)
MCHC: 32.2 g/dL (ref 30.0–36.0)
MCV: 92.5 fL (ref 78.0–100.0)
MCV: 89.2 fL (ref 78.0–100.0)
PLATELETS: 191 10*3/uL (ref 150–400)
Platelets: 169 10*3/uL (ref 150–400)
RBC: 3.74 MIL/uL — AB (ref 3.87–5.11)
RBC: 4.18 MIL/uL (ref 3.87–5.11)
RDW: 15 % (ref 11.5–15.5)
RDW: 15.8 % — AB (ref 11.5–15.5)
WBC: 8.7 10*3/uL (ref 4.0–10.5)
WBC: 14.8 10*3/uL — ABNORMAL HIGH (ref 4.0–10.5)

## 2017-01-17 LAB — PREPARE RBC (CROSSMATCH)

## 2017-01-17 LAB — PROTIME-INR
INR: 1.61
Prothrombin Time: 19.4 seconds — ABNORMAL HIGH (ref 11.4–15.2)

## 2017-01-17 LAB — BASIC METABOLIC PANEL
ANION GAP: 10 (ref 5–15)
BUN: 30 mg/dL — ABNORMAL HIGH (ref 6–20)
CHLORIDE: 91 mmol/L — AB (ref 101–111)
CO2: 33 mmol/L — ABNORMAL HIGH (ref 22–32)
CREATININE: 1.54 mg/dL — AB (ref 0.44–1.00)
Calcium: 8.9 mg/dL (ref 8.9–10.3)
GFR calc non Af Amer: 32 mL/min — ABNORMAL LOW (ref >60)
GFR, EST AFRICAN AMERICAN: 37 mL/min — AB (ref >60)
Glucose, Bld: 86 mg/dL (ref 65–99)
Potassium: 3 mmol/L — ABNORMAL LOW (ref 3.5–5.1)
SODIUM: 134 mmol/L — AB (ref 135–145)

## 2017-01-17 LAB — MRSA PCR SCREENING: MRSA by PCR: POSITIVE — AB

## 2017-01-17 LAB — CREATININE, SERUM
Creatinine, Ser: 1.7 mg/dL — ABNORMAL HIGH (ref 0.44–1.00)
GFR calc Af Amer: 33 mL/min — ABNORMAL LOW (ref >60)
GFR calc non Af Amer: 28 mL/min — ABNORMAL LOW (ref >60)

## 2017-01-17 LAB — ECHOCARDIOGRAM COMPLETE
HEIGHTINCHES: 63 in
Weight: 3648 oz

## 2017-01-17 LAB — SURGICAL PCR SCREEN
MRSA, PCR: POSITIVE — AB
Staphylococcus aureus: POSITIVE — AB

## 2017-01-17 SURGERY — TOTAL HIP REVISION
Anesthesia: General | Site: Hip | Laterality: Left

## 2017-01-17 MED ORDER — SUGAMMADEX SODIUM 200 MG/2ML IV SOLN
INTRAVENOUS | Status: DC | PRN
  Administered 2017-01-17: 200 mg via INTRAVENOUS

## 2017-01-17 MED ORDER — FENTANYL CITRATE (PF) 250 MCG/5ML IJ SOLN
INTRAMUSCULAR | Status: AC
Start: 2017-01-17 — End: 2017-01-17
  Filled 2017-01-17: qty 5

## 2017-01-17 MED ORDER — SODIUM CHLORIDE 0.9 % IV SOLN: Freq: Once | INTRAVENOUS | Status: DC

## 2017-01-17 MED ORDER — ACETAMINOPHEN 10 MG/ML IV SOLN
INTRAVENOUS | Status: AC
  Filled 2017-01-17: qty 100

## 2017-01-17 MED ORDER — BUPIVACAINE HCL (PF) 0.25 % IJ SOLN
INTRAMUSCULAR | Status: DC | PRN
  Administered 2017-01-17: 30 mL

## 2017-01-17 MED ORDER — WARFARIN - PHARMACIST DOSING INPATIENT: Freq: Every day | Status: DC

## 2017-01-17 MED ORDER — ACETAMINOPHEN 10 MG/ML IV SOLN
1000.0000 mg | Freq: Once | INTRAVENOUS | Status: DC | PRN
  Administered 2017-01-17: 1000 mg via INTRAVENOUS

## 2017-01-17 MED ORDER — ROCURONIUM BROMIDE 100 MG/10ML IV SOLN
INTRAVENOUS | Status: DC | PRN
  Administered 2017-01-17: 30 mg via INTRAVENOUS
  Administered 2017-01-17 (×2): 10 mg via INTRAVENOUS

## 2017-01-17 MED ORDER — MEPERIDINE HCL 50 MG/ML IJ SOLN: 6.2500 mg | INTRAMUSCULAR | Status: DC | PRN

## 2017-01-17 MED ORDER — 0.9 % SODIUM CHLORIDE (POUR BTL) OPTIME
TOPICAL | Status: DC | PRN
  Administered 2017-01-17: 1000 mL

## 2017-01-17 MED ORDER — SUCCINYLCHOLINE CHLORIDE 20 MG/ML IJ SOLN
INTRAMUSCULAR | Status: DC | PRN
  Administered 2017-01-17: 100 mg via INTRAVENOUS

## 2017-01-17 MED ORDER — PROMETHAZINE HCL 25 MG/ML IJ SOLN: 6.2500 mg | INTRAMUSCULAR | Status: DC | PRN

## 2017-01-17 MED ORDER — PROPOFOL 10 MG/ML IV BOLUS
INTRAVENOUS | Status: DC | PRN
  Administered 2017-01-17: 140 mg via INTRAVENOUS

## 2017-01-17 MED ORDER — KETOROLAC TROMETHAMINE 30 MG/ML IJ SOLN
INTRAMUSCULAR | Status: DC | PRN
  Administered 2017-01-17: 30 mg

## 2017-01-17 MED ORDER — POTASSIUM CHLORIDE CRYS ER 20 MEQ PO TBCR
40.0000 meq | EXTENDED_RELEASE_TABLET | Freq: Two times a day (BID) | ORAL | Status: AC
  Administered 2017-01-17: 40 meq via ORAL
  Filled 2017-01-17 (×2): qty 2

## 2017-01-17 MED ORDER — HYDROMORPHONE HCL 2 MG/ML IJ SOLN
0.2500 mg | INTRAMUSCULAR | Status: DC | PRN
  Administered 2017-01-17 (×2): 0.5 mg via INTRAVENOUS

## 2017-01-17 MED ORDER — VANCOMYCIN HCL IN DEXTROSE 1-5 GM/200ML-% IV SOLN
1000.0000 mg | Freq: Two times a day (BID) | INTRAVENOUS | Status: AC
  Administered 2017-01-18: 1000 mg via INTRAVENOUS

## 2017-01-17 MED ORDER — STERILE WATER FOR IRRIGATION IR SOLN
Status: DC | PRN
  Administered 2017-01-17: 2000 mL

## 2017-01-17 MED ORDER — LACTATED RINGERS IV SOLN
INTRAVENOUS | Status: DC
  Administered 2017-01-17: 16:00:00 via INTRAVENOUS

## 2017-01-17 MED ORDER — ENOXAPARIN SODIUM 30 MG/0.3ML ~~LOC~~ SOLN
30.0000 mg | SUBCUTANEOUS | Status: DC
  Administered 2017-01-18 – 2017-01-19 (×2): 30 mg via SUBCUTANEOUS
  Filled 2017-01-17 (×2): qty 0.3

## 2017-01-17 MED ORDER — DEXTROSE-NACL 5-0.9 % IV SOLN
INTRAVENOUS | Status: DC
  Administered 2017-01-17: 10:00:00 via INTRAVENOUS

## 2017-01-17 MED ORDER — SODIUM CHLORIDE 0.9 % IV BOLUS (SEPSIS): 500.0000 mL | Freq: Once | INTRAVENOUS | Status: DC | PRN

## 2017-01-17 MED ORDER — LIDOCAINE 2% (20 MG/ML) 5 ML SYRINGE
INTRAMUSCULAR | Status: AC
  Filled 2017-01-17: qty 5

## 2017-01-17 MED ORDER — TRANEXAMIC ACID 1000 MG/10ML IV SOLN
1000.0000 mg | Freq: Once | INTRAVENOUS | Status: AC
  Administered 2017-01-17: 1000 mg via INTRAVENOUS
  Filled 2017-01-17: qty 1100

## 2017-01-17 MED ORDER — PHENOL 1.4 % MT LIQD: 1.0000 | OROMUCOSAL | Status: DC | PRN

## 2017-01-17 MED ORDER — FENTANYL CITRATE (PF) 100 MCG/2ML IJ SOLN
INTRAMUSCULAR | Status: DC | PRN
Start: 2017-01-17 — End: 2017-01-17
  Administered 2017-01-17 (×5): 50 ug via INTRAVENOUS

## 2017-01-17 MED ORDER — MUPIROCIN 2 % EX OINT
1.0000 "application " | TOPICAL_OINTMENT | Freq: Two times a day (BID) | CUTANEOUS | Status: AC
  Administered 2017-01-17 – 2017-01-21 (×10): 1 via NASAL
  Filled 2017-01-17 (×2): qty 22

## 2017-01-17 MED ORDER — VANCOMYCIN HCL 500 MG IV SOLR
500.0000 mg | Freq: Once | INTRAVENOUS | Status: AC
  Administered 2017-01-17: 500 mg via INTRAVENOUS
  Filled 2017-01-17: qty 500

## 2017-01-17 MED ORDER — ACETAMINOPHEN 650 MG RE SUPP: 650.0000 mg | Freq: Four times a day (QID) | RECTAL | Status: DC | PRN

## 2017-01-17 MED ORDER — PROPOFOL 10 MG/ML IV BOLUS
INTRAVENOUS | Status: AC
Start: 2017-01-17 — End: 2017-01-17
  Filled 2017-01-17: qty 20

## 2017-01-17 MED ORDER — LIDOCAINE HCL (CARDIAC) 20 MG/ML IV SOLN
INTRAVENOUS | Status: DC | PRN
  Administered 2017-01-17: 50 mg via INTRAVENOUS

## 2017-01-17 MED ORDER — SODIUM CHLORIDE 0.9 % IJ SOLN
INTRAMUSCULAR | Status: DC | PRN
  Administered 2017-01-17: 30 mL

## 2017-01-17 MED ORDER — WARFARIN SODIUM 2.5 MG PO TABS
2.5000 mg | ORAL_TABLET | ORAL | Status: AC
  Filled 2017-01-17: qty 1

## 2017-01-17 MED ORDER — ACETAMINOPHEN 325 MG PO TABS: 650.0000 mg | ORAL_TABLET | Freq: Four times a day (QID) | ORAL | Status: DC | PRN

## 2017-01-17 MED ORDER — ONDANSETRON HCL 4 MG/2ML IJ SOLN
INTRAMUSCULAR | Status: AC
  Filled 2017-01-17: qty 2

## 2017-01-17 MED ORDER — ALBUMIN HUMAN 5 % IV SOLN
INTRAVENOUS | Status: DC | PRN
  Administered 2017-01-17: 18:00:00 via INTRAVENOUS

## 2017-01-17 MED ORDER — CHLORHEXIDINE GLUCONATE CLOTH 2 % EX PADS
6.0000 | MEDICATED_PAD | Freq: Every day | CUTANEOUS | Status: DC
  Administered 2017-01-17: 6 via TOPICAL

## 2017-01-17 MED ORDER — LACTATED RINGERS IV SOLN
INTRAVENOUS | Status: DC | PRN
  Administered 2017-01-17 (×2): via INTRAVENOUS

## 2017-01-17 MED ORDER — HYDROMORPHONE HCL 2 MG/ML IJ SOLN
INTRAMUSCULAR | Status: AC
Start: 2017-01-17 — End: 2017-01-18
  Filled 2017-01-17: qty 1

## 2017-01-17 MED ORDER — SODIUM CHLORIDE 0.9 % IV SOLN
INTRAVENOUS | Status: DC
Start: 2017-01-17 — End: 2017-01-19
  Administered 2017-01-17 – 2017-01-19 (×3): via INTRAVENOUS

## 2017-01-17 MED ORDER — ROCURONIUM BROMIDE 50 MG/5ML IV SOSY
PREFILLED_SYRINGE | INTRAVENOUS | Status: AC
  Filled 2017-01-17: qty 5

## 2017-01-17 MED ORDER — MENTHOL 3 MG MT LOZG
1.0000 | LOZENGE | OROMUCOSAL | Status: DC | PRN
  Administered 2017-01-18 – 2017-01-19 (×2): 3 mg via ORAL
  Filled 2017-01-17: qty 9

## 2017-01-17 MED ORDER — SODIUM CHLORIDE 0.9 % IR SOLN
Status: DC | PRN
  Administered 2017-01-17: 3000 mL

## 2017-01-17 SURGICAL SUPPLY — 64 items
BAG DECANTER FOR FLEXI CONT (MISCELLANEOUS) ×3 IMPLANT
BAG ZIPLOCK 12X15 (MISCELLANEOUS) ×3 IMPLANT
BALL HIP 32MM PLUS 3 (Hips) ×1 IMPLANT
BLADE SAW SGTL 18X1.27X75 (BLADE) ×2 IMPLANT
BLADE SAW SGTL 18X1.27X75MM (BLADE) ×1
BRUSH FEMORAL CANAL (MISCELLANEOUS) IMPLANT
CHLORAPREP W/TINT 26ML (MISCELLANEOUS) ×3 IMPLANT
COVER SURGICAL LIGHT HANDLE (MISCELLANEOUS) ×3 IMPLANT
DERMABOND ADVANCED (GAUZE/BANDAGES/DRESSINGS) ×2
DERMABOND ADVANCED .7 DNX12 (GAUZE/BANDAGES/DRESSINGS) ×1 IMPLANT
DRAPE HIP W/POCKET STRL (DRAPE) ×3 IMPLANT
DRAPE INCISE IOBAN 85X60 (DRAPES) ×6 IMPLANT
DRAPE ORTHO SPLIT 77X108 STRL (DRAPES) ×4
DRAPE POUCH INSTRU U-SHP 10X18 (DRAPES) ×3 IMPLANT
DRAPE SHEET LG 3/4 BI-LAMINATE (DRAPES) ×6 IMPLANT
DRAPE SURG 17X11 SM STRL (DRAPES) ×3 IMPLANT
DRAPE SURG ORHT 6 SPLT 77X108 (DRAPES) ×2 IMPLANT
DRAPE U-SHAPE 47X51 STRL (DRAPES) ×3 IMPLANT
DRSG AQUACEL AG ADV 3.5X10 (GAUZE/BANDAGES/DRESSINGS) ×3 IMPLANT
DRSG AQUACEL AG ADV 3.5X14 (GAUZE/BANDAGES/DRESSINGS) ×3 IMPLANT
ELECT BLADE TIP CTD 4 INCH (ELECTRODE) ×3 IMPLANT
ELECT REM PT RETURN 15FT ADLT (MISCELLANEOUS) ×3 IMPLANT
FACESHIELD WRAPAROUND (MASK) ×12 IMPLANT
GAUZE SPONGE 2X2 8PLY STRL LF (GAUZE/BANDAGES/DRESSINGS) IMPLANT
GLOVE BIO SURGEON STRL SZ8.5 (GLOVE) ×12 IMPLANT
GLOVE BIOGEL PI IND STRL 8.5 (GLOVE) ×2 IMPLANT
GLOVE BIOGEL PI INDICATOR 8.5 (GLOVE) ×4
GOWN SPEC L3 XXLG W/TWL (GOWN DISPOSABLE) ×3 IMPLANT
HANDPIECE INTERPULSE COAX TIP (DISPOSABLE)
HEAD FEM SROM 32 +6 (Hips) ×3 IMPLANT
HIP BALL 32MM PLUS 3 (Hips) ×3 IMPLANT
LINER PINNACLE (Hips) ×3 IMPLANT
MANIFOLD NEPTUNE II (INSTRUMENTS) ×3 IMPLANT
NDL SAFETY ECLIPSE 18X1.5 (NEEDLE) ×1 IMPLANT
NEEDLE HYPO 18GX1.5 SHARP (NEEDLE) ×2
NEEDLE SPNL 18GX3.5 QUINCKE PK (NEEDLE) ×3 IMPLANT
NS IRRIG 1000ML POUR BTL (IV SOLUTION) ×6 IMPLANT
POSITIONER SURGICAL ARM (MISCELLANEOUS) ×3 IMPLANT
PRESSURIZER FEMORAL UNIV (MISCELLANEOUS) IMPLANT
PREVENA INCISION MGT 90 150 (MISCELLANEOUS) ×3 IMPLANT
SET HNDPC FAN SPRY TIP SCT (DISPOSABLE) IMPLANT
SPONGE GAUZE 2X2 STER 10/PKG (GAUZE/BANDAGES/DRESSINGS)
SPONGE LAP 18X18 X RAY DECT (DISPOSABLE) ×3 IMPLANT
SROM FEMOR STEM HIP STD 18X13 (Hips) ×3 IMPLANT
STAPLER VISISTAT 35W (STAPLE) ×6 IMPLANT
STEM HIP FMRL SROM STD 18X13 (Hips) ×1 IMPLANT
SUCTION FRAZIER HANDLE 10FR (MISCELLANEOUS) ×2
SUCTION TUBE FRAZIER 10FR DISP (MISCELLANEOUS) ×1 IMPLANT
SUT ETHIBOND NAB CT1 #1 30IN (SUTURE) ×6 IMPLANT
SUT MNCRL AB 3-0 PS2 18 (SUTURE) ×3 IMPLANT
SUT MON AB 2-0 CT1 36 (SUTURE) ×12 IMPLANT
SUT STRATAFIX PDO 1 14 VIOLET (SUTURE) ×2
SUT STRATFX PDO 1 14 VIOLET (SUTURE) ×1
SUT VIC AB 1 CT1 36 (SUTURE) ×6 IMPLANT
SUT VIC AB 2-0 CT1 27 (SUTURE) ×2
SUT VIC AB 2-0 CT1 TAPERPNT 27 (SUTURE) ×1 IMPLANT
SUT VLOC 180 0 24IN GS25 (SUTURE) ×6 IMPLANT
SUTURE STRATFX PDO 1 14 VIOLET (SUTURE) ×1 IMPLANT
SYR 50ML LL SCALE MARK (SYRINGE) ×6 IMPLANT
TOWEL OR 17X26 10 PK STRL BLUE (TOWEL DISPOSABLE) ×6 IMPLANT
TOWER CARTRIDGE SMART MIX (DISPOSABLE) IMPLANT
TRAY FOLEY W/METER SILVER 16FR (SET/KITS/TRAYS/PACK) ×3 IMPLANT
TUBE KAMVAC SUCTION (TUBING) IMPLANT
WATER STERILE IRR 1500ML POUR (IV SOLUTION) ×3 IMPLANT

## 2017-01-17 NOTE — Progress Notes (Signed)
To OR son present

## 2017-01-17 NOTE — Interval H&P Note (Signed)
History and Physical Interval Note:  01/17/2017 3:17 PM  Maureen Wilson  has presented today for surgery, with the diagnosis of failed left total hip  The various methods of treatment have been discussed with the patient and family. After consideration of risks, benefits and other options for treatment, the patient has consented to  Procedure(s): LEFT TOTAL HIP REVISION (Left) as a surgical intervention .  The patient's history has been reviewed, patient examined, no change in status, stable for surgery.  I have reviewed the patient's chart and labs.  Questions were answered to the patient's satisfaction.     Rebekkah Powless, Cloyde Reams

## 2017-01-17 NOTE — Significant Event (Signed)
Rapid Response Event Note  Overview: Time Called: 2130 Arrival Time: 2135 Event Type: Hypotension  Initial Focused Assessment:  Pt had just arrived from PACU to 1426 s/p Hip Surgery.  I was called by receiving primary nurse d/t BP on arrival to room was 53/39.  I advised by phone to start a 500 NS bolus and I was on the way.  On my arrival pt resting in bed, no distress signs of distress.  Pt able to verbally respond to my voice appropriately, alert to name, but thought she was at [facility] (which is where she had her last surgery per primary nurse).  A repeat BP by the RRT monitor resulted as 104/60.  No  obvious signs of bleeding.  PACU nurse still in room and advised minimal bleeding in surgery. Pt stable at this time.  I advised to go ahead to let the 500 NS bolus infuse.    Interventions:  500 NS bolus x 1  Plan of Care (if not transferred):  Pt at this time stable.  Primary nurse at this time feels comfortable with the patient's condition.  I advised primary nurse if anymore episodes of hypotension to call me back and also call the attending.  Event Summary:  See notes above.  No physician notified at this time / felt initial BP might not of been accurate  Outcome: Stayed in room and stabalized  Event End Time: 2140  Roque Cash [facility] ICU Care Coordinator / Rapid Response Nurse

## 2017-01-17 NOTE — Anesthesia Preprocedure Evaluation (Addendum)
Anesthesia Evaluation  Patient identified by MRN, date of birth, ID band Patient awake    Reviewed: Allergy & Precautions, NPO status , Patient's Chart, lab work & pertinent test results  Airway Mallampati: II       Dental  (+) Edentulous Lower, Edentulous Upper   Pulmonary    Pulmonary exam normal        Cardiovascular hypertension, Pt. on medications Normal cardiovascular exam Rhythm:Regular Rate:Normal     Neuro/Psych negative neurological ROS     GI/Hepatic negative GI ROS,   Endo/Other  Morbid obesity  Renal/GU Renal InsufficiencyRenal disease     Musculoskeletal  (+) Arthritis ,   Abdominal (+) + obese,   Peds  Hematology   Anesthesia Other Findings Study Result   Result status: Final result                             *I[facility]*                  *[facility]*                          [address]                        [place], [place] [address]                            [phone]  ------------------------------------------------------------------- Transthoracic Echocardiography  Patient:    Maureen Wilson, Maureen Wilson MR #:       [id number] Study Date: 01/17/2017 Gender:     F Age:        75 Height:     160 cm Weight:     103.4 kg BSA:        2.2 m^2 Pt. Status: Room:       1426   ADMITTING    Regalado, Belkys A  ORDERING     Regalado, Belkys A  REFERRING    Regalado, Belkys A  ATTENDING    Jerald Kief  PERFORMING   [facility], Inpatient  SONOGRAPHER  Chelsea Androw  cc:  ------------------------------------------------------------------- LV EF: 50% -   55%  ------------------------------------------------------------------- Indications:      Atrial fibrillation - 427.31.  ------------------------------------------------------------------- History:   PMH:   Congestive heart failure.  Chronic obstructive pulmonary  disease.  ------------------------------------------------------------------- Study Conclusions  - Left ventricle: The cavity size was normal. There was mild   concentric hypertrophy. Systolic function was normal. The   estimated ejection fraction was in the range of 50% to 55%. Wall   motion was normal; there were no regional wall motion   abnormalities. Features are consistent with a pseudonormal left   ventricular filling pattern, with concomitant abnormal relaxation   and increased filling pressure (grade 2 diastolic dysfunction). - Aortic valve: There was mild regurgitation. - Mitral valve: Moderately calcified annulus. Mildly thickened   leaflets . There was mild to moderate regurgitation. - Left atrium: The atrium was severely dilated. - Tricuspid valve: There was moderate regurgitation. - Pulmonary arteries: Systolic pressure was mildly increased. PA   peak pressure: 34 mm Hg (S).     Reproductive/Obstetrics                            Anesthesia Physical Anesthesia Plan  ASA: III  Anesthesia Plan: General   Post-op Pain Management:    Induction: Intravenous  Airway Management Planned: Oral ETT  Additional Equipment:   Intra-op Plan:   Post-operative Plan: Extubation in OR  Informed Consent: I have reviewed the patients History and Physical, chart, labs and discussed the procedure including the risks, benefits and alternatives for the proposed anesthesia with the patient or authorized representative who has indicated his/her understanding and acceptance.     Plan Discussed with: CRNA and Surgeon  Anesthesia Plan Comments:         Anesthesia Quick Evaluation

## 2017-01-17 NOTE — Progress Notes (Signed)
Placed patient on CPAP for the night with oxygen set at 5 LPM  

## 2017-01-17 NOTE — Anesthesia Procedure Notes (Signed)
Procedure Name: Intubation Date/Time: 01/17/2017 4:08 PM Performed by: Anne Fu Pre-anesthesia Checklist: Patient identified, Emergency Drugs available, Suction available and Patient being monitored Patient Re-evaluated:Patient Re-evaluated prior to inductionOxygen Delivery Method: Circle system utilized Preoxygenation: Pre-oxygenation with 100% oxygen Intubation Type: IV induction Ventilation: Mask ventilation without difficulty and Two handed mask ventilation required Laryngoscope Size: Mac and 4 Grade View: Grade I Tube type: Oral Tube size: 7.5 mm Number of attempts: 1 Airway Equipment and Method: Stylet and Oral airway Placement Confirmation: ETT inserted through vocal cords under direct vision,  positive ETCO2 and breath sounds checked- equal and bilateral Tube secured with: Tape Dental Injury: Teeth and Oropharynx as per pre-operative assessment

## 2017-01-17 NOTE — Progress Notes (Signed)
PROGRESS NOTE    Maureen Wilson  WUJ:[id number] DOB: 06/10/1941 DOA: 01/16/2017 PCP: Joanna Hews, MD    Brief Narrative:  76 y.o. female with medical history significant of PAF on coumadin, COPD on 2 L oxygen, CKD stage III, cr baseline 1.5, who presents complaining of left hip pain. Patient was walking with her walker when she felt a pop in her hip and fell down on her left hip. She denies chest pain, dyspnea, lightheaded prior to episode. She is having left hip pain, worse with movement. She is able to walk with her walker at home. Able to walk only 25 feet without dyspnea. She uses 2 L of oxygen.  She currently denies chest pain or dyspnea.    ED Course: WBC at 13, cr a.59, BUN 32, Co 2 33; k at 2.8. Left hip X ray: Left hip arthroplasty with anterior dislocation of the femoral head component relative to the acetabular cup. Chest x ray; cardiomegaly without failure.  Assessment & Plan:   Active Problems:   Fall   Closed dislocation of left hip (HCC)   A-fib (HCC)   Chronic diastolic CHF (congestive heart failure) (HCC)   COPD with chronic bronchitis (HCC)  1-Left hip closed dislocation, hardware.  Patient high risk for procedure due to multiples comorbidity, CKD, HF, A fib, COPD on oxygen.  EKG A fib.  Chest x ray cardiomegaly.  2d echo with normal LVEF, grade 2 diastolic dysfunction Patient for OR today. Orthopedic Surgery following  2-hypokalemia;  Will correct Repeat bmet in AM.   3-HF, suspect diastolic. Unclear if systolic.  Will ordered ECHO pre -op. Reviewed per above Hold diuretics overnight prior to procedure. Resume diuretics post sx.  Cont beta blocker per home regimen  4-A fib; hold coumadin pre op.  Continue with Cardizem and Atenol.  Will need to resume coumadin when ok by Ortho.  -stable at present  5-Hypothyroidism; continue with synthroid  -Currently stable  6-Leukocytosis; follow trend.  Suspect stress related.   -Resolved  7-COPD; continue with nebulizer, and Breo.  CPAP at bedtime. -Currently stable   8-anxiety; will continue klonopin PRN.   DVT prophylaxis: SCD's Code Status: DNR Family Communication: Pt in room, family not at bedside Disposition Plan: Uncertain at this time  Consultants:   Orthopedic surgery  Procedures:     Antimicrobials: Anti-infectives    Start     Dose/Rate Route Frequency Ordered Stop   01/17/17 0600  ceFAZolin (ANCEF) IVPB 2g/100 mL premix     2 g 200 mL/hr over 30 Minutes Intravenous On call to O.R. 01/16/17 1839 01/18/17 0559   01/17/17 0600  vancomycin (VANCOCIN) 1,500 mg in sodium chloride 0.9 % 500 mL IVPB     1,500 mg 250 mL/hr over 120 Minutes Intravenous On call to O.R. 01/16/17 1839 01/18/17 0559       Subjective: No complaints at present  Objective: Vitals:   01/16/17 2237 01/17/17 0646 01/17/17 0808 01/17/17 1410  BP:  (!) 114/57 103/66 (!) 102/42  Pulse:  82 87 87  Resp: Temp:  98.1 F (36.7 C) 98.2 F (36.8 C) 98.2 F (36.8 C)  TempSrc:  Oral Oral Oral  SpO2:  96% 95% 95%  Weight:      Height:        Intake/Output Summary (Last 24 hours) at 01/17/17 1654 Last data filed at 01/17/17 1511  Gross per 24 hour  Intake  270 ml  Output              750 ml  Net             -480 ml   Filed Weights   01/16/17 1506  Weight: 103.4 kg (228 lb)    Examination:  General exam: Appears calm and comfortable  Respiratory system: Clear to auscultation. Respiratory effort normal. Cardiovascular system: S1 & S2 heard, RRR. No JVD, murmurs, rubs, gallops or clicks. No pedal edema. Gastrointestinal system: Abdomen is nondistended, soft and nontender. No organomegaly or masses felt. Normal bowel sounds heard. Central nervous system: Alert and oriented. No focal neurological deficits. Extremities: Symmetric 5 x 5 power. Skin: No rashes, lesions Psychiatry: Judgement and insight appear normal. Mood & affect  appropriate.   Data Reviewed: I have personally reviewed following labs and imaging studies  CBC:  Recent Labs Lab 01/16/17 1312 01/17/17 0606  WBC 13.1* 8.7  NEUTROABS 10.4*  --   HGB 11.8* 11.2*  HCT 36.1 34.6*  MCV 91.9 92.5  PLT 212 191   Basic Metabolic Panel:  Recent Labs Lab 01/16/17 1312 01/17/17 0606  NA 135 134*  K 2.8* 3.0*  CL 90* 91*  CO2 33* 33*  GLUCOSE 113* 86  BUN 32* 30*  CREATININE 1.59* 1.54*  CALCIUM 9.3 8.9   GFR: Estimated Creatinine Clearance: 36.3 mL/min (A) (by C-G formula based on SCr of 1.54 mg/dL (H)). Liver Function Tests: No results for input(s): AST, ALT, ALKPHOS, BILITOT, PROT, ALBUMIN in the last 168 hours. No results for input(s): LIPASE, AMYLASE in the last 168 hours. No results for input(s): AMMONIA in the last 168 hours. Coagulation Profile:  Recent Labs Lab 01/16/17 1312 01/17/17 0843  INR 1.71 1.61   Cardiac Enzymes: No results for input(s): CKTOTAL, CKMB, CKMBINDEX, TROPONINI in the last 168 hours. BNP (last 3 results) No results for input(s): PROBNP in the last 8760 hours. HbA1C: No results for input(s): HGBA1C in the last 72 hours. CBG: No results for input(s): GLUCAP in the last 168 hours. Lipid Profile: No results for input(s): CHOL, HDL, LDLCALC, TRIG, CHOLHDL, LDLDIRECT in the last 72 hours. Thyroid Function Tests: No results for input(s): TSH, T4TOTAL, FREET4, T3FREE, THYROIDAB in the last 72 hours. Anemia Panel: No results for input(s): VITAMINB12, FOLATE, FERRITIN, TIBC, IRON, RETICCTPCT in the last 72 hours. Sepsis Labs: No results for input(s): PROCALCITON, LATICACIDVEN in the last 168 hours.  Recent Results (from the past 240 hour(s))  MRSA PCR Screening     Status: Abnormal   Collection Time: 01/16/17 10:58 PM  Result Value Ref Range Status   MRSA by PCR POSITIVE (A) NEGATIVE Final    Comment:        The GeneXpert MRSA Assay (FDA approved for NASAL specimens only), is one component of  a comprehensive MRSA colonization surveillance program. It is not intended to diagnose MRSA infection nor to guide or monitor treatment for MRSA infections. RESULT CALLED TO, READ BACK BY AND VERIFIED WITH: CERIC,B RN 4.12.18  ZANDO,C   Surgical pcr screen     Status: Abnormal   Collection Time: 01/17/17  6:22 AM  Result Value Ref Range Status   MRSA, PCR POSITIVE (A) NEGATIVE Final    Comment: RESULT CALLED TO, READ BACK BY AND VERIFIED WITH: JOHNSON,A @ 1226 ON [id number] BY POTEAT,S    Staphylococcus aureus POSITIVE (A) NEGATIVE Final    Comment:        The Xpert SA Assay (FDA approved for  NASAL specimens in patients over 45 years of age), is one component of a comprehensive surveillance program.  Test performance has been validated by [facility] for patients greater than or equal to 39 year old. It is not intended to diagnose infection nor to guide or monitor treatment.      Radiology Studies: Dg Chest Port 1 View  Result Date: 01/16/2017 CLINICAL DATA:  Preop for left hip surgery.  Emphysema. EXAM: PORTABLE CHEST 1 VIEW COMPARISON:  Two-view chest x-ray 01/18/2016 FINDINGS: The heart size is exaggerate by low lung volumes. Atherosclerotic calcifications are present at the aortic arch. There is no edema or effusion. No focal airspace disease is present. Degenerative changes are noted at the shoulders bilaterally. IMPRESSION: 1. Low lung volumes. 2. Cardiomegaly without failure. 3. Aortic atherosclerosis. Electronically Signed   By: Marin Roberts M.D.   On: 01/16/2017 17:37   Dg Hip Unilat With Pelvis 2-3 Views Left  Result Date: 01/16/2017 CLINICAL DATA:  Fall, left hip pain EXAM: DG HIP (WITH OR WITHOUT PELVIS) 2-3V LEFT COMPARISON:  None. FINDINGS: Right hip arthroplasty in satisfactory position. Left hip arthroplasty with anterior dislocation of the femoral head component relative to the acetabular cup. No fracture is seen. Degenerative changes of the lower  lumbar spine. Vascular calcifications. Right inguinal hernia mesh repair. IMPRESSION: Left hip arthroplasty with anterior dislocation of the femoral head component relative to the acetabular cup. Electronically Signed   By: Charline Bills M.D.   On: 01/16/2017 13:05    Scheduled Meds: . acetaminophen  1,000 mg Intravenous To OR  . [MAR Hold] aspirin EC  81 mg Oral Daily  . [MAR Hold] atenolol  25 mg Oral Daily  . [MAR Hold] atorvastatin  20 mg Oral QHS  .  ceFAZolin (ANCEF) IV  2 g Intravenous On Call to OR  . [MAR Hold] Chlorhexidine Gluconate Cloth  6 each Topical Q0600  . [MAR Hold] cholecalciferol  2,000 Units Oral Daily  . [MAR Hold] diltiazem  180 mg Oral Daily  . [MAR Hold] docusate sodium  100 mg Oral BID  . [MAR Hold] escitalopram  20 mg Oral Daily  . [MAR Hold] fluticasone furoate-vilanterol  1 puff Inhalation Daily  . [MAR Hold] levothyroxine  50 mcg Oral Once per day on Sun Tue Wed Fri Sat  . [MAR Hold] levothyroxine  75 mcg Oral Once per day on Mon Thu  . [MAR Hold] montelukast  10 mg Oral QHS  . [MAR Hold] multivitamin with minerals  1 tablet Oral Daily  . [MAR Hold] mupirocin ointment  1 application Nasal BID  . [MAR Hold] pantoprazole  40 mg Oral Daily  . [MAR Hold] potassium chloride  40 mEq Oral BID  . [MAR Hold] predniSONE  5 mg Oral Q breakfast  . [MAR Hold] senna  1 tablet Oral BID  . [MAR Hold] sodium chloride flush  3 mL Intravenous Q12H  . [MAR Hold] tamsulosin  0.4 mg Oral Daily  . tranexamic acid  1,000 mg Intravenous To OR  . vancomycin  1,500 mg Intravenous On Call to OR   Continuous Infusions: . dextrose 5 % and 0.9% NaCl Stopped (01/17/17 1500)  . lactated ringers 75 mL/hr at 01/17/17 1545     LOS: 1 day   CHIU, Scheryl Marten, MD [facility] Pager [phone]  If 7PM-7AM, please contact night-coverage [email or web address] Password [facility] 01/17/2017, 4:54 PM

## 2017-01-17 NOTE — Progress Notes (Signed)
  Echocardiogram 2D Echocardiogram has been performed. Patient experienced severe pain while in apical region due to irritation under left breast. Alternate positioning of probe was provided in order to obtain images. Patient appeared anxious and uncomfortable during length of exam and requested I finish quickly.   Maureen Wilson L Androw 01/17/2017, 9:10 AM

## 2017-01-17 NOTE — H&P (View-Only) (Signed)
Reason for Consult:dislocated left total hip replacement Referring Physician: EDP  Maureen Wilson is an 76 y.o. female.  HPI: 76 yo female who presents with acute left hip pain after experiencing first a "loud pop" in her left hip and then falling to the ground and landing on her left side.  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  She has not had any issues with this hip prior to today. After fall unable to stand due to hip pain.  Pain now with any hip movement.   Past Medical History:  Diagnosis Date  . Asthma   . Atrial fibrillation ([place])   . Back pain   . Bronchitis    hx of  . CHF (congestive heart failure) ([place])   . Chronic kidney disease   . Chronic respiratory failure ([place])   . COPD (chronic obstructive pulmonary disease) ([place])   . Emphysema lung ([place])   . Hypertension     Past Surgical History:  Procedure Laterality Date  . ABDOMINAL HYSTERECTOMY    . PARTIAL HIP ARTHROPLASTY Right   . TOTAL HIP ARTHROPLASTY Left     Family History  Problem Relation Age of Onset  . Hypertension Son     Social History:  reports that she has never smoked. She has never used smokeless tobacco. She reports that she does not drink alcohol or use drugs.  Allergies:  Allergies  Allergen Reactions  . Amlodipine Swelling and Other (See Comments)    Reaction:  Lip/mouth swelling   . Hydralazine Swelling and Other (See Comments)    Reaction:  Lip/mouth swelling   . Iohexol Hives and Other (See Comments)    Desc: Pt developed hives after receiving iv dye for ct scan.  suggest she be premedicated for future exams, Onset Date: [id number]   . Metoprolol Swelling and Other (See Comments)    Reaction:  Lip/mouth swelling   . Olmesartan Swelling and Other (See Comments)    Reaction:  Lip/mouth swelling   . Ramipril Swelling and Other (See Comments)    Reaction:  Lip/mouth swelling   . Sulfa Antibiotics Swelling and Other (See Comments)   Reaction:  Lip/mouth swelling   . Telmisartan Swelling and Other (See Comments)    Reaction:  Lip/mouth swelling   . Tiotropium Swelling and Other (See Comments)    Reaction:  Lip/mouth swelling     Medications: I have reviewed the patient's current medications.  Results for orders placed or performed during the hospital encounter of 01/16/17 (from the past 48 hour(s))  Basic metabolic panel     Status: Abnormal   Collection Time: 01/16/17  1:12 PM  Result Value Ref Range   Sodium 135 135 - 145 mmol/L   Potassium 2.8 (L) 3.5 - 5.1 mmol/L   Chloride 90 (L) 101 - 111 mmol/L   CO2 33 (H) 22 - 32 mmol/L   Glucose, Bld 113 (H) 65 - 99 mg/dL   BUN 32 (H) 6 - 20 mg/dL   Creatinine, Ser 1.59 (H) 0.44 - 1.00 mg/dL   Calcium 9.3 8.9 - 10.3 mg/dL   GFR calc non Af Amer 31 (L) >60 mL/min   GFR calc Af Amer 36 (L) >60 mL/min    Comment: (NOTE) The eGFR has been calculated using the CKD EPI equation. This calculation has not been validated in all clinical situations. eGFR's persistently <60 mL/min signify possible Chronic Kidney Disease.    Anion gap 12 5 - 15  CBC with Differential     Status: Abnormal   Collection Time: 01/16/17  1:12 PM  Result Value Ref Range   WBC 13.1 (H) 4.0 - 10.5 K/uL   RBC 3.93 3.87 - 5.11 MIL/uL   Hemoglobin 11.8 (L) 12.0 - 15.0 g/dL   HCT 36.1 36.0 - 46.0 %   MCV 91.9 78.0 - 100.0 fL   MCH 30.0 26.0 - 34.0 pg   MCHC 32.7 30.0 - 36.0 g/dL   RDW 14.8 11.5 - 15.5 %   Platelets 212 150 - 400 K/uL   Neutrophils Relative % 79 %   Neutro Abs 10.4 (H) 1.7 - 7.7 K/uL   Lymphocytes Relative 12 %   Lymphs Abs 1.6 0.7 - 4.0 K/uL   Monocytes Relative 8 %   Monocytes Absolute 1.0 0.1 - 1.0 K/uL   Eosinophils Relative 1 %   Eosinophils Absolute 0.1 0.0 - 0.7 K/uL   Basophils Relative 0 %   Basophils Absolute 0.0 0.0 - 0.1 K/uL  Protime-INR     Status: Abnormal   Collection Time: 01/16/17  1:12 PM  Result Value Ref Range   Prothrombin Time 20.3 (H) 11.4 - 15.2  seconds   INR 1.71     Dg Hip Unilat With Pelvis 2-3 Views Left  Result Date: 01/16/2017 CLINICAL DATA:  Fall, left hip pain EXAM: DG HIP (WITH OR WITHOUT PELVIS) 2-3V LEFT COMPARISON:  None. FINDINGS: Right hip arthroplasty in satisfactory position. Left hip arthroplasty with anterior dislocation of the femoral head component relative to the acetabular cup. No fracture is seen. Degenerative changes of the lower lumbar spine. Vascular calcifications. Right inguinal hernia mesh repair. IMPRESSION: Left hip arthroplasty with anterior dislocation of the femoral head component relative to the acetabular cup. Electronically Signed   By: Julian Hy M.D.   On: 01/16/2017 13:05    ROS Blood pressure 139/77, pulse 100, temperature 98.1 F (36.7 C), temperature source Oral, resp. rate 16, height '5\' 3"'  (1.6 m), weight 103.4 kg (228 lb), SpO2 97 %. Physical Exam  Patient laying on ER stretcher in moderate distress.  Pain with any left hip movement. Left leg shorter than right. Knee is nontender with no deformity. Able to move her ankle and sensation and pulses are intact to the left LE. 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

## 2017-01-17 NOTE — Brief Op Note (Signed)
01/17/2017  7:31 PM  PATIENT:  Berle Mull  76 y.o. female  PRE-OPERATIVE DIAGNOSIS:  failed left total hip  POST-OPERATIVE DIAGNOSIS:  failed left total hip  PROCEDURE:  Procedure(s): LEFT TOTAL HIP REVISION (Left)  SURGEON:  Surgeon(s) and Role:    * Samson Frederic, MD - Primary  PHYSICIAN ASSISTANT: None  ASSISTANTS: none   ANESTHESIA:   local and general  EBL:  Total I/O In: 335 [Blood:335] Out: -   BLOOD ADMINISTERED: 2 units PRBCs  DRAINS: (1 medium) Hemovact drain(s) in the left hip with  Suction Open   LOCAL MEDICATIONS USED:  MARCAINE     SPECIMEN:  No Specimen  DISPOSITION OF SPECIMEN:  N/A  COUNTS:  YES  TOURNIQUET:  * No tourniquets in log *  DICTATION: .Other Dictation: Dictation Number [id number]  PLAN OF CARE: Admit to inpatient   PATIENT DISPOSITION:  PACU - hemodynamically stable.   Delay start of Pharmacological VTE agent (>24hrs) due to surgical blood loss or risk of bleeding: no

## 2017-01-17 NOTE — Progress Notes (Signed)
ANTICOAGULATION CONSULT NOTE - Initial Consult  Pharmacy Consult for Coumadin Indication: atrial fibrillation  Allergies  Allergen Reactions  . Amlodipine Swelling and Other (See Comments)    Reaction:  Lip/mouth swelling   . Hydralazine Swelling and Other (See Comments)    Reaction:  Lip/mouth swelling   . Iohexol Hives and Other (See Comments)    Desc: Pt developed hives after receiving iv dye for ct scan.  suggest she be premedicated for future exams, Onset Date: [id number]   . Metoprolol Swelling and Other (See Comments)    Reaction:  Lip/mouth swelling   . Olmesartan Swelling and Other (See Comments)    Reaction:  Lip/mouth swelling   . Ramipril Swelling and Other (See Comments)    Reaction:  Lip/mouth swelling   . Sulfa Antibiotics Swelling and Other (See Comments)    Reaction:  Lip/mouth swelling   . Telmisartan Swelling and Other (See Comments)    Reaction:  Lip/mouth swelling   . Tiotropium Swelling and Other (See Comments)    Reaction:  Lip/mouth swelling     Patient Measurements: Height:  (160 cm) Weight: 228 lb (103.4 kg) IBW/kg (Calculated) : 52.4  Vital Signs: Temp: 97.6 F (36.4 C) (04/12 2045) Temp Source: Oral (04/12 1410) BP: 104/60 (04/12 2133) Pulse Rate: 79 (04/12 2045)  Labs:  Recent Labs  01/16/17 1312 01/17/17 0606 01/17/17 0843  HGB 11.8* 11.2*  --   HCT 36.1 34.6*  --   PLT 212 191  --   LABPROT 20.3*  --  19.4*  INR 1.71  --  1.61  CREATININE 1.59* 1.54*  --     Estimated Creatinine Clearance: 36.3 mL/min (A) (by C-G formula based on SCr of 1.54 mg/dL (H)).   Medical History: Past Medical History:  Diagnosis Date  . Asthma   . Atrial fibrillation (HCC)   . Back pain   . Bronchitis    hx of  . CHF (congestive heart failure) (HCC)   . Chronic kidney disease   . Chronic respiratory failure (HCC)   . COPD (chronic obstructive pulmonary disease) (HCC)   . Emphysema lung (HCC)   . Hypertension     Medications:   Coumadin  daily except  on Tues/Thur/Sat.  LD 4/10 at 1930.  Assessment: 76 yo F on chronic Coumadin for Afib.  INR 1.71 on admission.  She received Vit K  to reverse INR for surgery.  S/P left total hip revision tonight. Pharmacy consulted to resume Coumadin POD#0.  01/17/2017:  INR 1.61 today   CBC: Hg slightly low at baseline (11.2), Pltc WNL  Anticipate post-op anemia.  No acute bleeding issues reported.    No major drug-drug interactions   Goal of Therapy:  INR 2-3 Monitor platelets by anticoagulation protocol: Yes   Plan:  Coumadin 2.5mg  po x1 tonight Daily INR Lovenox  sq q24h until INR >2 per MD Monitor for s/sx of bleeding  Elson Clan 01/17/2017,9:36 PM

## 2017-01-17 NOTE — Transfer of Care (Signed)
Immediate Anesthesia Transfer of Care Note  Patient: Maureen Wilson  Procedure(s) Performed: Procedure(s): LEFT TOTAL HIP REVISION (Left)  Patient Location: PACU  Anesthesia Type:General  Level of Consciousness: sedated, patient cooperative and responds to stimulation  Airway & Oxygen Therapy: Patient Spontanous Breathing and Patient connected to face mask oxygen  Post-op Assessment: Report given to RN and Post -op Vital signs reviewed and stable  Post vital signs: Reviewed and stable  Last Vitals:  Vitals:   01/17/17 0808 01/17/17 1410  BP: 103/66 (!) 102/42  Pulse: 87 87  Resp: 18 16  Temp: 36.8 C 36.8 C    Last Pain:  Vitals:   01/17/17 1410  TempSrc: Oral  PainSc:       Patients Stated Pain Goal: 5 (01/16/17 2025)  Complications: No apparent anesthesia complications

## 2017-01-17 NOTE — Progress Notes (Signed)
CRITICAL VALUE ALERT  Critical value received:  +MRSA  Date of notification:  01/17/17  Time of notification:  1227  Critical value read back:yes Nurse who received alert:  April Royanne Warshaw   MD notified (1st page):  Rhona Leavens  Time of first page:  1228  MD notified (2nd page):  Time of second page:  Responding MD:  Rhona Leavens  Time MD responded:  1229

## 2017-01-17 NOTE — Discharge Instructions (Signed)
°Dr. Nygeria Lager °Joint Replacement Specialist °[facility] °[address] °[place], [place] [address] °[phone] ° ° °TOTAL HIP REPLACEMENT POSTOPERATIVE DIRECTIONS ° ° ° °Hip Rehabilitation, Guidelines Following Surgery  ° °WEIGHT BEARING °Weight bearing as tolerated with assist device (walker, cane, etc) as directed, use it as long as suggested by your surgeon or therapist, typically at least 4-6 weeks. ° °The results of a hip operation are greatly improved after range of motion and muscle strengthening exercises. Follow all safety measures which are given to protect your hip. If any of these exercises cause increased pain or swelling in your joint, decrease the amount until you are comfortable again. Then slowly increase the exercises. Call your caregiver if you have problems or questions.  ° °HOME CARE INSTRUCTIONS  °Most of the following instructions are designed to prevent the dislocation of your new hip.  °Remove items at home which could result in a fall. This includes throw rugs or furniture in walking pathways.  °Continue medications as instructed at time of discharge. °· You may have some home medications which will be placed on hold until you complete the course of blood thinner medication. °· You may start showering once you are discharged home. Do not remove your dressing. °Do not put on socks or shoes without following the instructions of your caregivers.   °Sit on chairs with arms. Use the chair arms to help push yourself up when arising.  °Arrange for the use of a toilet seat elevator so you are not sitting low.  °· Walk with walker as instructed.  °You may resume a sexual relationship in one month or when given the OK by your caregiver.  °Use walker as long as suggested by your caregivers.  °You may put full weight on your legs and walk as much as is comfortable. °Avoid periods of inactivity such as sitting longer than an hour when not asleep. This helps prevent  blood clots.  °You may return to work once you are cleared by your surgeon.  °Do not drive a car for 6 weeks or until released by your surgeon.  °Do not drive while taking narcotics.  °Wear elastic stockings for two weeks following surgery during the day but you may remove then at night.  °Make sure you keep all of your appointments after your operation with all of your doctors and caregivers. You should call the office at the above phone number and make an appointment for approximately two weeks after the date of your surgery. °Please pick up a stool softener and laxative for home use as long as you are requiring pain medications. °· ICE to the affected hip every three hours for 30 minutes at a time and then as needed for pain and swelling. Continue to use ice on the hip for pain and swelling from surgery. You may notice swelling that will progress down to the foot and ankle.  This is normal after surgery.  Elevate the leg when you are not up walking on it.   °It is important for you to complete the blood thinner medication as prescribed by your doctor. °· Continue to use the breathing machine which will help keep your temperature down.  It is common for your temperature to cycle up and down following surgery, especially at night when you are not up moving around and exerting yourself.  The breathing machine keeps your lungs expanded and your temperature down. ° °RANGE OF MOTION AND STRENGTHENING EXERCISES  °These exercises are   designed to help you keep full movement of your hip joint. Follow your caregiver's or physical therapist's instructions. Perform all exercises about fifteen times, three times per day or as directed. Exercise both hips, even if you have had only one joint replacement. These exercises can be done on a training (exercise) mat, on the floor, on a table or on a bed. Use whatever works the best and is most comfortable for you. Use music or television while you are exercising so that the exercises  are a pleasant break in your day. This will make your life better with the exercises acting as a break in routine you can look forward to.  °Lying on your back, slowly slide your foot toward your buttocks, raising your knee up off the floor. Then slowly slide your foot back down until your leg is straight again.  °Lying on your back spread your legs as far apart as you can without causing discomfort.  °Lying on your side, raise your upper leg and foot straight up from the floor as far as is comfortable. Slowly lower the leg and repeat.  °Lying on your back, tighten up the muscle in the front of your thigh (quadriceps muscles). You can do this by keeping your leg straight and trying to raise your heel off the floor. This helps strengthen the largest muscle supporting your knee.  °Lying on your back, tighten up the muscles of your buttocks both with the legs straight and with the knee bent at a comfortable angle while keeping your heel on the floor.  ° °SKILLED REHAB INSTRUCTIONS: °If the patient is transferred to a skilled rehab facility following release from the hospital, a list of the current medications will be sent to the facility for the patient to continue.  When discharged from the skilled rehab facility, please have the facility set up the patient's Home Health Physical Therapy prior to being released. Also, the skilled facility will be responsible for providing the patient with their medications at time of release from the facility to include their pain medication and their blood thinner medication. If the patient is still at the rehab facility at time of the two week follow up appointment, the skilled rehab facility will also need to assist the patient in arranging follow up appointment in our office and any transportation needs. ° °MAKE SURE YOU:  °Understand these instructions.  °Will watch your condition.  °Will get help right away if you are not doing well or get worse. ° °Pick up stool softner and  laxative for home use following surgery while on pain medications. °Do not remove your dressing. °The dressing is waterproof--it is OK to take showers. °Continue to use ice for pain and swelling after surgery. °Do not use any lotions or creams on the incision until instructed by your surgeon. °Total Hip Protocol. ° ° °

## 2017-01-18 ENCOUNTER — Other Ambulatory Visit: Payer: Self-pay | Admitting: Orthopedic Surgery

## 2017-01-18 ENCOUNTER — Encounter (HOSPITAL_COMMUNITY): Payer: Self-pay | Admitting: Orthopedic Surgery

## 2017-01-18 DIAGNOSIS — E876 Hypokalemia: Secondary | ICD-10-CM

## 2017-01-18 DIAGNOSIS — W19XXXA Unspecified fall, initial encounter: Secondary | ICD-10-CM

## 2017-01-18 LAB — CBC
HEMATOCRIT: 36.2 % (ref 36.0–46.0)
HEMOGLOBIN: 11.8 g/dL — AB (ref 12.0–15.0)
MCH: 30.1 pg (ref 26.0–34.0)
MCHC: 32.6 g/dL (ref 30.0–36.0)
MCV: 92.3 fL (ref 78.0–100.0)
Platelets: 144 10*3/uL — ABNORMAL LOW (ref 150–400)
RBC: 3.92 MIL/uL (ref 3.87–5.11)
RDW: 16.6 % — AB (ref 11.5–15.5)
WBC: 12.4 10*3/uL — ABNORMAL HIGH (ref 4.0–10.5)

## 2017-01-18 LAB — BASIC METABOLIC PANEL
ANION GAP: 10 (ref 5–15)
BUN: 29 mg/dL — ABNORMAL HIGH (ref 6–20)
CHLORIDE: 97 mmol/L — AB (ref 101–111)
CO2: 28 mmol/L (ref 22–32)
Calcium: 8 mg/dL — ABNORMAL LOW (ref 8.9–10.3)
Creatinine, Ser: 2.08 mg/dL — ABNORMAL HIGH (ref 0.44–1.00)
GFR calc non Af Amer: 22 mL/min — ABNORMAL LOW (ref >60)
GFR, EST AFRICAN AMERICAN: 26 mL/min — AB (ref >60)
GLUCOSE: 114 mg/dL — AB (ref 65–99)
POTASSIUM: 4.1 mmol/L (ref 3.5–5.1)
Sodium: 135 mmol/L (ref 135–145)

## 2017-01-18 LAB — BPAM RBC
BLOOD PRODUCT EXPIRATION DATE: 201805022359
BLOOD PRODUCT EXPIRATION DATE: 201805032359
ISSUE DATE / TIME: 201804121754
ISSUE DATE / TIME: 201804121754
Unit Type and Rh: 5100
Unit Type and Rh: 5100

## 2017-01-18 LAB — TYPE AND SCREEN
ABO/RH(D): O POS
Antibody Screen: NEGATIVE
UNIT DIVISION: 0
Unit division: 0

## 2017-01-18 LAB — PROTIME-INR
INR: 1.7
Prothrombin Time: 20.2 seconds — ABNORMAL HIGH (ref 11.4–15.2)

## 2017-01-18 MED ORDER — WARFARIN SODIUM 2.5 MG PO TABS
2.5000 mg | ORAL_TABLET | Freq: Once | ORAL | Status: AC
  Administered 2017-01-18: 2.5 mg via ORAL
  Filled 2017-01-18: qty 1

## 2017-01-18 NOTE — Op Note (Signed)
NAME:  Maureen Wilson, Maureen Wilson NO.:  MEDICAL RECORD NO.:  [id number]  LOCATION:                                 FACILITY:  PHYSICIAN:  Samson Frederic, MD     DATE OF BIRTH:  [birth year]  DATE OF PROCEDURE:  01/17/2017 DATE OF DISCHARGE:                              OPERATIVE REPORT   SURGEON:  Samson Frederic, MD  ASSISTANT:  Staff.  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  PROCEDURE PERFORMED:  Revision left total hip arthroplasty including femoral and acetabular components.  EXPLANTS: 1. DePuy SROM femoral stem, 36 standard +12, 18 x 13 x 160 mm 2. 52 +4 constrained liner. 2. 32+ 3 metal head ball.  IMPLANTS: 1. SROM femoral stem, standard 36+ 12, 18 x 13 x 160 mm. 2. SROM femoral head, 32+ 3 mm. 3. 32 x 52 +4 neutral constrained liner.  EBL:  800 mL.  ANTIBIOTICS:  1.5 g vancomycin.  SPECIMENS:  None.  TUBES AND DRAINS: 1. Medium Hemovac in the left hip x1. 2. Prevena incisional wound VAC.  DISPOSITION:  Stable to PACU.  INDICATIONS:  The patient is a 76 year old female, who underwent primary left total hip arthroplasty about 7 years ago by Dr. Lequita Halt for femoral neck fracture.  About a year later, she underwent revision to a constrained liner secondary to persistent instability.  The patient was doing very well until yesterday when her hip popped and she fell.  She was brought to the emergency department, x-rays revealed dislocated left total hip arthroplasty without any periprosthetic fractures.  The constrained liner was intact.  She was admitted to the Hospitalist Service.  She underwent perioperative risk stratification, medical optimization.  Risks, benefits and alternatives to the above-mentioned procedure were explained.  She was elected to proceed.  DESCRIPTION OF PROCEDURE IN DETAIL:  I identified  the patient in the holding area using two identifiers.  She was taken to the operating room.  General anesthesia was induced.  Foley catheter was placed.  She was positioned on a peg board.  All bony prominences were well padded. Axillary roll was placed.  Left hip was prepped and draped in normal sterile surgical fashion.  Time-out was called verifying the side and site of surgery.  She did receive IV antibiotics within 60 minutes at the beginning of the procedure.  I used a #10 blade to excise her previous scar, I created full-thickness skin flaps.  The IT band was split in line with fibers.  Upon entering the hip itself, she did have a significant amount of metallic staining to the tissues.  The hip was dislocated, the head was buttonholed posteriorly.  I was able to carefully dissect the head out, the sciatic nerve was palpated and protected throughout the case.  She had a large area of wear to the posterior neck just distal to where the trunnion is.  I used the bone tamp, I removed the head.  I examined the acetabular liner, she had fractured the liner posteriorly, which had allowed the head to escape, the ring was intact.  Therefore, I determined that she was impinging the posterior neck on the locking ring.  I used an osteotome and a curette to free the shoulder of the implant. I decided to revise the stem in order to reduce the amount of anteversion thus prevent impingement.  I used osteotome to loosen the interface of the sleeve and the stem.  I used a loop extractor to completely remove the stem.  The femur was then retracted anteriorly.  I sharply excised all the scar 360-degree circumferentially around the cup.  I removed the locking ring, the liner was very difficult to remove.  I did sew a piecemeal fashion with osteotomes, the cup was extremely stable, the position was anatomic. Therefore, I placed a new acetabular +4 neutral constrained liner.  I turned my attention to the  proximal femur, I reamed up to a 13.5, I placed a real 13 stem.  I included about 15 degrees of anteversion whereas before she had at least 45 degrees.  The stem was impacted.  I placed the real +3 head, which is exactly the same head as before.  The head was seated within the liner, the locking ring was impacted down, the hip was extremely stable.  There was no impingement.  In full extension and maximum external rotation, there was no contact between the neck and the locking ring.  I was very pleased with the results.  I did use a Bovie electrocautery to very carefully excise periarticular tissues that were heavily stained with metallic debris.  Again, the sciatic nerve was palpated, protected throughout the entirety of the case.  The hip was copiously irrigated with saline.  Marcaine solution was injected to the periarticular tissues.  I closed the IT band over a medium Hemovac drain with #1 Vicryl and #1 Stratafix sutures.  I performed a multiple-layer closure; 0 Vicryl deep fat, 2-0 Vicryl intermediate fat, 2-0 Monocryl deep dermal layer.  Her skin was very thin and friable.  Therefore, I deemed that subcuticular Monocryl would not be adequate.  I stapled the skin.  I then placed a Prevena incisional wound VAC according to manufacture's instructions.  She was then flipped supine, extubated, taken to PACU in stable condition.  She did receive 2 units of packed red blood cells intraop due to her multiple medical comorbidities.  There were no complications.  Postoperatively, we will readmit the patient to the Hospitalist.  She will weightbear as tolerated with a walker.  Maintain posterior hip precautions.  No active abduction for 6 weeks.  We will resume her Coumadin with a Lovenox bridge.  I will need to see her in the office 7 days from the date of surgery in order to remove her Prevena VAC, this was communicated to the family.  She will work with physical therapy, have disposition  planning.          ______________________________ Samson Frederic, MD     BS/MEDQ  D:  01/17/2017  T:  01/18/2017  Job:  [id number]

## 2017-01-18 NOTE — Progress Notes (Signed)
Pt selected [facility] for HH needs. Referral given to in house rep.  

## 2017-01-18 NOTE — Evaluation (Signed)
Physical Therapy Evaluation Patient Details Name: Maureen Wilson MRN: [id number] DOB: 1941-03-07 Today's Date: 01/18/2017   History of Present Illness  s/p L THA revision; PMHx: COPD, CHF, obesity, afib, R TKA, L THA, chronic O2  Clinical Impression  Pt admitted with above diagnosis. Pt currently with functional limitations due to the deficits listed below (see PT Problem List).  Pt will benefit from skilled PT to increase their independence and safety with mobility to allow discharge to the venue listed below.  Pt states she does not want to go to SNF, desires to go home; will continue to follow and monitor progress over weekend;  Pt will have assist from her sister-in-law for ADLS, son also able to assist, house is handicapped accessible;     Follow Up Recommendations Home health PT;Supervision/Assistance - 24 hour (pt desires to go home, declining SNF at this time)    Equipment Recommendations  None recommended by PT    Recommendations for Other Services       Precautions / Restrictions Precautions Precautions: Posterior Hip;Other (comment) Precaution Comments: NO ACTIVE ABD Restrictions Weight Bearing Restrictions: No LLE Weight Bearing: Weight bearing as tolerated      Mobility  Bed Mobility Overal bed mobility: Needs Assistance Bed Mobility: Supine to Sit     Supine to sit: +2 for physical assistance;Mod assist;+2 for safety/equipment     General bed mobility comments: assist for RLE (no active abd), scooting, to elevate trunk; cues for technique and THP  Transfers Overall transfer level: Needs assistance Equipment used: Rolling walker (2 wheeled) Transfers: Sit to/from [employer] Sit to Stand: Min assist;+2 safety/equipment;+2 physical assistance Stand pivot transfers: Min assist       General transfer comment: cues for LLE position, hand placement; assist to rise and stabilize; step by step cues for safe technique for stand  pivot  Ambulation/Gait                Stairs            Wheelchair Mobility    Modified Rankin (Stroke Patients Only)       Balance                                             Pertinent Vitals/Pain Pain Assessment: No/denies pain (asking for pain meds--RN notified earlier)    Home Living Family/patient expects to be discharged to:: Private residence Living Arrangements: Spouse/significant other Available Help at Discharge: Family;Available 24 hours/day Type of Home: House Home Access: Ramped entrance     Home Layout: One level Home Equipment: Adaptive equipment Additional Comments: O2 at home, 2L continuous    Prior Function Level of Independence: Independent;Independent with assistive device(s)         Comments: amb with RW; son assists with groceries/meals/errands; sister in law helps with cleaning--she will stay during day up to 5 hrs; also has meals on wheels     Hand Dominance        Extremity/Trunk Assessment   Upper Extremity Assessment Upper Extremity Assessment: Defer to OT evaluation    Lower Extremity Assessment Lower Extremity Assessment: LLE deficits/detail LLE Deficits / Details: ankle grossly WFL, knee and hip limited by anticipated post op pain/precautions       Communication   Communication: No difficulties  Cognition Arousal/Alertness: Awake/alert Behavior During Therapy: WFL for tasks assessed/performed Overall Cognitive Status: Within Functional  Limits for tasks assessed                                        General Comments      Exercises     Assessment/Plan    PT Assessment Patient needs continued PT services  PT Problem List Decreased strength;Decreased activity tolerance;Decreased mobility;Decreased knowledge of use of DME;Pain       PT Treatment Interventions DME instruction;Gait training;Functional mobility training;Therapeutic activities;Therapeutic  exercise;Patient/family education    PT Goals (Current goals can be found in the Care Plan section)  Acute Rehab PT Goals Patient Stated Goal: walk, get better and go home PT Goal Formulation: With patient/family Time For Goal Achievement: 01/25/17 Potential to Achieve Goals: Good    Frequency Min 3X/week   Barriers to discharge        Co-evaluation               End of Session Equipment Utilized During Treatment: Gait belt Activity Tolerance: Patient tolerated treatment well Patient left: in chair;with call bell/phone within reach;with chair alarm set   PT Visit Diagnosis: Muscle weakness (generalized) (M62.81);History of falling (Z91.81);Other abnormalities of gait and mobility (R26.89);Pain Pain - Right/Left: Left Pain - part of body: Hip    Time: 1051-1120 PT Time Calculation (min) (ACUTE ONLY): 29 min   Charges:   PT Evaluation $PT Eval Moderate Complexity: 1 Procedure PT Treatments $Therapeutic Activity: 8-22 mins   PT G CodesDrucilla Chalet, PT Pager: [phone] 01/18/2017   [facility] 01/18/2017, 12:45 PM

## 2017-01-18 NOTE — Anesthesia Postprocedure Evaluation (Addendum)
Anesthesia Post Note  Patient: Maureen Wilson  Procedure(s) Performed: Procedure(s) (LRB): LEFT TOTAL HIP REVISION (Left)  Patient location during evaluation: PACU Anesthesia Type: General Level of consciousness: awake Pain management: pain level controlled Vital Signs Assessment: post-procedure vital signs reviewed and stable Respiratory status: spontaneous breathing Cardiovascular status: stable Postop Assessment: no signs of nausea or vomiting Anesthetic complications: no        Last Vitals:  Vitals:   01/17/17 2317 01/18/17 0642  BP:  (!) 95/52  Pulse: 77 80  Resp: 15 20  Temp:  36.8 C    Last Pain:  Vitals:   01/18/17 0642  TempSrc: Oral  PainSc:    Pain Goal: Patients Stated Pain Goal: 5 (01/16/17 2025)               Mercer Peifer JR,JOHN Susann Givens

## 2017-01-18 NOTE — Progress Notes (Signed)
PROGRESS NOTE    Maureen Wilson  WUJ:[id number] DOB: 10-Jan-1941 DOA: 01/16/2017 PCP: Joanna Hews, MD    Brief Narrative:  76 y.o. female with medical history significant of PAF on coumadin, COPD on 2 L oxygen, CKD stage III, cr baseline 1.5, who presents complaining of left hip pain. Patient was walking with her walker when she felt a pop in her hip and fell down on her left hip. She denies chest pain, dyspnea, lightheaded prior to episode. She is having left hip pain, worse with movement. She is able to walk with her walker at home. Able to walk only 25 feet without dyspnea. She uses 2 L of oxygen.  She currently denies chest pain or dyspnea.    ED Course: WBC at 13, cr a.59, BUN 32, Co 2 33; k at 2.8. Left hip X ray: Left hip arthroplasty with anterior dislocation of the femoral head component relative to the acetabular cup. Chest x ray; cardiomegaly without failure.  Assessment & Plan:   Active Problems:   Fall   Closed dislocation of left hip (HCC)   A-fib (HCC)   Chronic diastolic CHF (congestive heart failure) (HCC)   COPD with chronic bronchitis (HCC)   Failed total hip arthroplasty with dislocation (HCC)  1-Left hip closed dislocation, hardware.  Patient high risk for procedure due to multiples comorbidity, CKD, HF, A fib, COPD on oxygen.  EKG A fib.  Chest x ray cardiomegaly.  2d echo with normal LVEF, grade 2 diastolic dysfunction Patient s/p surgery on 4/12. Orthopedic Surgery is following  2-hypokalemia;  Corrected Recheck bmet in AM.   3-HF, suspect diastolic. Unclear if systolic.  Will ordered ECHO pre -op. Reviewed per above Cr slightly higher today, clinically dehydrated this AM Will cont to hold lasix Cont beta blocker per home regimen  4-A fib; hold coumadin pre op.  Continue with Cardizem and Atenol.  Will need to resume coumadin when ok by Ortho.  Stable currently  5-Hypothyroidism; continue with synthroid as tolerated -Currently  stable  6-Leukocytosis; follow trend.  Suspect stress related.  -improved  7-COPD; continue with nebulizer, and Breo.  Continue CPAP at bedtime. -remains stable   8-anxiety; will continue klonopin as needed.   DVT prophylaxis: SCD's Code Status: DNR Family Communication: Pt in room, family not at bedside Disposition Plan: Uncertain at this time  Consultants:   Orthopedic surgery  Procedures:     Antimicrobials: Anti-infectives    Start     Dose/Rate Route Frequency Ordered Stop   01/18/17 0400  vancomycin (VANCOCIN) IVPB 1000 mg/200 mL premix     1,000 mg 200 mL/hr over 60 Minutes Intravenous Every 12 hours 01/17/17 2132 01/18/17 0545   01/17/17 0600  ceFAZolin (ANCEF) IVPB 2g/100 mL premix     2 g 200 mL/hr over 30 Minutes Intravenous On call to O.R. 01/16/17 1839 01/17/17 1729   01/17/17 0600  vancomycin (VANCOCIN) 1,500 mg in sodium chloride 0.9 % 500 mL IVPB     1,500 mg 250 mL/hr over 120 Minutes Intravenous On call to O.R. 01/16/17 1839 01/17/17 1755      Subjective: Without complaints at this time  Objective: Vitals:   01/17/17 2317 01/18/17 0642 01/18/17 0859 01/18/17 1200  BP:  (!) 95/52 (!) 99/40   Pulse: 77 80    Resp: 15 20    Temp:  98.3 F (36.8 C)    TempSrc:  Oral    SpO2: 96% 100%  95%  Weight:  Height:        Intake/Output Summary (Last 24 hours) at 01/18/17 1413 Last data filed at 01/18/17 1000  Gross per 24 hour  Intake          4594.16 ml  Output             1460 ml  Net          3134.16 ml   Filed Weights   01/16/17 1506  Weight: 103.4 kg (228 lb)    Examination:  General exam: Awake, laying in bed, in nad  Respiratory system: normal resp effort, no audible wheezing Cardiovascular system: regular rate, s1-s2 Gastrointestinal system: soft, nondistended, pos BS Central nervous system: cn2-12 grossly intact, strength intact Extremities: perfused, no clubbing Skin: normal skin turgor, no notable skin lesions  seen Psychiatry: mood normal// no visual hallucinations   Data Reviewed: I have personally reviewed following labs and imaging studies  CBC:  Recent Labs Lab 01/16/17 1312 01/17/17 0606 01/17/17 2240 01/18/17 0624  WBC 13.1* 8.7 14.8* 12.4*  NEUTROABS 10.4*  --   --   --   HGB 11.8* 11.2* 12.0 11.8*  HCT 36.1 34.6* 37.3 36.2  MCV 91.9 92.5 89.2 92.3  PLT 212 191 169 144*   Basic Metabolic Panel:  Recent Labs Lab 01/16/17 1312 01/17/17 0606 01/17/17 2240 01/18/17 0624  NA 135 134*  --  135  K 2.8* 3.0*  --  4.1  CL 90* 91*  --  97*  CO2 33* 33*  --  28  GLUCOSE 113* 86  --  114*  BUN 32* 30*  --  29*  CREATININE 1.59* 1.54* 1.70* 2.08*  CALCIUM 9.3 8.9  --  8.0*   GFR: Estimated Creatinine Clearance: 26.9 mL/min (A) (by C-G formula based on SCr of 2.08 mg/dL (H)). Liver Function Tests: No results for input(s): AST, ALT, ALKPHOS, BILITOT, PROT, ALBUMIN in the last 168 hours. No results for input(s): LIPASE, AMYLASE in the last 168 hours. No results for input(s): AMMONIA in the last 168 hours. Coagulation Profile:  Recent Labs Lab 01/16/17 1312 01/17/17 0843 01/18/17 0624  INR 1.71 1.61 1.70   Cardiac Enzymes: No results for input(s): CKTOTAL, CKMB, CKMBINDEX, TROPONINI in the last 168 hours. BNP (last 3 results) No results for input(s): PROBNP in the last 8760 hours. HbA1C: No results for input(s): HGBA1C in the last 72 hours. CBG: No results for input(s): GLUCAP in the last 168 hours. Lipid Profile: No results for input(s): CHOL, HDL, LDLCALC, TRIG, CHOLHDL, LDLDIRECT in the last 72 hours. Thyroid Function Tests: No results for input(s): TSH, T4TOTAL, FREET4, T3FREE, THYROIDAB in the last 72 hours. Anemia Panel: No results for input(s): VITAMINB12, FOLATE, FERRITIN, TIBC, IRON, RETICCTPCT in the last 72 hours. Sepsis Labs: No results for input(s): PROCALCITON, LATICACIDVEN in the last 168 hours.  Recent Results (from the past 240 hour(s))  MRSA PCR  Screening     Status: Abnormal   Collection Time: 01/16/17 10:58 PM  Result Value Ref Range Status   MRSA by PCR POSITIVE (A) NEGATIVE Final    Comment:        The GeneXpert MRSA Assay (FDA approved for NASAL specimens only), is one component of a comprehensive MRSA colonization surveillance program. It is not intended to diagnose MRSA infection nor to guide or monitor treatment for MRSA infections. RESULT CALLED TO, READ BACK BY AND VERIFIED WITH: CERIC,B RN 4.12.18  ZANDO,C   Surgical pcr screen     Status: Abnormal  Collection Time: 01/17/17  6:22 AM  Result Value Ref Range Status   MRSA, PCR POSITIVE (A) NEGATIVE Final    Comment: RESULT CALLED TO, READ BACK BY AND VERIFIED WITH: JOHNSON,A @ 1226 ON [id number] BY POTEAT,S    Staphylococcus aureus POSITIVE (A) NEGATIVE Final    Comment:        The Xpert SA Assay (FDA approved for NASAL specimens in patients over 32 years of age), is one component of a comprehensive surveillance program.  Test performance has been validated by [facility] for patients greater than or equal to 49 year old. It is not intended to diagnose infection nor to guide or monitor treatment.      Radiology Studies: Dg Pelvis Portable  Result Date: 01/17/2017 CLINICAL DATA:  76 y/o  F; left hip arthroplasty. EXAM: PORTABLE PELVIS 1-2 VIEWS COMPARISON:  09/30/2015 pelvis CT FINDINGS: Bilateral hip arthroplasty and left proximal femur cerclage wires. Productive changes of the greater trochanters bilaterally. No acute fracture or dislocation is identified. There is a surgical injuring projecting over the left proximal femur. Extensive vascular calcifications. Right hemipelvis mesh anchors noted. IMPRESSION: Bilateral hip arthroplasty. No acute fracture or apparent hardware related complication identified. Electronically Signed   By: Mitzi Hansen M.D.   On: 01/17/2017 20:39   Dg Chest Port 1 View  Result Date: 01/16/2017 CLINICAL DATA:   Preop for left hip surgery.  Emphysema. EXAM: PORTABLE CHEST 1 VIEW COMPARISON:  Two-view chest x-ray 01/18/2016 FINDINGS: The heart size is exaggerate by low lung volumes. Atherosclerotic calcifications are present at the aortic arch. There is no edema or effusion. No focal airspace disease is present. Degenerative changes are noted at the shoulders bilaterally. IMPRESSION: 1. Low lung volumes. 2. Cardiomegaly without failure. 3. Aortic atherosclerosis. Electronically Signed   By: Marin Roberts M.D.   On: 01/16/2017 17:37    Scheduled Meds: . aspirin EC  81 mg Oral Daily  . atenolol  25 mg Oral Daily  . atorvastatin  20 mg Oral QHS  . cholecalciferol  2,000 Units Oral Daily  . diltiazem  180 mg Oral Daily  . docusate sodium  100 mg Oral BID  . enoxaparin (LOVENOX) injection  30 mg Subcutaneous Q24H  . escitalopram  20 mg Oral Daily  . fluticasone furoate-vilanterol  1 puff Inhalation Daily  . levothyroxine  50 mcg Oral Once per day on Sun Tue Wed Fri Sat  . levothyroxine  75 mcg Oral Once per day on Mon Thu  . montelukast  10 mg Oral QHS  . multivitamin with minerals  1 tablet Oral Daily  . mupirocin ointment  1 application Nasal BID  . pantoprazole  40 mg Oral Daily  . predniSONE  5 mg Oral Q breakfast  . senna  1 tablet Oral BID  . sodium chloride flush  3 mL Intravenous Q12H  . tamsulosin  0.4 mg Oral Daily  . warfarin  2.5 mg Oral NOW  . warfarin  2.5 mg Oral ONCE-1800  . Warfarin - Pharmacist Dosing Inpatient   Does not apply q1800   Continuous Infusions: . sodium chloride 75 mL/hr at 01/18/17 1200     LOS: 2 days   Wash Nienhaus, Scheryl Marten, MD [facility] Pager [phone]  If 7PM-7AM, please contact night-coverage [email or web address] Password [email or web address] 01/18/2017, 2:13 PM

## 2017-01-18 NOTE — Progress Notes (Signed)
ANTICOAGULATION CONSULT NOTE  Pharmacy Consult for Coumadin Indication: atrial fibrillation  Allergies  Allergen Reactions  . Amlodipine Swelling and Other (See Comments)    Reaction:  Lip/mouth swelling   . Hydralazine Swelling and Other (See Comments)    Reaction:  Lip/mouth swelling   . Iohexol Hives and Other (See Comments)    Desc: Pt developed hives after receiving iv dye for ct scan.  suggest she be premedicated for future exams, Onset Date: [id number]   . Metoprolol Swelling and Other (See Comments)    Reaction:  Lip/mouth swelling   . Olmesartan Swelling and Other (See Comments)    Reaction:  Lip/mouth swelling   . Ramipril Swelling and Other (See Comments)    Reaction:  Lip/mouth swelling   . Sulfa Antibiotics Swelling and Other (See Comments)    Reaction:  Lip/mouth swelling   . Telmisartan Swelling and Other (See Comments)    Reaction:  Lip/mouth swelling   . Tiotropium Swelling and Other (See Comments)    Reaction:  Lip/mouth swelling     Patient Measurements: Height:  (160 cm) Weight: 228 lb (103.4 kg) IBW/kg (Calculated) : 52.4  Vital Signs: Temp: 98.3 F (36.8 C) (04/13 0642) Temp Source: Oral (04/13 0642) BP: 95/52 (04/13 0642) Pulse Rate: 80 (04/13 0642)  Labs:  Recent Labs  01/16/17 1312 01/17/17 0606 01/17/17 0843 01/17/17 2240 01/18/17 0624  HGB 11.8* 11.2*  --  12.0 11.8*  HCT 36.1 34.6*  --  37.3 36.2  PLT 212 191  --  169 144*  LABPROT 20.3*  --  19.4*  --  20.2*  INR 1.71  --  1.61  --  1.70  CREATININE 1.59* 1.54*  --  1.70*  --     Estimated Creatinine Clearance: 32.9 mL/min (A) (by C-G formula based on SCr of 1.7 mg/dL (H)).  Medications:  Coumadin  daily except  on Tues/Thur/Sat.  LD 4/10 at 1930.  Assessment: 76 yo F on chronic Coumadin for Afib.  INR 1.71 on admission.  She received Vit K 5 mg PO on 4/11 to reverse INR for surgery.  S/P left total hip revision 4/12.  Pharmacy consulted to resume Coumadin  4/12.  01/18/2017:  INR 1.70 today - coumadin 2.5 mg NOT given last night b/c pt not alert enough to take medication  CBC:2 units of blood given yesterday, Hg stable, pltc down to 144. No acute bleeding issues reported.    No major drug-drug interactions   Goal of Therapy:  INR 2-3 Monitor platelets by anticoagulation protocol: Yes   Plan:  Coumadin 2.5mg  po x1 today Daily INR Lovenox  sq q24h until INR >2 per MD Monitor for s/sx of bleeding  Herby Abraham, Pharm.D. [phone] 01/18/2017 7:09 AM

## 2017-01-18 NOTE — Progress Notes (Signed)
PT TX NOTE   01/18/17 1400  PT Visit Information  Last PT Received On 01/18/17  Assistance Needed +2  History of Present Illness s/p L THA revision; PMHx: COPD, CHF, obesity, afib, R TKA, L THA, chronic O2  Subjective Data  Patient Stated Goal walk, get better and go home  Precautions  Precautions Posterior Hip;Other (comment)  Precaution Comments NO ACTIVE ABDuction L Hip  Restrictions  Weight Bearing Restrictions No  LLE Weight Bearing WBAT  Pain Assessment  Pain Assessment No/denies pain  Cognition  Arousal/Alertness Awake/alert (grimaces with movement)  Behavior During Therapy WFL for tasks assessed/performed  Overall Cognitive Status Within Functional Limits for tasks assessed  Bed Mobility  Overal bed mobility Needs Assistance  Bed Mobility Sit to Supine  Sit to supine +2 for physical assistance;Mod assist  General bed mobility comments assist for RLE (no active abd), scooting, to position and lower trunk; cues for technique and THP  Transfers  Overall transfer level Needs assistance  Equipment used Rolling walker (2 wheeled)  Transfers Sit to/from [employer] Transfers  Sit to [employer] safety/equipment;+2 physical assistance  Stand pivot transfers Min assist;+2 safety/equipment;+2 physical assistance  General transfer comment cues for LLE position, hand placement; assist to rise and stabilize; step by step cues for safe technique for stand pivot  Total Joint Exercises  Ankle Circles/Pumps AROM;Both;5 reps  Quad Sets AROM;Both;5 reps  PT - End of Session  Equipment Utilized During Treatment Gait belt  Activity Tolerance Patient tolerated treatment well  Patient left in bed;with call bell/phone within reach;with bed alarm set  Nurse Communication Mobility status  PT - Assessment/Plan  PT Plan Current plan remains appropriate  PT Visit Diagnosis Muscle weakness (generalized) (M62.81);History of falling (Z91.81);Other abnormalities of gait and mobility  (R26.89);Pain  Pain - Right/Left Left  Pain - part of body Hip  PT Frequency (ACUTE ONLY) 7X/week  Follow Up Recommendations Home health PT;Supervision/Assistance - 24 hour (would recommend SNF, pt declines currently)  PT equipment None recommended by PT  AM-PAC PT "6 Clicks" Daily Activity Outcome Measure  Difficulty turning over in bed (including adjusting bedclothes, sheets and blankets)? 2  Difficulty moving from lying on back to sitting on the side of the bed?  2  Difficulty sitting down on and standing up from a chair with arms (e.g., wheelchair, bedside commode, etc,.)? 2  Help needed moving to and from a bed to chair (including a wheelchair)? 2  Help needed walking in hospital room? 2  Help needed climbing 3-5 steps with a railing?  2  6 Click Score 12  Mobility G Code  CL  PT Goal Progression  Progress towards PT goals Progressing toward goals  Acute Rehab PT Goals  PT Goal Formulation With patient/family  Time For Goal Achievement 01/25/17  Potential to Achieve Goals Good  PT Time Calculation  PT Start Time (ACUTE ONLY) 1347  PT Stop Time (ACUTE ONLY) 1406  PT Time Calculation (min) (ACUTE ONLY) 19 min  PT General Charges  $$ ACUTE PT VISIT 1 Procedure  PT Treatments  $Therapeutic Activity 8-22 mins

## 2017-01-18 NOTE — Progress Notes (Signed)
CSW attempted to complete assessment and respond to consult but pt was sleeping.  Dorothe Pea. Dea Bitting, Theresia Majors, LCAS Clinical Social Worker Ph: [phone]

## 2017-01-18 NOTE — Progress Notes (Signed)
   Subjective:  Patient reports pain as mild to moderate.  No c/o.  Objective:   VITALS:   Vitals:   01/17/17 2217 01/17/17 2239 01/17/17 2317 01/18/17 0642  BP: 99/66 101/68  (!) 95/52  Pulse:   77 80  Resp:   15 20  Temp:    98.3 F (36.8 C)  TempSrc:    Oral  SpO2: 95%  96% 100%  Weight:      Height:        NAD ABD soft Sensation intact distally Intact pulses distally Dorsiflexion/Plantar flexion intact Incision: dressing C/D/I Compartment soft HV ss   Lab Results  Component Value Date   WBC 12.4 (H) 01/18/2017   HGB 11.8 (L) 01/18/2017   HCT 36.2 01/18/2017   MCV 92.3 01/18/2017   PLT 144 (L) 01/18/2017   BMET    Component Value Date/Time   NA 135 01/18/2017 0624   NA 135 (L) 06/26/2014 0820   K 4.1 01/18/2017 0624   K 3.7 06/26/2014 0820   CL 97 (L) 01/18/2017 0624   CL 106 06/26/2014 0820   CO2 28 01/18/2017 0624   CO2 22 06/26/2014 0820   GLUCOSE 114 (H) 01/18/2017 0624   GLUCOSE 100 (H) 06/26/2014 0820   BUN 29 (H) 01/18/2017 0624   BUN 31 (H) 06/26/2014 0820   CREATININE 2.08 (H) 01/18/2017 0624   CREATININE 1.57 (H) 06/26/2014 0820   CALCIUM 8.0 (L) 01/18/2017 0624   CALCIUM 8.6 06/26/2014 0820   GFRNONAA 22 (L) 01/18/2017 0624   GFRNONAA 32 (L) 06/26/2014 0820   GFRAA 26 (L) 01/18/2017 0624   GFRAA 38 (L) 06/26/2014 0820     Assessment/Plan: 1 Day Post-Op   Active Problems:   Fall   Closed dislocation of left hip (HCC)   A-fib (HCC)   Chronic diastolic CHF (congestive heart failure) (HCC)   COPD with chronic bronchitis (HCC)   Failed total hip arthroplasty with dislocation (HCC)   WBAT with walker Posterior hip precautions No active abduction for 6 weeks DVT ppx: coumadin with lovenox bridge, d/c lovenox when INR > 1.8 PT/OT PO pain control Dispo: D/C home with HHPT vs SNF depending on progress with therapy   Erin Uecker, Cloyde Reams 01/18/2017, 7:50 AM   Samson Frederic, MD Cell [phone]

## 2017-01-19 ENCOUNTER — Inpatient Hospital Stay (HOSPITAL_COMMUNITY): Payer: Medicare Other

## 2017-01-19 DIAGNOSIS — Z5181 Encounter for therapeutic drug level monitoring: Secondary | ICD-10-CM

## 2017-01-19 DIAGNOSIS — Z7901 Long term (current) use of anticoagulants: Secondary | ICD-10-CM

## 2017-01-19 LAB — CBC
HCT: 31 % — ABNORMAL LOW (ref 36.0–46.0)
Hemoglobin: 10 g/dL — ABNORMAL LOW (ref 12.0–15.0)
MCH: 28.6 pg (ref 26.0–34.0)
MCHC: 32.3 g/dL (ref 30.0–36.0)
MCV: 88.6 fL (ref 78.0–100.0)
Platelets: 150 10*3/uL (ref 150–400)
RBC: 3.5 MIL/uL — ABNORMAL LOW (ref 3.87–5.11)
RDW: 15.9 % — ABNORMAL HIGH (ref 11.5–15.5)
WBC: 9.5 10*3/uL (ref 4.0–10.5)

## 2017-01-19 LAB — BASIC METABOLIC PANEL
ANION GAP: 7 (ref 5–15)
BUN: 31 mg/dL — AB (ref 6–20)
CALCIUM: 8.2 mg/dL — AB (ref 8.9–10.3)
CO2: 29 mmol/L (ref 22–32)
Chloride: 97 mmol/L — ABNORMAL LOW (ref 101–111)
Creatinine, Ser: 1.97 mg/dL — ABNORMAL HIGH (ref 0.44–1.00)
GFR calc Af Amer: 27 mL/min — ABNORMAL LOW (ref >60)
GFR, EST NON AFRICAN AMERICAN: 24 mL/min — AB (ref >60)
GLUCOSE: 101 mg/dL — AB (ref 65–99)
Potassium: 3.2 mmol/L — ABNORMAL LOW (ref 3.5–5.1)
SODIUM: 133 mmol/L — AB (ref 135–145)

## 2017-01-19 LAB — PROTIME-INR
INR: 2.42
PROTHROMBIN TIME: 26.7 s — AB (ref 11.4–15.2)

## 2017-01-19 MED ORDER — POTASSIUM CHLORIDE CRYS ER 20 MEQ PO TBCR
40.0000 meq | EXTENDED_RELEASE_TABLET | Freq: Two times a day (BID) | ORAL | Status: AC
  Administered 2017-01-19 (×2): 40 meq via ORAL
  Filled 2017-01-19 (×2): qty 2

## 2017-01-19 MED ORDER — FLUTICASONE PROPIONATE 50 MCG/ACT NA SUSP: 1.0000 | Freq: Every day | NASAL | Status: DC

## 2017-01-19 MED ORDER — WARFARIN 0.5 MG HALF TABLET
0.5000 mg | ORAL_TABLET | Freq: Once | ORAL | Status: AC
  Administered 2017-01-19: 0.5 mg via ORAL
  Filled 2017-01-19: qty 1

## 2017-01-19 MED ORDER — OXYCODONE HCL 5 MG PO TABS
5.0000 mg | ORAL_TABLET | ORAL | Status: DC | PRN
  Administered 2017-01-19 – 2017-01-21 (×3): 10 mg via ORAL
  Filled 2017-01-19 (×3): qty 2

## 2017-01-19 MED ORDER — ESOMEPRAZOLE MAGNESIUM 20 MG PO CPDR
20.0000 mg | DELAYED_RELEASE_CAPSULE | Freq: Two times a day (BID) | ORAL | Status: DC
  Administered 2017-01-19 – 2017-01-21 (×5): 20 mg via ORAL
  Filled 2017-01-19 (×7): qty 1

## 2017-01-19 MED ORDER — SODIUM CHLORIDE 0.9 % IV SOLN
INTRAVENOUS | Status: DC
  Administered 2017-01-19: 20:00:00 via INTRAVENOUS

## 2017-01-19 MED ORDER — SALINE SPRAY 0.65 % NA SOLN
1.0000 | NASAL | Status: DC | PRN
  Filled 2017-01-19: qty 44

## 2017-01-19 NOTE — Progress Notes (Signed)
OT Cancellation Note  Patient Details Name: Maureen Wilson MRN: [id number] DOB: 06/15/1941   Cancelled Treatment:    Reason Eval/Treat Not Completed: Other (comment) Note plan for SNF. Will defer OT eval to SNF.   Zannie Kehr Sajid Ruppert 01/19/2017, 4:39 PM

## 2017-01-19 NOTE — Progress Notes (Signed)
PROGRESS NOTE    Maureen Wilson  ZOX:[id number] DOB: 11/14/1940 DOA: 01/16/2017 PCP: Joanna Hews, MD    Brief Narrative:  76 y.o. female with medical history significant of PAF on coumadin, COPD on 2 L oxygen, CKD stage III, cr baseline 1.5, who presents complaining of left hip pain. Patient was walking with her walker when she felt a pop in her hip and fell down on her left hip. She denies chest pain, dyspnea, lightheaded prior to episode. She is having left hip pain, worse with movement. She is able to walk with her walker at home. Able to walk only 25 feet without dyspnea. She uses 2 L of oxygen.  She currently denies chest pain or dyspnea.    ED Course: WBC at 13, cr a.59, BUN 32, Co 2 33; k at 2.8. Left hip X ray: Left hip arthroplasty with anterior dislocation of the femoral head component relative to the acetabular cup. Chest x ray; cardiomegaly without failure.  Assessment & Plan:   Active Problems:   Fall   Closed dislocation of left hip (HCC)   A-fib (HCC)   Chronic diastolic CHF (congestive heart failure) (HCC)   COPD with chronic bronchitis (HCC)   Failed total hip arthroplasty with dislocation (HCC)  1-Left hip closed dislocation, hardware.  Patient high risk for procedure due to multiples comorbidity, CKD, HF, A fib, COPD on oxygen.  EKG A fib.  Chest x ray cardiomegaly.  2d echo with normal LVEF, grade 2 diastolic dysfunction Patient s/p surgery on 4/12. Orthopedic Surgery is following - Stable at present  2-hypokalemia;  Corrected Will recheck bmet in AM.   3-HF, suspect diastolic. Unclear if systolic.  Will ordered ECHO pre -op. Reviewed per above Cr slightly higher today, clinically dehydrated this AM Will cont to hold lasix Will cont beta blocker per home regimen  4-A fib; hold coumadin pre op.  Continue with Cardizem and Atenol.  Will need to resume coumadin when ok by Ortho.  Presently stable  5-Hypothyroidism; continue with synthroid  as tolerated -stable at present  6-Leukocytosis; follow trend.  Suspect stress related.  -resolved  7-COPD; continue with nebulizer, and Breo.  Continue CPAP at bedtime. -currenlty stable   8-anxiety; Plan to continue klonopin as needed.   DVT prophylaxis: SCD's Code Status: DNR Family Communication: Pt in room, family not at bedside Disposition Plan: Uncertain at this time  Consultants:   Orthopedic surgery  Procedures:     Antimicrobials: Anti-infectives    Start     Dose/Rate Route Frequency Ordered Stop   01/18/17 0400  vancomycin (VANCOCIN) IVPB 1000 mg/200 mL premix     1,000 mg 200 mL/hr over 60 Minutes Intravenous Every 12 hours 01/17/17 2132 01/18/17 0545   01/17/17 0600  ceFAZolin (ANCEF) IVPB 2g/100 mL premix     2 g 200 mL/hr over 30 Minutes Intravenous On call to O.R. 01/16/17 1839 01/17/17 1729   01/17/17 0600  vancomycin (VANCOCIN) 1,500 mg in sodium chloride 0.9 % 500 mL IVPB     1,500 mg 250 mL/hr over 120 Minutes Intravenous On call to O.R. 01/16/17 1839 01/17/17 1755      Subjective: No complaints at this time  Objective: Vitals:   01/18/17 2010 01/18/17 2032 01/19/17 0437 01/19/17 1650  BP:  (!) 119/55 (!) 114/48 133/80  Pulse:  100 80 85  Resp:  Temp:  (!) 100.6 F (38.1 C) 97.5 F (36.4 C) 98.1 F (36.7 C)  TempSrc:  Axillary  Oral Oral  SpO2: 93% 100% 100% 94%  Weight:      Height:        Intake/Output Summary (Last 24 hours) at 01/19/17 1810 Last data filed at 01/19/17 1652  Gross per 24 hour  Intake             1800 ml  Output              550 ml  Net             1250 ml   Filed Weights   01/16/17 1506  Weight: 103.4 kg (228 lb)    Examination:  General exam: sitting in chair, conversant, in nad Respiratory system: no wheezing, normal resp effort Cardiovascular system: regular rhythm, s1-s2 Gastrointestinal system: nontender, nondistended, pos BS Central nervous system: no seizures, no  tremors Extremities: no cyanosis, no joint deformities Skin: no rashes, no pallor Psychiatry: affect normal// no auditory hallucinations   Data Reviewed: I have personally reviewed following labs and imaging studies  CBC:  Recent Labs Lab 01/16/17 1312 01/17/17 0606 01/17/17 2240 01/18/17 0624 01/19/17 0521  WBC 13.1* 8.7 14.8* 12.4* 9.5  NEUTROABS 10.4*  --   --   --   --   HGB 11.8* 11.2* 12.0 11.8* 10.0*  HCT 36.1 34.6* 37.3 36.2 31.0*  MCV 91.9 92.5 89.2 92.3 88.6  PLT 212 191 169 144* 150   Basic Metabolic Panel:  Recent Labs Lab 01/16/17 1312 01/17/17 0606 01/17/17 2240 01/18/17 0624 01/19/17 0521  NA 135 134*  --  135 133*  K 2.8* 3.0*  --  4.1 3.2*  CL 90* 91*  --  97* 97*  CO2 33* 33*  --  28 29  GLUCOSE 113* 86  --  114* 101*  BUN 32* 30*  --  29* 31*  CREATININE 1.59* 1.54* 1.70* 2.08* 1.97*  CALCIUM 9.3 8.9  --  8.0* 8.2*   GFR: Estimated Creatinine Clearance: 28.4 mL/min (A) (by C-G formula based on SCr of 1.97 mg/dL (H)). Liver Function Tests: No results for input(s): AST, ALT, ALKPHOS, BILITOT, PROT, ALBUMIN in the last 168 hours. No results for input(s): LIPASE, AMYLASE in the last 168 hours. No results for input(s): AMMONIA in the last 168 hours. Coagulation Profile:  Recent Labs Lab 01/16/17 1312 01/17/17 0843 01/18/17 0624 01/19/17 0521  INR 1.71 1.61 1.70 2.42   Cardiac Enzymes: No results for input(s): CKTOTAL, CKMB, CKMBINDEX, TROPONINI in the last 168 hours. BNP (last 3 results) No results for input(s): PROBNP in the last 8760 hours. HbA1C: No results for input(s): HGBA1C in the last 72 hours. CBG: No results for input(s): GLUCAP in the last 168 hours. Lipid Profile: No results for input(s): CHOL, HDL, LDLCALC, TRIG, CHOLHDL, LDLDIRECT in the last 72 hours. Thyroid Function Tests: No results for input(s): TSH, T4TOTAL, FREET4, T3FREE, THYROIDAB in the last 72 hours. Anemia Panel: No results for input(s): VITAMINB12, FOLATE,  FERRITIN, TIBC, IRON, RETICCTPCT in the last 72 hours. Sepsis Labs: No results for input(s): PROCALCITON, LATICACIDVEN in the last 168 hours.  Recent Results (from the past 240 hour(s))  MRSA PCR Screening     Status: Abnormal   Collection Time: 01/16/17 10:58 PM  Result Value Ref Range Status   MRSA by PCR POSITIVE (A) NEGATIVE Final    Comment:        The GeneXpert MRSA Assay (FDA approved for NASAL specimens only), is one component of a comprehensive MRSA colonization surveillance program. It is not intended  to diagnose MRSA infection nor to guide or monitor treatment for MRSA infections. RESULT CALLED TO, READ BACK BY AND VERIFIED WITH: CERIC,B RN 4.12.18  ZANDO,C   Surgical pcr screen     Status: Abnormal   Collection Time: 01/17/17  6:22 AM  Result Value Ref Range Status   MRSA, PCR POSITIVE (A) NEGATIVE Final    Comment: RESULT CALLED TO, READ BACK BY AND VERIFIED WITH: JOHNSON,A @ 1226 ON [id number] BY POTEAT,S    Staphylococcus aureus POSITIVE (A) NEGATIVE Final    Comment:        The Xpert SA Assay (FDA approved for NASAL specimens in patients over 10 years of age), is one component of a comprehensive surveillance program.  Test performance has been validated by [facility] for patients greater than or equal to 94 year old. It is not intended to diagnose infection nor to guide or monitor treatment.      Radiology Studies: Dg Pelvis Portable  Result Date: 01/17/2017 CLINICAL DATA:  76 y/o  F; left hip arthroplasty. EXAM: PORTABLE PELVIS 1-2 VIEWS COMPARISON:  09/30/2015 pelvis CT FINDINGS: Bilateral hip arthroplasty and left proximal femur cerclage wires. Productive changes of the greater trochanters bilaterally. No acute fracture or dislocation is identified. There is a surgical injuring projecting over the left proximal femur. Extensive vascular calcifications. Right hemipelvis mesh anchors noted. IMPRESSION: Bilateral hip arthroplasty. No acute fracture or  apparent hardware related complication identified. Electronically Signed   By: Mitzi Hansen M.D.   On: 01/17/2017 20:39   Dg Chest Port 1 View  Result Date: 01/19/2017 CLINICAL DATA:  Shortness of breath, cough and congestion for 2 days. EXAM: PORTABLE CHEST 1 VIEW COMPARISON:  01/16/2017 and prior exams FINDINGS: The cardiomediastinal silhouette is unremarkable. There is no evidence of focal airspace disease, pulmonary edema, suspicious pulmonary nodule/mass, pleural effusion, or pneumothorax. No acute bony abnormalities are identified. IMPRESSION: No active disease. Electronically Signed   By: Harmon Pier M.D.   On: 01/19/2017 17:54    Scheduled Meds: . aspirin EC  81 mg Oral Daily  . atenolol  25 mg Oral Daily  . atorvastatin  20 mg Oral QHS  . cholecalciferol  2,000 Units Oral Daily  . diltiazem  180 mg Oral Daily  . docusate sodium  100 mg Oral BID  . escitalopram  20 mg Oral Daily  . esomeprazole  20 mg Oral BID AC  . fluticasone furoate-vilanterol  1 puff Inhalation Daily  . levothyroxine  50 mcg Oral Once per day on Sun Tue Wed Fri Sat  . levothyroxine  75 mcg Oral Once per day on Mon Thu  . montelukast  10 mg Oral QHS  . multivitamin with minerals  1 tablet Oral Daily  . mupirocin ointment  1 application Nasal BID  . potassium chloride  40 mEq Oral BID  . predniSONE  5 mg Oral Q breakfast  . senna  1 tablet Oral BID  . sodium chloride flush  3 mL Intravenous Q12H  . tamsulosin  0.4 mg Oral Daily  . Warfarin - Pharmacist Dosing Inpatient   Does not apply q1800   Continuous Infusions:    LOS: 3 days   Lindsay Soulliere, Scheryl Marten, MD [facility] Pager [phone]  If 7PM-7AM, please contact night-coverage [email or web address] Password [email or web address] 01/19/2017, 6:10 PM

## 2017-01-19 NOTE — Progress Notes (Signed)
Patient is refusing CPAP for tonight, states she has been doing fine wearing oxygen at night. No CPAP in room. No distress noted. Vitals stable.

## 2017-01-19 NOTE — Progress Notes (Signed)
   Subjective: 2 Days Post-Op Procedure(s) (LRB): LEFT TOTAL HIP REVISION (Left) Patient reports pain as moderate.   Patient seen in rounds for Dr. Linna Caprice.  She describes a lot of pain this morning. Will adjust pain medications. Patient is having problems with pain in the hip and thigh, requiring pain medications We will resume therapy today.   Objective: Vital signs in last 24 hours: Temp:  [97.5 F (36.4 C)-100.6 F (38.1 C)] 97.5 F (36.4 C) (04/14 0437) Pulse Rate:  [78-100] 80 (04/14 0437) Resp:  [20] 20 (04/14 0437) BP: (99-119)/(40-85) 114/48 (04/14 0437) SpO2:  [93 %-100 %] 100 % (04/14 0437)  Intake/Output from previous day:  Intake/Output Summary (Last 24 hours) at 01/19/17 0825 Last data filed at 01/19/17 0800  Gross per 24 hour  Intake          2428.33 ml  Output              700 ml  Net          1728.33 ml    Intake/Output this shift: Total I/O In: -  Out: 200 [Urine:200]  Labs:  Recent Labs  01/16/17 1312 01/17/17 0606 01/17/17 2240 01/18/17 0624 01/19/17 0521  HGB 11.8* 11.2* 12.0 11.8* 10.0*    Recent Labs  01/18/17 0624 01/19/17 0521  WBC 12.4* 9.5  RBC 3.92 3.50*  HCT 36.2 31.0*  PLT 144* 150    Recent Labs  01/18/17 0624 01/19/17 0521  NA 135 133*  K 4.1 3.2*  CL 97* 97*  CO2 28 29  BUN 29* 31*  CREATININE 2.08* 1.97*  GLUCOSE 114* 101*  CALCIUM 8.0* 8.2*    Recent Labs  01/18/17 0624 01/19/17 0521  INR 1.70 2.42    EXAM General - Patient is Alert, Appropriate and Oriented Extremity - Neurovascular intact Sensation intact distally Intact pulses distally Dorsiflexion/Plantar flexion intact Dressing - Incisional VAC in place over ther left hip incision Motor Function - intact, moving foot and toes well on exam.   Past Medical History:  Diagnosis Date  . Asthma   . Atrial fibrillation (HCC)   . Back pain   . Bronchitis    hx of  . CHF (congestive heart failure) (HCC)   . Chronic kidney disease   .  Chronic respiratory failure (HCC)   . COPD (chronic obstructive pulmonary disease) (HCC)   . Emphysema lung (HCC)   . Hypertension     Assessment/Plan: 2 Days Post-Op Procedure(s) (LRB): LEFT TOTAL HIP REVISION (Left) Active Problems:   Fall   Closed dislocation of left hip (HCC)   A-fib (HCC)   Chronic diastolic CHF (congestive heart failure) (HCC)   COPD with chronic bronchitis (HCC)   Failed total hip arthroplasty with dislocation (HCC)  Estimated body mass index is 40.39 kg/m as calculated from the following:   Height as of this encounter:  (1.6 m).   Weight as of this encounter: 103.4 kg (228 lb). Up with therapy  DVT Prophylaxis - Coumadin, INR is 2.42 today.  May discontinue the Lovenox. Weight Bearing As Tolerated left Leg Begin Therapy Hip Preacutions Incisional VAC in place HGB is 10.0 postop K+ 3.2 - supplement INR 2.42 - DC Lovenox bridge  Avel Peace, PA-C Orthopaedic Surgery 01/19/2017, 8:25 AM

## 2017-01-19 NOTE — Progress Notes (Signed)
   01/19/17 1400  PT Visit Information  Last PT Received On 01/19/17  Reason Eval/Treat Not Completed Other (comment) (pt just back to bed with nurisng staff, very fatigued)

## 2017-01-19 NOTE — Progress Notes (Signed)
Physical Therapy Treatment Patient Details Name: Maureen Wilson MRN: [id number] DOB: Nov 26, 1940 Today's Date: 01/19/2017    History of Present Illness s/p L THA revision; PMHx: COPD, CHF, obesity, afib, R TKA, L THA, chronic O2    PT Comments    Pt progressing, although somewhat slowly; discussed with pt and son and she is now agreeable to SNF--strongly recommend; pt feels very fatigued after amb ~8', HR from 65 up to 115, 2-3/4 DOE on 2L O2  Dr. Rhona Leavens  also aware of change in D/C plan  Follow Up Recommendations  SNF (pt now agreeable)     Equipment Recommendations  None recommended by PT    Recommendations for Other Services       Precautions / Restrictions Precautions Precautions: Posterior Hip;Other (comment) Precaution Comments: NO ACTIVE ABDuction L Hip Restrictions Weight Bearing Restrictions: No LLE Weight Bearing: Weight bearing as tolerated    Mobility  Bed Mobility Overal bed mobility: Needs Assistance Bed Mobility: Supine to Sit     Supine to sit: Mod assist     General bed mobility comments: assist for RLE (no active abd), scooting, to position and lower trunk; cues for technique, THP and to self assist  Transfers Overall transfer level: Needs assistance Equipment used: Rolling walker (2 wheeled) Transfers: Sit to/from Stand Sit to Stand: Min assist;Mod assist;+2 safety/equipment;+2 physical assistance         General transfer comment: cues for LLE position, hand placement; assist to rise and stabilize; bed ht elevated minimally, slightly more assist today to come to standing  Ambulation/Gait Ambulation/Gait assistance: Min assist;+2 physical assistance;+2 safety/equipment Ambulation Distance (Feet): 8 Feet Assistive device: Rolling walker (2 wheeled) Gait Pattern/deviations: Step-to pattern;Trunk flexed;Decreased step length - right;Decreased step length - left;Decreased stance time - left     General Gait Details: multi-modal cues for RW  position,  step sequence, posture and breathing   Stairs            Wheelchair Mobility    Modified Rankin (Stroke Patients Only)       Balance                                            Cognition Arousal/Alertness: Awake/alert Behavior During Therapy: WFL for tasks assessed/performed Overall Cognitive Status: Within Functional Limits for tasks assessed                                        Exercises Total Joint Exercises Ankle Circles/Pumps: AROM;Both;10 reps Quad Sets: AROM;Both;10 reps Heel Slides: AROM;AAROM;Both;10 reps    General Comments        Pertinent Vitals/Pain Pain Assessment: No/denies pain (was premedicated)    Home Living                      Prior Function            PT Goals (current goals can now be found in the care plan section) Acute Rehab PT Goals Patient Stated Goal: walk, get better and go home PT Goal Formulation: With patient/family Time For Goal Achievement: 01/25/17 Potential to Achieve Goals: Good Progress towards PT goals: Progressing toward goals    Frequency    7X/week      PT Plan Discharge plan needs to be updated  Co-evaluation             End of Session Equipment Utilized During Treatment: Gait belt;Oxygen Activity Tolerance: Patient tolerated treatment well;Patient limited by fatigue Patient left: in chair;with call bell/phone within reach;with family/visitor present;Other (comment) (MD)   PT Visit Diagnosis: Muscle weakness (generalized) (M62.81);History of falling (Z91.81);Other abnormalities of gait and mobility (R26.89);Pain Pain - Right/Left: Left Pain - part of body: Hip     Time: 1610-9604 PT Time Calculation (min) (ACUTE ONLY): 35 min  Charges:  $Gait Training: 8-22 mins $Therapeutic Exercise: 8-22 mins                    G CodesDrucilla Chalet, PT Pager: [phone] 01/19/2017    [facility] 01/19/2017, 12:49 PM

## 2017-01-19 NOTE — Progress Notes (Signed)
ANTICOAGULATION CONSULT NOTE  Pharmacy Consult for Coumadin Indication: atrial fibrillation  Allergies  Allergen Reactions  . Amlodipine Swelling and Other (See Comments)    Reaction:  Lip/mouth swelling   . Hydralazine Swelling and Other (See Comments)    Reaction:  Lip/mouth swelling   . Iohexol Hives and Other (See Comments)    Desc: Pt developed hives after receiving iv dye for ct scan.  suggest she be premedicated for future exams, Onset Date: [id number]   . Metoprolol Swelling and Other (See Comments)    Reaction:  Lip/mouth swelling   . Olmesartan Swelling and Other (See Comments)    Reaction:  Lip/mouth swelling   . Ramipril Swelling and Other (See Comments)    Reaction:  Lip/mouth swelling   . Sulfa Antibiotics Swelling and Other (See Comments)    Reaction:  Lip/mouth swelling   . Telmisartan Swelling and Other (See Comments)    Reaction:  Lip/mouth swelling   . Tiotropium Swelling and Other (See Comments)    Reaction:  Lip/mouth swelling     Patient Measurements: Height:  (160 cm) Weight: 228 lb (103.4 kg) IBW/kg (Calculated) : 52.4  Vital Signs: Temp: 97.5 F (36.4 C) (04/14 0437) Temp Source: Oral (04/14 0437) BP: 114/48 (04/14 0437) Pulse Rate: 80 (04/14 0437)  Labs:  Recent Labs  01/17/17 0843 01/17/17 2240 01/18/17 0624 01/19/17 0521  HGB  --  12.0 11.8* 10.0*  HCT  --  37.3 36.2 31.0*  PLT  --  169 144* 150  LABPROT 19.4*  --  20.2* 26.7*  INR 1.61  --  1.70 2.42  CREATININE  --  1.70* 2.08* 1.97*    Estimated Creatinine Clearance: 28.4 mL/min (A) (by C-G formula based on SCr of 1.97 mg/dL (H)).  Medications:  Coumadin  daily except  on Tues/Thur/Sat.  LD 4/10 at 1930.  Assessment: 76 yo F on chronic Coumadin for Afib.  INR 1.71 on admission.  She received Vit K 5 mg PO on 4/11 to reverse INR for surgery.  S/P left total hip revision 4/12.  Pharmacy consulted to resume Coumadin 4/12.  Significant events:  4/12: coumadin dose  not given b/c pt was not alert enough to take medication; received 2 units PRBC 4/14: lovenox d/ced d/t therapeutic INR  Today, 01/19/2017:  INR is therapeutic but increased noticeably from 1.70 to 2.42 after one dose of 2.5 mg given last night  hgb down slightly to 10, plt stable at 150  No bleeding documented  No major drug-drug interactions   Goal of Therapy:  INR 2-3 Monitor platelets by anticoagulation protocol: Yes   Plan:  - reduce Coumadin to 0.5 mg po x1 today - Daily INR - Lovenox  sq q24h d/ced on 4/14 d/t INR >2 - Monitor for s/sx of bleeding  Dorna Leitz, PharmD, BCPS 01/19/2017 9:07 AM

## 2017-01-20 LAB — PROTIME-INR
INR: 2.55
PROTHROMBIN TIME: 27.9 s — AB (ref 11.4–15.2)

## 2017-01-20 LAB — CBC
HEMATOCRIT: 34.4 % — AB (ref 36.0–46.0)
HEMOGLOBIN: 11.1 g/dL — AB (ref 12.0–15.0)
MCH: 29.8 pg (ref 26.0–34.0)
MCHC: 32.3 g/dL (ref 30.0–36.0)
MCV: 92.2 fL (ref 78.0–100.0)
PLATELETS: 154 10*3/uL (ref 150–400)
RBC: 3.73 MIL/uL — ABNORMAL LOW (ref 3.87–5.11)
RDW: 15.7 % — ABNORMAL HIGH (ref 11.5–15.5)
WBC: 9.3 10*3/uL (ref 4.0–10.5)

## 2017-01-20 LAB — BASIC METABOLIC PANEL
Anion gap: 9 (ref 5–15)
BUN: 21 mg/dL — AB (ref 6–20)
CALCIUM: 8.5 mg/dL — AB (ref 8.9–10.3)
CO2: 24 mmol/L (ref 22–32)
CREATININE: 1.27 mg/dL — AB (ref 0.44–1.00)
Chloride: 101 mmol/L (ref 101–111)
GFR calc non Af Amer: 40 mL/min — ABNORMAL LOW (ref >60)
GFR, EST AFRICAN AMERICAN: 47 mL/min — AB (ref >60)
Glucose, Bld: 83 mg/dL (ref 65–99)
Potassium: 4.2 mmol/L (ref 3.5–5.1)
SODIUM: 134 mmol/L — AB (ref 135–145)

## 2017-01-20 MED ORDER — FUROSEMIDE 40 MG PO TABS
40.0000 mg | ORAL_TABLET | Freq: Every day | ORAL | Status: DC
  Administered 2017-01-21: 40 mg via ORAL
  Filled 2017-01-20: qty 1

## 2017-01-20 MED ORDER — WARFARIN SODIUM 1 MG PO TABS
1.0000 mg | ORAL_TABLET | Freq: Once | ORAL | Status: AC
  Administered 2017-01-20: 1 mg via ORAL
  Filled 2017-01-20: qty 1

## 2017-01-20 MED ORDER — FUROSEMIDE 10 MG/ML IJ SOLN
40.0000 mg | Freq: Once | INTRAMUSCULAR | Status: AC
  Administered 2017-01-20: 40 mg via INTRAVENOUS
  Filled 2017-01-20: qty 4

## 2017-01-20 MED ORDER — POLYETHYLENE GLYCOL 3350 17 G PO PACK
17.0000 g | PACK | Freq: Every day | ORAL | Status: DC
Start: 2017-01-20 — End: 2017-01-21
  Administered 2017-01-20 – 2017-01-21 (×2): 17 g via ORAL
  Filled 2017-01-20 (×2): qty 1

## 2017-01-20 NOTE — Progress Notes (Signed)
   Subjective: 3 Days Post-Op Procedure(s) (LRB): LEFT TOTAL HIP REVISION (Left) Patient reports pain as mild and moderate.   Patient seen in rounds with Dr. Lequita Halt. Patient is well, but has had some minor complaints of pain in the hip, requiring pain medications  Objective: Vital signs in last 24 hours: Temp:  [97.9 F (36.6 C)-98.2 F (36.8 C)] 98.2 F (36.8 C) (04/15 0437) Pulse Rate:  [85-93] 93 (04/15 0437) Resp:  [20] 20 (04/15 0437) BP: (117-133)/(49-80) 117/51 (04/15 0437) SpO2:  [94 %-100 %] 98 % (04/15 0437)  Intake/Output from previous day:  Intake/Output Summary (Last 24 hours) at 01/20/17 0759 Last data filed at 01/20/17 0657  Gross per 24 hour  Intake          1374.16 ml  Output              700 ml  Net           674.16 ml    Intake/Output this shift: No intake/output data recorded.  Labs:  Recent Labs  01/17/17 2240 01/18/17 0624 01/19/17 0521 01/20/17 0504  HGB 12.0 11.8* 10.0* 11.1*    Recent Labs  01/19/17 0521 01/20/17 0504  WBC 9.5 9.3  RBC 3.50* 3.73*  HCT 31.0* 34.4*  PLT 150 154    Recent Labs  01/19/17 0521 01/20/17 0504  NA 133* 134*  K 3.2* 4.2  CL 97* 101  CO2 29 24  BUN 31* 21*  CREATININE 1.97* 1.27*  GLUCOSE 101* 83  CALCIUM 8.2* 8.5*    Recent Labs  01/19/17 0521 01/20/17 0504  INR 2.42 2.55    EXAM General - Patient is Alert and Appropriate Extremity - Neurovascular intact Sensation intact distally Intact pulses distally Dorsiflexion/Plantar flexion intact Dressing/Incision - Incisional VAC in place over ther left hip incision Motor Function - intact, moving foot and toes well on exam.   Past Medical History:  Diagnosis Date  . Asthma   . Atrial fibrillation (HCC)   . Back pain   . Bronchitis    hx of  . CHF (congestive heart failure) (HCC)   . Chronic kidney disease   . Chronic respiratory failure (HCC)   . COPD (chronic obstructive pulmonary disease) (HCC)   . Emphysema lung (HCC)   .  Hypertension     Assessment/Plan: 3 Days Post-Op Procedure(s) (LRB): LEFT TOTAL HIP REVISION (Left) Active Problems:   Fall   Closed dislocation of left hip (HCC)   A-fib (HCC)   Chronic diastolic CHF (congestive heart failure) (HCC)   COPD with chronic bronchitis (HCC)   Failed total hip arthroplasty with dislocation (HCC)  Estimated body mass index is 40.39 kg/m as calculated from the following:   Height as of this encounter:  (1.6 m).   Weight as of this encounter: 103.4 kg (228 lb). Up with therapy  DVT Prophylaxis - Coumadin Weight Bearing As Tolerated left Leg F/U Thursday to see Swinteck  Avel Peace, PA-C Orthopaedic Surgery 01/20/2017, 7:59 AM

## 2017-01-20 NOTE — Progress Notes (Signed)
ANTICOAGULATION CONSULT NOTE  Pharmacy Consult for Coumadin Indication: atrial fibrillation  Allergies  Allergen Reactions  . Amlodipine Swelling and Other (See Comments)    Reaction:  Lip/mouth swelling   . Hydralazine Swelling and Other (See Comments)    Reaction:  Lip/mouth swelling   . Iohexol Hives and Other (See Comments)    Desc: Pt developed hives after receiving iv dye for ct scan.  suggest she be premedicated for future exams, Onset Date: [id number]   . Metoprolol Swelling and Other (See Comments)    Reaction:  Lip/mouth swelling   . Olmesartan Swelling and Other (See Comments)    Reaction:  Lip/mouth swelling   . Ramipril Swelling and Other (See Comments)    Reaction:  Lip/mouth swelling   . Sulfa Antibiotics Swelling and Other (See Comments)    Reaction:  Lip/mouth swelling   . Telmisartan Swelling and Other (See Comments)    Reaction:  Lip/mouth swelling   . Tiotropium Swelling and Other (See Comments)    Reaction:  Lip/mouth swelling     Patient Measurements: Height:  (160 cm) Weight: 228 lb (103.4 kg) IBW/kg (Calculated) : 52.4  Vital Signs: Temp: 98.2 F (36.8 C) (04/15 0437) Temp Source: Oral (04/15 0437) BP: 117/51 (04/15 0437) Pulse Rate: 93 (04/15 0437)  Labs:  Recent Labs  01/18/17 0624 01/19/17 0521 01/20/17 0504  HGB 11.8* 10.0* 11.1*  HCT 36.2 31.0* 34.4*  PLT 144* 150 154  LABPROT 20.2* 26.7* 27.9*  INR 1.70 2.42 2.55  CREATININE 2.08* 1.97* 1.27*    Estimated Creatinine Clearance: 44 mL/min (A) (by C-G formula based on SCr of 1.27 mg/dL (H)).  Medications:  Coumadin  daily except  on Tues/Thur/Sat.  LD 4/10 at 1930.  Assessment: 76 yo F on chronic Coumadin for Afib.  INR 1.71 on admission.  She received Vit K 5 mg PO on 4/11 to reverse INR for surgery.  S/P left total hip revision 4/12.  Pharmacy consulted to resume Coumadin 4/12.  Significant events:  4/12: coumadin dose not given b/c pt was not alert enough to take  medication; received 2 units PRBC 4/14: lovenox d/ced d/t therapeutic INR  Today, 01/20/2017:  INR is therapeutic at 2.55  Cbc relatively stable  No bleeding documented  No major drug-drug interactions   Eating 25-100% of meals  Goal of Therapy:  INR 2-3 Monitor platelets by anticoagulation protocol: Yes   Plan:  - Coumadin 1 mg po x1 today - Daily INR - Monitor for s/sx of bleeding  Dorna Leitz, PharmD, BCPS 01/20/2017 9:34 AM

## 2017-01-20 NOTE — Progress Notes (Signed)
Pt refused cpap tonight, stating it is too uncomfortable.  Pt was advised that RT is available all night should she change her mind.

## 2017-01-20 NOTE — Progress Notes (Addendum)
Called ortho PA, Drew, to discuss prevena plus device. Charger found and applied to device, Device is now holding suction, but no output for this shift.  Earnest Conroy. Clelia Croft, RN

## 2017-01-20 NOTE — Progress Notes (Signed)
Physical Therapy Treatment Patient Details Name: Maureen Wilson MRN: [id number] DOB: 1941/04/21 Today's Date: 01/20/2017    History of Present Illness s/p L THA revision; PMHx: COPD, CHF, obesity, afib, R TKA, L THA, chronic O2    PT Comments    Pt making gradual progress with mobility during PT session. Able to ambulate 25 ft with rw, noted drainage coming from wound vac area (nursing notified), using 2L O2. Requiring chair follow for safety and mod assist for bed mobility and transfers. Based upon the patient's current mobility, recommending SNF for further rehabilitation.    Follow Up Recommendations  SNF     Equipment Recommendations  None recommended by PT    Recommendations for Other Services       Precautions / Restrictions Precautions Precautions: Posterior Hip;Other (comment) Precaution Comments: NO ACTIVE ABDuction L Hip Restrictions Weight Bearing Restrictions: Yes LLE Weight Bearing: Weight bearing as tolerated    Mobility  Bed Mobility Overal bed mobility: Needs Assistance Bed Mobility: Supine to Sit     Supine to sit: Mod assist;HOB elevated (at trunk, pt using rails to assist)     General bed mobility comments: reminder for no active hip abduction and precautions.   Transfers Overall transfer level: Needs assistance Equipment used: Rolling walker (2 wheeled) Transfers: Sit to/from Stand Sit to Stand: Mod assist;From elevated surface            Ambulation/Gait Ambulation/Gait assistance: Min assist;+2 safety/equipment (chair follow) Ambulation Distance (Feet): 25 Feet Assistive device: Rolling walker (2 wheeled) Gait Pattern/deviations: Step-to pattern;Trunk flexed;Decreased step length - right;Decreased step length - left;Decreased stance time - left Gait velocity: slow pattern       Stairs            Wheelchair Mobility    Modified Rankin (Stroke Patients Only)       Balance Overall balance assessment: Needs  assistance Sitting-balance support: No upper extremity supported Sitting balance-Leahy Scale: Good     Standing balance support: Bilateral upper extremity supported Standing balance-Leahy Scale: Poor Standing balance comment: requiring rw                            Cognition Arousal/Alertness: Awake/alert Behavior During Therapy: WFL for tasks assessed/performed Overall Cognitive Status: Within Functional Limits for tasks assessed                                        Exercises Total Joint Exercises Ankle Circles/Pumps: AROM;Both;10 reps Quad Sets: Strengthening;Left;10 reps    General Comments        Pertinent Vitals/Pain Pain Assessment: Faces Faces Pain Scale: Hurts little more Pain Location: Lt hip Pain Descriptors / Indicators: Sore Pain Intervention(s): Limited activity within patient's tolerance;Monitored during session;RN gave pain meds during session    Home Living                      Prior Function            PT Goals (current goals can now be found in the care plan section) Acute Rehab PT Goals Patient Stated Goal: walk PT Goal Formulation: With patient/family Time For Goal Achievement: 01/25/17 Potential to Achieve Goals: Good Progress towards PT goals: Progressing toward goals    Frequency    7X/week      PT Plan Current plan remains appropriate  Co-evaluation             End of Session Equipment Utilized During Treatment: Gait belt;Oxygen Activity Tolerance: Patient tolerated treatment well Patient left: in chair;with call bell/phone within reach;with chair alarm set Nurse Communication: Mobility status PT Visit Diagnosis: Muscle weakness (generalized) (M62.81);History of falling (Z91.81);Other abnormalities of gait and mobility (R26.89);Pain Pain - Right/Left: Left Pain - part of body: Hip     Time: 5784-6962 PT Time Calculation (min) (ACUTE ONLY): 26 min  Charges:  $Gait Training:  8-22 mins $Therapeutic Activity: 8-22 mins                    G Codes:       Christiane Ha, PT, CSCS Pager [phone] Office [phone]    Delton See 01/20/2017, 3:47 PM

## 2017-01-20 NOTE — Progress Notes (Signed)
PROGRESS NOTE    Maureen Wilson  RUE:[id number] DOB: June 05, 1941 DOA: 01/16/2017 PCP: Joanna Hews, MD    Brief Narrative:  76 y.o. female with medical history significant of PAF on coumadin, COPD on 2 L oxygen, CKD stage III, cr baseline 1.5, who presents complaining of left hip pain. Patient was walking with her walker when she felt a pop in her hip and fell down on her left hip. She denies chest pain, dyspnea, lightheaded prior to episode. She is having left hip pain, worse with movement. She is able to walk with her walker at home. Able to walk only 25 feet without dyspnea. She uses 2 L of oxygen.  She currently denies chest pain or dyspnea.    ED Course: WBC at 13, cr a.59, BUN 32, Co 2 33; k at 2.8. Left hip X ray: Left hip arthroplasty with anterior dislocation of the femoral head component relative to the acetabular cup. Chest x ray; cardiomegaly without failure.  Assessment & Plan:   Active Problems:   Fall   Closed dislocation of left hip (HCC)   A-fib (HCC)   Chronic diastolic CHF (congestive heart failure) (HCC)   COPD with chronic bronchitis (HCC)   Failed total hip arthroplasty with dislocation (HCC)  1-Left hip closed dislocation, hardware.  Patient high risk for procedure due to multiples comorbidity, CKD, HF, A fib, COPD on oxygen.  EKG A fib.  Chest x ray cardiomegaly.  2d echo with normal LVEF, grade 2 diastolic dysfunction Patient s/p surgery on 4/12. Orthopedic Surgery is following - Patient remains stable at present  2-hypokalemia;  Corrected, stable currently Will recheck bmet in AM.   3-HF, suspect diastolic..  2D ECHO pre -op. Reviewed per above Plan to cont beta blocker per home regimen Will resume chronic lasix  4-A fib.  Continue with Cardizem and Atenol.  Continued on coumadin for secondary stroke prevention  5-Hypothyroidism; continue with synthroid as tolerated -remains stable at present  6-Leukocytosis; follow trend.    Suspect stress related.  -resolved  7-COPD; continue with nebulizer, and Breo.  Continue CPAP at bedtime. - noted to have increased wheezing overnight - continue on prednisone - Continue PRN bronchodilator  8-anxiety; Plan to continue klonopin as needed.  - Stable  DVT prophylaxis: SCD's Code Status: DNR Family Communication: Pt in room, family not at bedside Disposition Plan: Uncertain at this time  Consultants:   Orthopedic surgery  Procedures:     Antimicrobials: Anti-infectives    Start     Dose/Rate Route Frequency Ordered Stop   01/18/17 0400  vancomycin (VANCOCIN) IVPB 1000 mg/200 mL premix     1,000 mg 200 mL/hr over 60 Minutes Intravenous Every 12 hours 01/17/17 2132 01/18/17 0545   01/17/17 0600  ceFAZolin (ANCEF) IVPB 2g/100 mL premix     2 g 200 mL/hr over 30 Minutes Intravenous On call to O.R. 01/16/17 1839 01/17/17 1729   01/17/17 0600  vancomycin (VANCOCIN) 1,500 mg in sodium chloride 0.9 % 500 mL IVPB     1,500 mg 250 mL/hr over 120 Minutes Intravenous On call to O.R. 01/16/17 1839 01/17/17 1755      Subjective: Reports increased wheezing earlier this AM  Objective: Vitals:   01/19/17 2015 01/19/17 2018 01/20/17 0437 01/20/17 1344  BP: (!) 117/49  (!) 117/51 113/64  Pulse: 89  93 70  Resp: Temp: 97.9 F (36.6 C)  98.2 F (36.8 C) 97.5 F (36.4 C)  TempSrc: Oral  Oral  Oral  SpO2: 100% 100% 98% 98%  Weight:      Height:        Intake/Output Summary (Last 24 hours) at 01/20/17 1501 Last data filed at 01/20/17 1432  Gross per 24 hour  Intake          1854.16 ml  Output              950 ml  Net           904.16 ml   Filed Weights   01/16/17 1506  Weight: 103.4 kg (228 lb)    Examination:  General exam: Awake, laying in bed, in nad Respiratory system: distant breath sounds, no audible wheezing at present Cardiovascular system: regular rate, s1, s2 Gastrointestinal system: obese, soft, nondistended Central nervous  system: cn2-12 grossly intact, strength intact Extremities: no joint deformities, perfused Skin: normal skin turgor, no notable skin lesions seen Psychiatry: mood normal// no visual hallucinations   Data Reviewed: I have personally reviewed following labs and imaging studies  CBC:  Recent Labs Lab 01/16/17 1312 01/17/17 0606 01/17/17 2240 01/18/17 0624 01/19/17 0521 01/20/17 0504  WBC 13.1* 8.7 14.8* 12.4* 9.5 9.3  NEUTROABS 10.4*  --   --   --   --   --   HGB 11.8* 11.2* 12.0 11.8* 10.0* 11.1*  HCT 36.1 34.6* 37.3 36.2 31.0* 34.4*  MCV 91.9 92.5 89.2 92.3 88.6 92.2  PLT 212 191 169 144* 150 154   Basic Metabolic Panel:  Recent Labs Lab 01/16/17 1312 01/17/17 0606 01/17/17 2240 01/18/17 0624 01/19/17 0521 01/20/17 0504  NA 135 134*  --  135 133* 134*  K 2.8* 3.0*  --  4.1 3.2* 4.2  CL 90* 91*  --  97* 97* 101  CO2 33* 33*  --  GLUCOSE 113* 86  --  114* 101* 83  BUN 32* 30*  --  29* 31* 21*  CREATININE 1.59* 1.54* 1.70* 2.08* 1.97* 1.27*  CALCIUM 9.3 8.9  --  8.0* 8.2* 8.5*   GFR: Estimated Creatinine Clearance: 44 mL/min (A) (by C-G formula based on SCr of 1.27 mg/dL (H)). Liver Function Tests: No results for input(s): AST, ALT, ALKPHOS, BILITOT, PROT, ALBUMIN in the last 168 hours. No results for input(s): LIPASE, AMYLASE in the last 168 hours. No results for input(s): AMMONIA in the last 168 hours. Coagulation Profile:  Recent Labs Lab 01/16/17 1312 01/17/17 0843 01/18/17 0624 01/19/17 0521 01/20/17 0504  INR 1.71 1.61 1.70 2.42 2.55   Cardiac Enzymes: No results for input(s): CKTOTAL, CKMB, CKMBINDEX, TROPONINI in the last 168 hours. BNP (last 3 results) No results for input(s): PROBNP in the last 8760 hours. HbA1C: No results for input(s): HGBA1C in the last 72 hours. CBG: No results for input(s): GLUCAP in the last 168 hours. Lipid Profile: No results for input(s): CHOL, HDL, LDLCALC, TRIG, CHOLHDL, LDLDIRECT in the last 72  hours. Thyroid Function Tests: No results for input(s): TSH, T4TOTAL, FREET4, T3FREE, THYROIDAB in the last 72 hours. Anemia Panel: No results for input(s): VITAMINB12, FOLATE, FERRITIN, TIBC, IRON, RETICCTPCT in the last 72 hours. Sepsis Labs: No results for input(s): PROCALCITON, LATICACIDVEN in the last 168 hours.  Recent Results (from the past 240 hour(s))  MRSA PCR Screening     Status: Abnormal   Collection Time: 01/16/17 10:58 PM  Result Value Ref Range Status   MRSA by PCR POSITIVE (A) NEGATIVE Final    Comment:  The GeneXpert MRSA Assay (FDA approved for NASAL specimens only), is one component of a comprehensive MRSA colonization surveillance program. It is not intended to diagnose MRSA infection nor to guide or monitor treatment for MRSA infections. RESULT CALLED TO, READ BACK BY AND VERIFIED WITH: CERIC,B RN 4.12.18  ZANDO,C   Surgical pcr screen     Status: Abnormal   Collection Time: 01/17/17  6:22 AM  Result Value Ref Range Status   MRSA, PCR POSITIVE (A) NEGATIVE Final    Comment: RESULT CALLED TO, READ BACK BY AND VERIFIED WITH: JOHNSON,A @ 1226 ON [id number] BY POTEAT,S    Staphylococcus aureus POSITIVE (A) NEGATIVE Final    Comment:        The Xpert SA Assay (FDA approved for NASAL specimens in patients over 60 years of age), is one component of a comprehensive surveillance program.  Test performance has been validated by [facility] for patients greater than or equal to 84 year old. It is not intended to diagnose infection nor to guide or monitor treatment.      Radiology Studies: Dg Chest Port 1 View  Result Date: 01/19/2017 CLINICAL DATA:  Shortness of breath, cough and congestion for 2 days. EXAM: PORTABLE CHEST 1 VIEW COMPARISON:  01/16/2017 and prior exams FINDINGS: The cardiomediastinal silhouette is unremarkable. There is no evidence of focal airspace disease, pulmonary edema, suspicious pulmonary nodule/mass, pleural effusion, or  pneumothorax. No acute bony abnormalities are identified. IMPRESSION: No active disease. Electronically Signed   By: Harmon Pier M.D.   On: 01/19/2017 17:54    Scheduled Meds: . aspirin EC  81 mg Oral Daily  . atenolol  25 mg Oral Daily  . atorvastatin  20 mg Oral QHS  . cholecalciferol  2,000 Units Oral Daily  . diltiazem  180 mg Oral Daily  . docusate sodium  100 mg Oral BID  . escitalopram  20 mg Oral Daily  . esomeprazole  20 mg Oral BID AC  . fluticasone furoate-vilanterol  1 puff Inhalation Daily  . [START ON 01/21/2017] furosemide  40 mg Oral Daily  . levothyroxine  50 mcg Oral Once per day on Sun Tue Wed Fri Sat  . levothyroxine  75 mcg Oral Once per day on Mon Thu  . montelukast  10 mg Oral QHS  . multivitamin with minerals  1 tablet Oral Daily  . mupirocin ointment  1 application Nasal BID  . polyethylene glycol  17 g Oral Daily  . predniSONE  5 mg Oral Q breakfast  . senna  1 tablet Oral BID  . sodium chloride flush  3 mL Intravenous Q12H  . tamsulosin  0.4 mg Oral Daily  . warfarin  1 mg Oral ONCE-1800  . Warfarin - Pharmacist Dosing Inpatient   Does not apply q1800   Continuous Infusions:    LOS: 4 days   Adilene Areola, Scheryl Marten, MD [facility] Pager [phone]  If 7PM-7AM, please contact night-coverage [email or web address] Password [email or web address] 01/20/2017, 3:01 PM

## 2017-01-21 ENCOUNTER — Encounter
Admission: RE | Admit: 2017-01-21 | Discharge: 2017-01-21 | Disposition: A | Discharge status: Home / Self Care | Payer: Medicare Other | Source: Ambulatory Visit | Attending: Internal Medicine | Admitting: Internal Medicine

## 2017-01-21 DIAGNOSIS — I5021 Acute systolic (congestive) heart failure: Secondary | ICD-10-CM

## 2017-01-21 LAB — BASIC METABOLIC PANEL
Anion gap: 8 (ref 5–15)
BUN: 21 mg/dL — AB (ref 6–20)
CHLORIDE: 99 mmol/L — AB (ref 101–111)
CO2: 27 mmol/L (ref 22–32)
CREATININE: 1.22 mg/dL — AB (ref 0.44–1.00)
Calcium: 8.6 mg/dL — ABNORMAL LOW (ref 8.9–10.3)
GFR calc non Af Amer: 42 mL/min — ABNORMAL LOW (ref >60)
GFR, EST AFRICAN AMERICAN: 49 mL/min — AB (ref >60)
Glucose, Bld: 84 mg/dL (ref 65–99)
POTASSIUM: 3.6 mmol/L (ref 3.5–5.1)
SODIUM: 134 mmol/L — AB (ref 135–145)

## 2017-01-21 LAB — PROTIME-INR
INR: 2.66
Prothrombin Time: 28.9 seconds — ABNORMAL HIGH (ref 11.4–15.2)

## 2017-01-21 MED ORDER — PREDNISONE 5 MG PO TABS: 5.0000 mg | ORAL_TABLET | Freq: Every day | ORAL | Status: DC

## 2017-01-21 MED ORDER — SENNA 8.6 MG PO TABS: 1.0000 | ORAL_TABLET | Freq: Two times a day (BID) | ORAL | 0 refills | Status: DC

## 2017-01-21 MED ORDER — DOCUSATE SODIUM 100 MG PO CAPS: 100.0000 mg | ORAL_CAPSULE | Freq: Two times a day (BID) | ORAL | 0 refills | Status: DC

## 2017-01-21 MED ORDER — WARFARIN 0.5 MG HALF TABLET
0.5000 mg | ORAL_TABLET | Freq: Once | ORAL | Status: DC
  Filled 2017-01-21: qty 1

## 2017-01-21 MED ORDER — PREDNISONE 20 MG PO TABS
40.0000 mg | ORAL_TABLET | Freq: Every day | ORAL | Status: DC
  Administered 2017-01-21: 40 mg via ORAL
  Filled 2017-01-21: qty 2

## 2017-01-21 MED ORDER — OXYCODONE HCL 5 MG PO TABS: 5.0000 mg | ORAL_TABLET | ORAL | 0 refills | Status: DC | PRN

## 2017-01-21 NOTE — Clinical Social Work Placement (Signed)
   CLINICAL SOCIAL WORK PLACEMENT  NOTE  Date:  01/21/2017  Patient Details  Name: Maureen Wilson MRN: [id number] Date of Birth: [birth year]  Clinical Social Work is seeking post-discharge placement for this patient at the Skilled  Nursing Facility level of care (*CSW will initial, date and re-position this form in  chart as items are completed):  Yes   Patient/family provided with [facility] Clinical Social Work Department's list of facilities offering this level of care within the geographic area requested by the patient (or if unable, by the patient's family).  Yes   Patient/family informed of their freedom to choose among providers that offer the needed level of care, that participate in Medicare, Medicaid or managed care program needed by the patient, have an available bed and are willing to accept the patient.  Yes   Patient/family informed of [facility]'s ownership interest in [facility] and [facility], as well as of the fact that they are under no obligation to receive care at these facilities.  PASRR submitted to EDS on       PASRR number received on       Existing PASRR number confirmed on 01/21/17     FL2 transmitted to all facilities in geographic area requested by pt/family on 01/21/17     FL2 transmitted to all facilities within larger geographic area on       Patient informed that his/her managed care company has contracts with or will negotiate with certain facilities, including the following:        Yes   Patient/family informed of bed offers received.  Patient chooses bed at [facility]     Physician recommends and patient chooses bed at      Patient to be transferred to [facility] on 01/21/17.  Patient to be transferred to facility by ptar     Patient family notified on 01/21/17 of transfer.  Name of family member notified:  Tim     PHYSICIAN Please sign DNR, Please sign FL2     Additional Comment:     _______________________________________________ Burna Sis, LCSW 01/21/2017, 1:54 PM

## 2017-01-21 NOTE — Progress Notes (Signed)
ANTICOAGULATION CONSULT NOTE  Pharmacy Consult for Coumadin Indication: atrial fibrillation  Allergies  Allergen Reactions  . Amlodipine Swelling and Other (See Comments)    Reaction:  Lip/mouth swelling   . Hydralazine Swelling and Other (See Comments)    Reaction:  Lip/mouth swelling   . Iohexol Hives and Other (See Comments)    Desc: Pt developed hives after receiving iv dye for ct scan.  suggest she be premedicated for future exams, Onset Date: [id number]   . Metoprolol Swelling and Other (See Comments)    Reaction:  Lip/mouth swelling   . Olmesartan Swelling and Other (See Comments)    Reaction:  Lip/mouth swelling   . Ramipril Swelling and Other (See Comments)    Reaction:  Lip/mouth swelling   . Sulfa Antibiotics Swelling and Other (See Comments)    Reaction:  Lip/mouth swelling   . Telmisartan Swelling and Other (See Comments)    Reaction:  Lip/mouth swelling   . Tiotropium Swelling and Other (See Comments)    Reaction:  Lip/mouth swelling     Patient Measurements: Height:  (160 cm) Weight: 228 lb (103.4 kg) IBW/kg (Calculated) : 52.4  Vital Signs: Temp: 98 F (36.7 C) (04/16 0637) Temp Source: Oral (04/16 0637) BP: 124/67 (04/16 6578) Pulse Rate: 99 (04/16 0637)  Labs:  Recent Labs  01/19/17 0521 01/20/17 0504 01/21/17 0455  HGB 10.0* 11.1*  --   HCT 31.0* 34.4*  --   PLT 150 154  --   LABPROT 26.7* 27.9* 28.9*  INR 2.42 2.55 2.66  CREATININE 1.97* 1.27* 1.22*    Estimated Creatinine Clearance: 45.8 mL/min (A) (by C-G formula based on SCr of 1.22 mg/dL (H)).  Medications:  Coumadin  daily except  on Tues/Thur/Sat.  LD 4/10 at 1930.  Assessment: 76 yo F on chronic Coumadin for Afib.  INR 1.71 on admission.  She received Vit K 5 mg PO on 4/11 to reverse INR for surgery.  S/P left total hip revision 4/12.  Pharmacy consulted to resume Coumadin 4/12.  Significant events:  4/12: coumadin dose not given b/c pt was not alert enough to take  medication; received 2 units PRBC 4/14: lovenox d/ced d/t therapeutic INR  Today, 01/21/2017:  INR is therapeutic at 2.66  CBC relatively stable as of 4/15  No bleeding documented  No major drug-drug interactions but remains on aspirin  daily  Eating 100% of meals  Goal of Therapy:  INR 2-3 Monitor platelets by anticoagulation protocol: Yes   Plan:  - Coumadin 0.5 mg po x1 today - Daily INR - Monitor for s/sx of bleeding  Loralee Pacas, PharmD, BCPS Pager: [phone] 01/21/2017 7:20 AM

## 2017-01-21 NOTE — Care Management Important Message (Signed)
Important Message  Patient Details IM Letter given to Rhonda/Case Manager to present to Patient Name: Maureen Wilson MRN: [id number] Date of Birth: [birth year]   Medicare Important Message Given:  Yes    Caren Macadam 01/21/2017, 12:16 PMImportant Message  Patient Details  Name: Maureen Wilson MRN: [id number] Date of Birth: 10-11-1940   Medicare Important Message Given:  Yes    Caren Macadam 01/21/2017, 12:14 PM

## 2017-01-21 NOTE — Progress Notes (Signed)
Physical Therapy Treatment Patient Details Name: Maureen Wilson MRN: [id number] DOB: 1941-05-12 Today's Date: 01/21/2017    History of Present Illness s/p L THA revision; PMHx: COPD, CHF, obesity, afib, R TKA, L THA, chronic 2L O2    PT Comments    Pt assisted with ambulating short distance in hallway.  Reviewed posterior hip precautions especially maintaining during mobility.  Pt to d/c to SNF later today.  Follow Up Recommendations  SNF     Equipment Recommendations  None recommended by PT    Recommendations for Other Services       Precautions / Restrictions Precautions Precautions: Posterior Hip;Other (comment) Precaution Comments: NO ACTIVE ABDuction L Hip Restrictions Weight Bearing Restrictions: Yes LLE Weight Bearing: Weight bearing as tolerated    Mobility  Bed Mobility Overal bed mobility: Needs Assistance Bed Mobility: Supine to Sit     Supine to sit: Min assist     General bed mobility comments: assist for L LE so no active hip abduction, pt utilizes rails to self assist  Transfers Overall transfer level: Needs assistance Equipment used: Rolling walker (2 wheeled) Transfers: Sit to/from Stand Sit to Stand: Min assist         General transfer comment: verbal cues for maintaining precautions and hand placement, assist to rise   Ambulation/Gait Ambulation/Gait assistance: Min guard Ambulation Distance (Feet): 35 Feet Assistive device: Rolling walker (2 wheeled) Gait Pattern/deviations: Step-through pattern;Wide base of support;Decreased stride length     General Gait Details: verbal cues for RW positioning, pt ambulated to tolerance, remained on 2L O2 [place], slow gait speed   Stairs            Wheelchair Mobility    Modified Rankin (Stroke Patients Only)       Balance                                            Cognition Arousal/Alertness: Awake/alert Behavior During Therapy: WFL for tasks  assessed/performed Overall Cognitive Status: Within Functional Limits for tasks assessed                                        Exercises      General Comments        Pertinent Vitals/Pain Pain Assessment: 0-10 Pain Score: 3  Pain Location: Left hip Pain Descriptors / Indicators: Sore Pain Intervention(s): Limited activity within patient's tolerance;Repositioned;Monitored during session    Home Living                      Prior Function            PT Goals (current goals can now be found in the care plan section) Progress towards PT goals: Progressing toward goals    Frequency    7X/week      PT Plan Current plan remains appropriate    Co-evaluation             End of Session Equipment Utilized During Treatment: Gait belt;Oxygen Activity Tolerance: Patient tolerated treatment well Patient left: in chair;with call bell/phone within reach (pt reports she will call for assist out of recliner) Nurse Communication: Mobility status (request to change lower saturated hip dressing, drained onto bed pad) PT Visit Diagnosis: Other abnormalities of gait and mobility (R26.89)  Time: 9147-8295 PT Time Calculation (min) (ACUTE ONLY): 24 min  Charges:  $Gait Training: 8-22 mins                    G Codes:       Maureen Wilson, PT, DPT 01/21/2017 Pager: [phone]    Maureen Wilson E 01/21/2017, 12:04 PM

## 2017-01-21 NOTE — Progress Notes (Signed)
Patient discharged to SNF via ambulance. report called to facility. Surgical incision clean/dry/intact. No pressure ulcer noted. Patient in no distress during transportation.

## 2017-01-21 NOTE — Discharge Summary (Signed)
Physician Discharge Summary  Maureen Wilson UJW:[id number] DOB: 1941-04-08 DOA: 01/16/2017  PCP: Joanna Hews, MD  Admit date: 01/16/2017 Discharge date: 01/21/2017  Admitted From: Home Disposition:  SNF  Recommendations for Outpatient Follow-up:  1. Follow up with PCP in 1-2 weeks 2. Recommend repeat renal panel in 1 week  [place] Controlled Registry reviewed. No prescriptions for opioid medications are listed. Will provide short course for post-op related pain  Discharge Condition:Stable CODE STATUS:DNR Diet recommendation: Heart healthy   Brief/Interim Summary: 76 y.o.femalewith medical history significant of PAF on coumadin, COPD on 2 L oxygen, CKD stage III, cr baseline 1.5, who presents complaining of left hip pain. Patient was walking with her walker when she felt a pop in her hip and fell down on her left hip. She denies chest pain, dyspnea, lightheaded prior to episode. She is having left hip pain, worse with movement. She is able to walk with her walker at home. Able to walk only 25 feet without dyspnea. She uses 2 L of oxygen.  She currently denies chest pain or dyspnea.    ED Course:WBC at 13, cr a.59, BUN 32, Co 2 33; k at 2.8. Left hip X ray: Left hip arthroplasty with anterior dislocation of the femoral head component relative to the acetabular cup. Chest x ray; cardiomegaly without failure.  1-Left hip closed dislocation, hardware.  Patient high risk for procedure due to multiples comorbidity, CKD, HF, A fib, COPD on oxygen.  EKG A fib.  Chest x ray cardiomegaly.  2d echo with normal LVEF, grade 2 diastolic dysfunction Patient s/p surgery on 4/12. Orthopedic Surgery is following - Patient has remained stable  2-hypokalemia; Corrected, stable currently  3-HF, suspect diastolic..  2D ECHO pre -op. Reviewed per above Plan to cont beta blocker per home regimen Will resume chronic lasix  4-A fib. Continue with Cardizem and Atenol.  Continued on coumadin  for secondary stroke prevention  5-Hypothyroidism; continued with synthroid as tolerated -Patient remains stable at present  6-Leukocytosis;follow trend.  Suspect stress related.  -resolved  7-COPD;continue with nebulizer, and Breo.  Continue CPAP at bedtime. - noted to have increased wheezing overnight - Continue PRN bronchodilator -Will give brief prednisone taper, followed by resuming daily 5mg  dosing  8-anxiety;Plan to continue klonopin as needed.  - Stable  Discharge Diagnoses:  Active Problems:   Fall   Closed dislocation of left hip (HCC)   A-fib (HCC)   Chronic diastolic CHF (congestive heart failure) (HCC)   COPD with chronic bronchitis (HCC)   Failed total hip arthroplasty with dislocation [facility])    Discharge Instructions   Allergies as of 01/21/2017      Reactions   Amlodipine Swelling, Other (See Comments)   Reaction:  Lip/mouth swelling    Hydralazine Swelling, Other (See Comments)   Reaction:  Lip/mouth swelling    Iohexol Hives, Other (See Comments)   Desc: Pt developed hives after receiving iv dye for ct scan.  suggest she be premedicated for future exams, Onset Date: [id number]   Metoprolol Swelling, Other (See Comments)   Reaction:  Lip/mouth swelling    Olmesartan Swelling, Other (See Comments)   Reaction:  Lip/mouth swelling    Ramipril Swelling, Other (See Comments)   Reaction:  Lip/mouth swelling    Sulfa Antibiotics Swelling, Other (See Comments)   Reaction:  Lip/mouth swelling    Telmisartan Swelling, Other (See Comments)   Reaction:  Lip/mouth swelling    Tiotropium Swelling, Other (See Comments)   Reaction:  Lip/mouth swelling  Medication List    TAKE these medications   albuterol (2.5 MG/3ML) 0.083% nebulizer solution Commonly known as:  PROVENTIL Inhale 3 mLs into the lungs every 4 (four) hours as needed for wheezing or shortness of breath.   albuterol-ipratropium 18-103 MCG/ACT inhaler Commonly known as:   COMBIVENT Inhale 1-2 puffs into the lungs every 4 (four) hours as needed for wheezing or shortness of breath.   aspirin EC 81 MG tablet Take 81 mg by mouth daily.   atenolol 25 MG tablet Commonly known as:  TENORMIN Take 25 mg by mouth daily.   atorvastatin 20 MG tablet Commonly known as:  LIPITOR Take 20 mg by mouth at bedtime.   BREO ELLIPTA 100-25 MCG/INH Aepb Generic drug:  fluticasone furoate-vilanterol Inhale 1 puff into the lungs daily.   cholecalciferol 1000 units tablet Commonly known as:  VITAMIN D Take 2,000 Units by mouth daily.   clonazePAM 1 MG tablet Commonly known as:  KLONOPIN Take 0.5-1 mg by mouth 2 (two) times daily. Pt takes one-half tablet in the morning, and one tablet at bedtime.   diltiazem 180 MG 24 hr capsule Commonly known as:  DILACOR XR Take 180 mg by mouth daily.   docusate sodium 100 MG capsule Commonly known as:  COLACE Take 1 capsule (100 mg total) by mouth 2 (two) times daily.   escitalopram 20 MG tablet Commonly known as:  LEXAPRO Take 20 mg by mouth daily.   esomeprazole 20 MG capsule Commonly known as:  NEXIUM Take 20 mg by mouth 2 (two) times daily before a meal.   fluticasone 50 MCG/ACT nasal spray Commonly known as:  FLONASE Place 2 sprays into both nostrils daily as needed for rhinitis.   furosemide 40 MG tablet Commonly known as:  LASIX Take 1 tablet (40 mg total) by mouth every other day. What changed:  when to take this  additional instructions   levothyroxine 50 MCG tablet Commonly known as:  SYNTHROID, LEVOTHROID Take 50 mcg by mouth daily before breakfast. Pt takes this dose on Sunday, Tuesday, Wednesday, Friday, and Saturday.   levothyroxine 75 MCG tablet Commonly known as:  SYNTHROID, LEVOTHROID Take 75 mcg by mouth daily before breakfast. Pt takes this dose on Monday and Thursday.   metolazone 2.5 MG tablet Commonly known as:  ZAROXOLYN Take 2.5 mg by mouth 2 (two) times a week. 2.5 mg every Monday &  Thursday   montelukast 10 MG tablet Commonly known as:  SINGULAIR Take 10 mg by mouth at bedtime.   multivitamin with minerals Tabs tablet Take 1 tablet by mouth daily.   oxyCODONE 5 MG immediate release tablet Commonly known as:  Oxy IR/ROXICODONE Take 1-2 tablets (5-10 mg total) by mouth every 4 (four) hours as needed for moderate pain or severe pain.   potassium chloride SA 20 MEQ tablet Commonly known as:  K-DUR,KLOR-CON Take 1 tablet (20 mEq total) by mouth every other day. What changed:  how much to take  when to take this  additional instructions   predniSONE 5 MG tablet Commonly known as:  DELTASONE Take 5 mg by mouth daily with breakfast. What changed:  Another medication with the same name was added. Make sure you understand how and when to take each.   predniSONE 5 MG tablet Commonly known as:  DELTASONE Take 1 tablet (5 mg total) by mouth daily with breakfast. What changed:  You were already taking a medication with the same name, and this prescription was added. Make sure you understand  how and when to take each.   senna 8.6 MG Tabs tablet Commonly known as:  SENOKOT Take 1 tablet (8.6 mg total) by mouth 2 (two) times daily.   tamsulosin 0.4 MG Caps capsule Commonly known as:  FLOMAX Take 0.4 mg by mouth daily.   warfarin 2 MG tablet Commonly known as:  COUMADIN Take 1-2 mg by mouth See admin instructions. 2 mg on mon,wed,fri,sun & 1 mg tues,thurs,sat      Follow-up Information    Swinteck, Cloyde Reams, MD. Schedule an appointment as soon as possible for a visit in 1 week.   Specialty:  Orthopedic Surgery Why:  For wound re-check, For suture removal Contact information: [address] [place] [address] [phone]          Allergies  Allergen Reactions  . Amlodipine Swelling and Other (See Comments)    Reaction:  Lip/mouth swelling   . Hydralazine Swelling and Other (See Comments)    Reaction:  Lip/mouth swelling   .  Iohexol Hives and Other (See Comments)    Desc: Pt developed hives after receiving iv dye for ct scan.  suggest she be premedicated for future exams, Onset Date: [id number]   . Metoprolol Swelling and Other (See Comments)    Reaction:  Lip/mouth swelling   . Olmesartan Swelling and Other (See Comments)    Reaction:  Lip/mouth swelling   . Ramipril Swelling and Other (See Comments)    Reaction:  Lip/mouth swelling   . Sulfa Antibiotics Swelling and Other (See Comments)    Reaction:  Lip/mouth swelling   . Telmisartan Swelling and Other (See Comments)    Reaction:  Lip/mouth swelling   . Tiotropium Swelling and Other (See Comments)    Reaction:  Lip/mouth swelling     Consultations:  Orthopedic Surgery  Procedures/Studies: Dg Pelvis Portable  Result Date: 01/17/2017 CLINICAL DATA:  76 y/o  F; left hip arthroplasty. EXAM: PORTABLE PELVIS 1-2 VIEWS COMPARISON:  09/30/2015 pelvis CT FINDINGS: Bilateral hip arthroplasty and left proximal femur cerclage wires. Productive changes of the greater trochanters bilaterally. No acute fracture or dislocation is identified. There is a surgical injuring projecting over the left proximal femur. Extensive vascular calcifications. Right hemipelvis mesh anchors noted. IMPRESSION: Bilateral hip arthroplasty. No acute fracture or apparent hardware related complication identified. Electronically Signed   By: Mitzi Hansen M.D.   On: 01/17/2017 20:39   Dg Chest Port 1 View  Result Date: 01/19/2017 CLINICAL DATA:  Shortness of breath, cough and congestion for 2 days. EXAM: PORTABLE CHEST 1 VIEW COMPARISON:  01/16/2017 and prior exams FINDINGS: The cardiomediastinal silhouette is unremarkable. There is no evidence of focal airspace disease, pulmonary edema, suspicious pulmonary nodule/mass, pleural effusion, or pneumothorax. No acute bony abnormalities are identified. IMPRESSION: No active disease. Electronically Signed   By: Harmon Pier M.D.   On:  01/19/2017 17:54   Dg Chest Port 1 View  Result Date: 01/16/2017 CLINICAL DATA:  Preop for left hip surgery.  Emphysema. EXAM: PORTABLE CHEST 1 VIEW COMPARISON:  Two-view chest x-ray 01/18/2016 FINDINGS: The heart size is exaggerate by low lung volumes. Atherosclerotic calcifications are present at the aortic arch. There is no edema or effusion. No focal airspace disease is present. Degenerative changes are noted at the shoulders bilaterally. IMPRESSION: 1. Low lung volumes. 2. Cardiomegaly without failure. 3. Aortic atherosclerosis. Electronically Signed   By: Marin Roberts M.D.   On: 01/16/2017 17:37   Dg Hip Unilat With Pelvis 2-3 Views Left  Result  Date: 01/16/2017 CLINICAL DATA:  Fall, left hip pain EXAM: DG HIP (WITH OR WITHOUT PELVIS) 2-3V LEFT COMPARISON:  None. FINDINGS: Right hip arthroplasty in satisfactory position. Left hip arthroplasty with anterior dislocation of the femoral head component relative to the acetabular cup. No fracture is seen. Degenerative changes of the lower lumbar spine. Vascular calcifications. Right inguinal hernia mesh repair. IMPRESSION: Left hip arthroplasty with anterior dislocation of the femoral head component relative to the acetabular cup. Electronically Signed   By: Charline Bills M.D.   On: 01/16/2017 13:05    Subjective: No complaints  Discharge Exam: Vitals:   01/21/17 0637 01/21/17 1029  BP: 124/67 (!) 121/53  Pulse: 99 90  Resp: 18   Temp: 98 F (36.7 C)    Vitals:   01/20/17 1344 01/20/17 1921 01/21/17 0637 01/21/17 1029  BP: 113/64  124/67 (!) 121/53  Pulse: 70  99 90  Resp: 18  18   Temp: 97.5 F (36.4 C)  98 F (36.7 C)   TempSrc: Oral  Oral   SpO2: 98% 98% 98%   Weight:      Height:        General: Pt is alert, awake, not in acute distress Cardiovascular: RRR, S1/S2 +, no rubs, no gallops Respiratory: CTA bilaterally, no wheezing, no rhonchi Abdominal: Soft, NT, ND, bowel sounds + Extremities: no edema, no  cyanosis   The results of significant diagnostics from this hospitalization (including imaging, microbiology, ancillary and laboratory) are listed below for reference.     Microbiology: Recent Results (from the past 240 hour(s))  MRSA PCR Screening     Status: Abnormal   Collection Time: 01/16/17 10:58 PM  Result Value Ref Range Status   MRSA by PCR POSITIVE (A) NEGATIVE Final    Comment:        The GeneXpert MRSA Assay (FDA approved for NASAL specimens only), is one component of a comprehensive MRSA colonization surveillance program. It is not intended to diagnose MRSA infection nor to guide or monitor treatment for MRSA infections. RESULT CALLED TO, READ BACK BY AND VERIFIED WITH: CERIC,B RN 4.12.18  ZANDO,C   Surgical pcr screen     Status: Abnormal   Collection Time: 01/17/17  6:22 AM  Result Value Ref Range Status   MRSA, PCR POSITIVE (A) NEGATIVE Final    Comment: RESULT CALLED TO, READ BACK BY AND VERIFIED WITH: JOHNSON,A @ 1226 ON [id number] BY POTEAT,S    Staphylococcus aureus POSITIVE (A) NEGATIVE Final    Comment:        The Xpert SA Assay (FDA approved for NASAL specimens in patients over 79 years of age), is one component of a comprehensive surveillance program.  Test performance has been validated by [facility] for patients greater than or equal to 60 year old. It is not intended to diagnose infection nor to guide or monitor treatment.      Labs: BNP (last 3 results) No results for input(s): BNP in the last 8760 hours. Basic Metabolic Panel:  Recent Labs Lab 01/17/17 0606 01/17/17 2240 01/18/17 0624 01/19/17 0521 01/20/17 0504 01/21/17 0455  NA 134*  --  135 133* 134* 134*  K 3.0*  --  4.1 3.2* 4.2 3.6  CL 91*  --  97* 97* 101 99*  CO2 33*  --  GLUCOSE 86  --  114* 101* 83 84  BUN 30*  --  29* 31* 21* 21*  CREATININE 1.54* 1.70* 2.08* 1.97* 1.27* 1.22*  CALCIUM 8.9  --  8.0* 8.2* 8.5* 8.6*   Liver Function Tests: No  results for input(s): AST, ALT, ALKPHOS, BILITOT, PROT, ALBUMIN in the last 168 hours. No results for input(s): LIPASE, AMYLASE in the last 168 hours. No results for input(s): AMMONIA in the last 168 hours. CBC:  Recent Labs Lab 01/16/17 1312 01/17/17 0606 01/17/17 2240 01/18/17 0624 01/19/17 0521 01/20/17 0504  WBC 13.1* 8.7 14.8* 12.4* 9.5 9.3  NEUTROABS 10.4*  --   --   --   --   --   HGB 11.8* 11.2* 12.0 11.8* 10.0* 11.1*  HCT 36.1 34.6* 37.3 36.2 31.0* 34.4*  MCV 91.9 92.5 89.2 92.3 88.6 92.2  PLT 212 191 169 144* 150 154   Cardiac Enzymes: No results for input(s): CKTOTAL, CKMB, CKMBINDEX, TROPONINI in the last 168 hours. BNP: Invalid input(s): POCBNP CBG: No results for input(s): GLUCAP in the last 168 hours. D-Dimer No results for input(s): DDIMER in the last 72 hours. Hgb A1c No results for input(s): HGBA1C in the last 72 hours. Lipid Profile No results for input(s): CHOL, HDL, LDLCALC, TRIG, CHOLHDL, LDLDIRECT in the last 72 hours. Thyroid function studies No results for input(s): TSH, T4TOTAL, T3FREE, THYROIDAB in the last 72 hours.  Invalid input(s): FREET3 Anemia work up No results for input(s): VITAMINB12, FOLATE, FERRITIN, TIBC, IRON, RETICCTPCT in the last 72 hours. Urinalysis    Component Value Date/Time   COLORURINE YELLOW (A) 01/18/2016 1427   APPEARANCEUR CLEAR (A) 01/18/2016 1427   APPEARANCEUR CLEAR 01/11/2015 0950   LABSPEC 1.005 01/18/2016 1427   LABSPEC 1.005 01/11/2015 0950   PHURINE 7.0 01/18/2016 1427   GLUCOSEU NEGATIVE 01/18/2016 1427   GLUCOSEU NEGATIVE 01/11/2015 0950   HGBUR NEGATIVE 01/18/2016 1427   BILIRUBINUR NEGATIVE 01/18/2016 1427   BILIRUBINUR NEGATIVE 01/11/2015 0950   KETONESUR NEGATIVE 01/18/2016 1427   PROTEINUR NEGATIVE 01/18/2016 1427   UROBILINOGEN 1.0 11/21/2010 1115   NITRITE NEGATIVE 01/18/2016 1427   LEUKOCYTESUR NEGATIVE 01/18/2016 1427   LEUKOCYTESUR NEGATIVE 01/11/2015 0950   Sepsis Labs Invalid  input(s): PROCALCITONIN,  WBC,  LACTICIDVEN Microbiology Recent Results (from the past 240 hour(s))  MRSA PCR Screening     Status: Abnormal   Collection Time: 01/16/17 10:58 PM  Result Value Ref Range Status   MRSA by PCR POSITIVE (A) NEGATIVE Final    Comment:        The GeneXpert MRSA Assay (FDA approved for NASAL specimens only), is one component of a comprehensive MRSA colonization surveillance program. It is not intended to diagnose MRSA infection nor to guide or monitor treatment for MRSA infections. RESULT CALLED TO, READ BACK BY AND VERIFIED WITH: CERIC,B RN 4.12.18  ZANDO,C   Surgical pcr screen     Status: Abnormal   Collection Time: 01/17/17  6:22 AM  Result Value Ref Range Status   MRSA, PCR POSITIVE (A) NEGATIVE Final    Comment: RESULT CALLED TO, READ BACK BY AND VERIFIED WITH: JOHNSON,A @ 1226 ON [id number] BY POTEAT,S    Staphylococcus aureus POSITIVE (A) NEGATIVE Final    Comment:        The Xpert SA Assay (FDA approved for NASAL specimens in patients over 38 years of age), is one component of a comprehensive surveillance program.  Test performance has been validated by [facility] for patients greater than or equal to 71 year old. It is not intended to diagnose infection nor to guide or monitor treatment.      SIGNED:   Jenaye Rickert  K, MD  [facility] 01/21/2017, 1:25 PM  If 7PM-7AM, please contact night-coverage [email or web address] Password [email or web address]

## 2017-01-21 NOTE — Clinical Social Work Note (Signed)
Clinical Social Work Assessment  Patient Details  Name: Maureen Wilson MRN: [id number] Date of Birth: 02-27-1941  Date of referral:  01/21/17               Reason for consult:  Facility Placement                Permission sought to share information with:  Oceanographer granted to share information::  Yes, Verbal Permission Granted  Name::     Radio producer::  SNF  Relationship::  son  Contact Information:     Housing/Transportation Living arrangements for the past 2 months:  Single Family Home Source of Information:  Patient, Adult Children Patient Interpreter Needed:  None Criminal Activity/Legal Involvement Pertinent to Current Situation/Hospitalization:  No - Comment as needed Significant Relationships:  Adult Children, Spouse Lives with:  Spouse Do you feel safe going back to the place where you live?  No Need for family participation in patient care:     Care giving concerns:  Pt lives at home with elderly spouse- given current weakness patient would not be safe to return home.   Social Worker assessment / plan:  CSW spoke with pt and pt son about PT recommendation for SNF- patient has been to SNF in the past and is familiar with SNF and SNF referral process.  Employment status:  Retired Health and safety inspector:  Medicare PT Recommendations:  Skilled Nursing Facility Information / Referral to community resources:  Skilled Nursing Facility  Patient/Family's Response to care: Pt agreeable to SNF stay- hopeful for [facility] [facility] which is close to home and where she has been in the past.  Patient/Family's Understanding of and Emotional Response to Diagnosis, Current Treatment, and Prognosis:  Pt family expresses good understanding of pt condition- hopeful SNF stay will be short and pt will be able to regain enough strength to return home safely.  Emotional Assessment Appearance:    Attitude/Demeanor/Rapport:    Affect (typically observed):   Appropriate, Accepting Orientation:  Oriented to Self, Oriented to Place Alcohol / Substance use:  Not Applicable Psych involvement (Current and /or in the community):  No (Comment)  Discharge Needs  Concerns to be addressed:  Care Coordination Readmission within the last 30 days:  No Current discharge risk:  Physical Impairment Barriers to Discharge:  Continued Medical Work up   Burna Sis, LCSW 01/21/2017, 10:33 AM

## 2017-01-21 NOTE — [place] FL2 (Signed)
[place] MEDICAID FL2 LEVEL OF CARE SCREENING TOOL     IDENTIFICATION  Patient Name: Maureen Wilson Birthdate: [birth year] Sex: female Admission Date (Current Location): 01/16/2017  [facility] and [place] Number:  Chiropodist and Address:  [facility],  [address] [place], [place] [address]      Provider Number: [id number]  Attending Physician Name and Address:  Jerald Kief, MD  Relative Name and Phone Number:       Current Level of Care: Hospital Recommended Level of Care: Skilled Nursing Facility Prior Approval Number:    Date Approved/Denied:   PASRR Number: [id number]  Discharge Plan: SNF    Current Diagnoses: Patient Active Problem List   Diagnosis Date Noted  . 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 Closed dislocation of left hip (HCC) 01/16/2017  . A-fib (HCC) 01/16/2017  . Chronic diastolic CHF (congestive heart failure) (HCC) 01/16/2017  . COPD with chronic bronchitis (HCC) 01/16/2017  . Grief 01/19/2016  . Depression, major, recurrent, moderate (HCC) 01/19/2016  . ARF (acute renal failure) (HCC) 01/18/2016  . Sepsis (HCC) 01/18/2016  . Aspiration pneumonia (HCC) 10/01/2015  . Fall 09/29/2015  . Gait instability 09/29/2015    Orientation RESPIRATION BLADDER Height & Weight     Self  O2, Other (Comment) (2L [place] during the day, CPAP at night) Continent Weight: 228 lb (103.4 kg) Height:   (160 cm)  BEHAVIORAL SYMPTOMS/MOOD NEUROLOGICAL BOWEL NUTRITION STATUS      Continent Diet (see DC summary)  AMBULATORY STATUS COMMUNICATION OF NEEDS Skin   Limited Assist Verbally Surgical wounds, Wound Vac (prevena wound vac)                       Personal Care Assistance Level of Assistance  Bathing, Dressing Bathing Assistance: Limited assistance   Dressing Assistance: Limited assistance     Functional Limitations Info             SPECIAL CARE FACTORS FREQUENCY  PT (By licensed PT), OT  (By licensed OT)     PT Frequency: 5/wk OT Frequency: 5/wk            Contractures      Additional Factors Info  Code Status, Allergies, Isolation Precautions Code Status Info: DNR Allergies Info: Amlodipine, Hydralazine, Iohexol, Metoprolol, Olmesartan, Ramipril, Sulfa Antibiotics, Telmisartan, Tiotropium     Isolation Precautions Info: MRSA     Current Medications (01/21/2017):  This is the current hospital active medication list Current Facility-Administered Medications  Medication Dose Route Frequency Provider Last Rate Last Dose  . acetaminophen (TYLENOL) tablet 650 mg  650 mg Oral Q6H PRN Samson Frederic, MD       Or  . acetaminophen (TYLENOL) suppository 650 mg  650 mg Rectal Q6H PRN Samson Frederic, MD      . albuterol (PROVENTIL) (2.5 MG/3ML) 0.083% nebulizer solution 3 mL  3 mL Inhalation Q4H PRN Belkys A Regalado, MD   3 mL at 01/20/17 1621  . aspirin EC tablet 81 mg  81 mg Oral Daily Belkys A Regalado, MD   81 mg at 01/20/17 1008  . atenolol (TENORMIN) tablet 25 mg  25 mg Oral Daily Belkys A Regalado, MD   25 mg at 01/20/17 1008  . atorvastatin (LIPITOR) tablet 20 mg  20 mg Oral QHS Belkys A Regalado, MD   20 mg at 01/20/17 2233  . cholecalciferol (VITAMIN D) tablet 2,000 Units  2,000 Units Oral Daily Belkys A  Regalado, MD   2,000 Units at 01/20/17 1008  . clonazePAM (KLONOPIN) tablet 0.5 mg  0.5 mg Oral Daily PRN Aleda Grana, RPH      . clonazePAM (KLONOPIN) tablet 1 mg  1 mg Oral QHS PRN Aleda Grana, RPH   1 mg at 01/16/17 2243  . diltiazem (CARDIZEM CD) 24 hr capsule 180 mg  180 mg Oral Daily Belkys A Regalado, MD   180 mg at 01/20/17 1008  . docusate sodium (COLACE) capsule 100 mg  100 mg Oral BID Belkys A Regalado, MD   100 mg at 01/20/17 2233  . escitalopram (LEXAPRO) tablet 20 mg  20 mg Oral Daily Belkys A Regalado, MD   20 mg at 01/20/17 1008  . esomeprazole (NEXIUM) capsule 20 mg  20 mg Oral BID AC Alexzandrew L Perkins, PA-C   20 mg at 01/20/17 1624   . fluticasone (FLONASE) 50 MCG/ACT nasal spray 2 spray  2 spray Each Nare Daily PRN Belkys A Regalado, MD      . fluticasone furoate-vilanterol (BREO ELLIPTA) 100-25 MCG/INH 1 puff  1 puff Inhalation Daily Belkys A Regalado, MD   1 puff at 01/20/17 1921  . furosemide (LASIX) tablet 40 mg  40 mg Oral Daily Jerald Kief, MD      . ipratropium-albuterol (DUONEB) 0.5-2.5 (3) MG/3ML nebulizer solution 3 mL  3 mL Nebulization Q4H PRN Aleda Grana, RPH   3 mL at 01/19/17 1656  . levothyroxine (SYNTHROID, LEVOTHROID) tablet 50 mcg  50 mcg Oral Once per day on Sun Tue Wed Fri Sat Alba Cory, MD   50 mcg at 01/20/17 1009  . levothyroxine (SYNTHROID, LEVOTHROID) tablet 75 mcg  75 mcg Oral Once per day on Mon Thu Alba Cory, MD   75 mcg at 01/17/17 0810  . menthol-cetylpyridinium (CEPACOL) lozenge 3 mg  1 lozenge Oral PRN Samson Frederic, MD   3 mg at 01/19/17 0650   Or  . phenol (CHLORASEPTIC) mouth spray 1 spray  1 spray Mouth/Throat PRN Samson Frederic, MD      . montelukast (SINGULAIR) tablet 10 mg  10 mg Oral QHS Belkys A Regalado, MD   10 mg at 01/20/17 2233  . multivitamin with minerals tablet 1 tablet  1 tablet Oral Daily Belkys A Regalado, MD   1 tablet at 01/20/17 1009  . mupirocin ointment (BACTROBAN) 2 % 1 application  1 application Nasal BID Alba Cory, MD   1 application at 01/20/17 2235  . ondansetron (ZOFRAN) tablet 4 mg  4 mg Oral Q6H PRN Belkys A Regalado, MD   4 mg at 01/16/17 2025   Or  . ondansetron (ZOFRAN) injection 4 mg  4 mg Intravenous Q6H PRN Belkys A Regalado, MD   4 mg at 01/17/17 1925  . oxyCODONE (Oxy IR/ROXICODONE) immediate release tablet 5-10 mg  5-10 mg Oral Q4H PRN Alexzandrew L Perkins, PA-C   10 mg at 01/20/17 1522  . polyethylene glycol (MIRALAX / GLYCOLAX) packet 17 g  17 g Oral Daily Jerald Kief, MD   17 g at 01/20/17 1254  . predniSONE (DELTASONE) tablet 5 mg  5 mg Oral Q breakfast Belkys A Regalado, MD   5 mg at 01/20/17 0803  . senna  (SENOKOT) tablet 8.6 mg  1 tablet Oral BID Belkys A Regalado, MD   8.6 mg at 01/20/17 2233  . sodium chloride (OCEAN) 0.65 % nasal spray 1 spray  1 spray Each Nare PRN Scheryl Marten  Rhona Leavens, MD      . sodium chloride 0.9 % bolus 500 mL  500 mL Intravenous Once PRN Jerald Kief, MD      . sodium chloride flush (NS) 0.9 % injection 3 mL  3 mL Intravenous Q12H Belkys A Regalado, MD   3 mL at 01/20/17 2234  . sodium chloride flush (NS) 0.9 % injection 3 mL  3 mL Intravenous PRN Belkys A Regalado, MD      . tamsulosin (FLOMAX) capsule 0.4 mg  0.4 mg Oral Daily Belkys A Regalado, MD   0.4 mg at 01/20/17 1009  . warfarin (COUMADIN) tablet 0.5 mg  0.5 mg Oral ONCE-1800 Rollene Fare, [facility]      . Warfarin - Pharmacist Dosing Inpatient   Does not apply q1800 Phylliss Blakes, [facility]         Discharge Medications: Please see discharge summary for a list of discharge medications.  Relevant Imaging Results:  Relevant Lab Results:   Additional Information SS#: [id number]  Burna Sis, LCSW

## 2017-01-21 NOTE — Progress Notes (Signed)
Patient will discharge to [facility] SNF Anticipated discharge date: 4/16 Family notified: [employer] by Sharin Mons- scheduled at 2:30pm Report #: [phone]  CSW signing off.  Burna Sis, LCSW Clinical Social Worker [phone]

## 2017-01-21 NOTE — [place] FL2 (Signed)
[place] MEDICAID FL2 LEVEL OF CARE SCREENING TOOL     IDENTIFICATION  Patient Name: Maureen Wilson Birthdate: Jun 05, 1941 Sex: female Admission Date (Current Location): 01/16/2017  [facility] and [place] Number:  Producer, television/film/video and Address:  [facility],  [address] [place], [place] [address]      Provider Number: [id number]  Attending Physician Name and Address:  Jerald Kief, MD  Relative Name and Phone Number:       Current Level of Care: Hospital Recommended Level of Care: Skilled Nursing Facility Prior Approval Number:    Date Approved/Denied:   PASRR Number: [id number]  Discharge Plan: SNF    Current Diagnoses: Patient Active Problem List   Diagnosis Date Noted  . 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 Closed dislocation of left hip (HCC) 01/16/2017  . A-fib (HCC) 01/16/2017  . Chronic diastolic CHF (congestive heart failure) (HCC) 01/16/2017  . COPD with chronic bronchitis (HCC) 01/16/2017  . Grief 01/19/2016  . Depression, major, recurrent, moderate (HCC) 01/19/2016  . ARF (acute renal failure) (HCC) 01/18/2016  . Sepsis (HCC) 01/18/2016  . Aspiration pneumonia (HCC) 10/01/2015  . Fall 09/29/2015  . Gait instability 09/29/2015    Orientation RESPIRATION BLADDER Height & Weight     Self  O2 (2L) Continent Weight: 228 lb (103.4 kg) Height:   (160 cm)  BEHAVIORAL SYMPTOMS/MOOD NEUROLOGICAL BOWEL NUTRITION STATUS      Continent Diet (heart healthy)  AMBULATORY STATUS COMMUNICATION OF NEEDS Skin   Limited Assist Verbally Other (Comment) (closed incision left hip, negative pressure wound therapy left hip)                       Personal Care Assistance Level of Assistance  Bathing, Feeding, Dressing Bathing Assistance: Limited assistance Feeding assistance: Independent Dressing Assistance: Limited assistance     Functional Limitations Info             SPECIAL CARE FACTORS FREQUENCY   PT (By licensed PT), OT (By licensed OT)     PT Frequency: 5 OT Frequency: 5            Contractures      Additional Factors Info  Code Status, Allergies Code Status Info: DNR Allergies Info: AMLODIPINE, HYDRALAZINE, IOHEXOL, METOPROLOL, OLMESARTAN, RAMIPRIL, SULFA ANTIBIOTICS, TELMISARTAN, TIOTROPIUM      Isolation Precautions Info: MRSA     Current Medications (01/21/2017):  This is the current hospital active medication list Current Facility-Administered Medications  Medication Dose Route Frequency Provider Last Rate Last Dose  . acetaminophen (TYLENOL) tablet 650 mg  650 mg Oral Q6H PRN Samson Frederic, MD       Or  . acetaminophen (TYLENOL) suppository 650 mg  650 mg Rectal Q6H PRN Samson Frederic, MD      . aspirin EC tablet 81 mg  81 mg Oral Daily Belkys A Regalado, MD   81 mg at 01/20/17 1008  . atenolol (TENORMIN) tablet 25 mg  25 mg Oral Daily Belkys A Regalado, MD   25 mg at 01/20/17 1008  . atorvastatin (LIPITOR) tablet 20 mg  20 mg Oral QHS Belkys A Regalado, MD   20 mg at 01/20/17 2233  . cholecalciferol (VITAMIN D) tablet 2,000 Units  2,000 Units Oral Daily Belkys A Regalado, MD   2,000 Units at 01/20/17 1008  . clonazePAM (KLONOPIN) tablet 0.5 mg  0.5 mg Oral Daily PRN Aleda Grana, RPH      .  clonazePAM (KLONOPIN) tablet 1 mg  1 mg Oral QHS PRN Aleda Grana, RPH   1 mg at 01/16/17 2243  . diltiazem (CARDIZEM CD) 24 hr capsule 180 mg  180 mg Oral Daily Belkys A Regalado, MD   180 mg at 01/20/17 1008  . docusate sodium (COLACE) capsule 100 mg  100 mg Oral BID Belkys A Regalado, MD   100 mg at 01/20/17 2233  . escitalopram (LEXAPRO) tablet 20 mg  20 mg Oral Daily Belkys A Regalado, MD   20 mg at 01/20/17 1008  . esomeprazole (NEXIUM) capsule 20 mg  20 mg Oral BID AC Alexzandrew L Perkins, PA-C   20 mg at 01/20/17 1624  . fluticasone (FLONASE) 50 MCG/ACT nasal spray 2 spray  2 spray Each Nare Daily PRN Belkys A Regalado, MD      . fluticasone  furoate-vilanterol (BREO ELLIPTA) 100-25 MCG/INH 1 puff  1 puff Inhalation Daily Belkys A Regalado, MD   1 puff at 01/20/17 1921  . furosemide (LASIX) tablet 40 mg  40 mg Oral Daily Jerald Kief, MD      . ipratropium-albuterol (DUONEB) 0.5-2.5 (3) MG/3ML nebulizer solution 3 mL  3 mL Nebulization Q4H PRN Aleda Grana, RPH   3 mL at 01/19/17 1656  . levothyroxine (SYNTHROID, LEVOTHROID) tablet 50 mcg  50 mcg Oral Once per day on Sun Tue Wed Fri Sat Alba Cory, MD   50 mcg at 01/20/17 1009  . levothyroxine (SYNTHROID, LEVOTHROID) tablet 75 mcg  75 mcg Oral Once per day on Mon Thu Alba Cory, MD   75 mcg at 01/17/17 0810  . menthol-cetylpyridinium (CEPACOL) lozenge 3 mg  1 lozenge Oral PRN Samson Frederic, MD   3 mg at 01/19/17 0650   Or  . phenol (CHLORASEPTIC) mouth spray 1 spray  1 spray Mouth/Throat PRN Samson Frederic, MD      . montelukast (SINGULAIR) tablet 10 mg  10 mg Oral QHS Belkys A Regalado, MD   10 mg at 01/20/17 2233  . multivitamin with minerals tablet 1 tablet  1 tablet Oral Daily Belkys A Regalado, MD   1 tablet at 01/20/17 1009  . mupirocin ointment (BACTROBAN) 2 % 1 application  1 application Nasal BID Alba Cory, MD   1 application at 01/20/17 2235  . ondansetron (ZOFRAN) tablet 4 mg  4 mg Oral Q6H PRN Belkys A Regalado, MD   4 mg at 01/16/17 2025   Or  . ondansetron (ZOFRAN) injection 4 mg  4 mg Intravenous Q6H PRN Belkys A Regalado, MD   4 mg at 01/17/17 1925  . oxyCODONE (Oxy IR/ROXICODONE) immediate release tablet 5-10 mg  5-10 mg Oral Q4H PRN Alexzandrew L Perkins, PA-C   10 mg at 01/20/17 1522  . polyethylene glycol (MIRALAX / GLYCOLAX) packet 17 g  17 g Oral Daily Jerald Kief, MD   17 g at 01/20/17 1254  . predniSONE (DELTASONE) tablet 40 mg  40 mg Oral Q breakfast Jerald Kief, MD      . senna [facility]) tablet 8.6 mg  1 tablet Oral BID Belkys A Regalado, MD   8.6 mg at 01/20/17 2233  . sodium chloride (OCEAN) 0.65 % nasal spray 1 spray  1  spray Each Nare PRN Jerald Kief, MD      . sodium chloride 0.9 % bolus 500 mL  500 mL Intravenous Once PRN Jerald Kief, MD      . sodium chloride flush (NS)  0.9 % injection 3 mL  3 mL Intravenous Q12H Belkys A Regalado, MD   3 mL at 01/20/17 2234  . sodium chloride flush (NS) 0.9 % injection 3 mL  3 mL Intravenous PRN Belkys A Regalado, MD      . tamsulosin (FLOMAX) capsule 0.4 mg  0.4 mg Oral Daily Belkys A Regalado, MD   0.4 mg at 01/20/17 1009  . warfarin (COUMADIN) tablet 0.5 mg  0.5 mg Oral ONCE-1800 Rollene Fare, [facility]      . Warfarin - Pharmacist Dosing Inpatient   Does not apply q1800 Phylliss Blakes, [facility]         Discharge Medications: Please see discharge summary for a list of discharge medications.  Relevant Imaging Results:  Relevant Lab Results:   Additional Information SS#:[id number]  Donnie Coffin, LCSW

## 2017-01-22 ENCOUNTER — Encounter (HOSPITAL_COMMUNITY): Payer: Self-pay | Admitting: Orthopedic Surgery

## 2017-01-24 ENCOUNTER — Other Ambulatory Visit
Admission: RE | Admit: 2017-01-24 | Discharge: 2017-01-24 | Disposition: A | Discharge status: Home / Self Care | Payer: No Typology Code available for payment source | Source: Ambulatory Visit | Attending: Internal Medicine | Admitting: Internal Medicine

## 2017-01-24 ENCOUNTER — Other Ambulatory Visit
Admission: RE | Admit: 2017-01-24 | Discharge: 2017-01-24 | Disposition: A | Discharge status: Home / Self Care | Payer: No Typology Code available for payment source | Source: Ambulatory Visit | Attending: Gerontology | Admitting: Gerontology

## 2017-01-24 DIAGNOSIS — D649 Anemia, unspecified: Secondary | ICD-10-CM | POA: Insufficient documentation

## 2017-01-24 DIAGNOSIS — I482 Chronic atrial fibrillation: Secondary | ICD-10-CM | POA: Insufficient documentation

## 2017-01-24 LAB — CBC WITH DIFFERENTIAL/PLATELET
BASOS PCT: 1 %
Basophils Absolute: 0.1 10*3/uL (ref 0–0.1)
EOS PCT: 1 %
Eosinophils Absolute: 0.1 10*3/uL (ref 0–0.7)
HEMATOCRIT: 31.9 % — AB (ref 35.0–47.0)
HEMOGLOBIN: 10.7 g/dL — AB (ref 12.0–16.0)
LYMPHS ABS: 1.4 10*3/uL (ref 1.0–3.6)
Lymphocytes Relative: 17 %
MCH: 30.3 pg (ref 26.0–34.0)
MCHC: 33.5 g/dL (ref 32.0–36.0)
MCV: 90.3 fL (ref 80.0–100.0)
Monocytes Absolute: 0.9 10*3/uL (ref 0.2–0.9)
Monocytes Relative: 11 %
NEUTROS ABS: 5.5 10*3/uL (ref 1.4–6.5)
Neutrophils Relative %: 70 %
PLATELETS: 224 10*3/uL (ref 150–440)
RBC: 3.54 MIL/uL — ABNORMAL LOW (ref 3.80–5.20)
RDW: 16.1 % — ABNORMAL HIGH (ref 11.5–14.5)
WBC: 8 10*3/uL (ref 3.6–11.0)

## 2017-01-24 LAB — PROTIME-INR
INR: 3.31
PROTHROMBIN TIME: 34.4 s — AB (ref 11.4–15.2)

## 2017-01-25 ENCOUNTER — Other Ambulatory Visit
Admission: RE | Admit: 2017-01-25 | Discharge: 2017-01-25 | Disposition: A | Discharge status: Home / Self Care | Payer: Medicare Other | Source: Skilled Nursing Facility | Attending: Gerontology | Admitting: Gerontology

## 2017-01-25 DIAGNOSIS — D649 Anemia, unspecified: Secondary | ICD-10-CM | POA: Diagnosis present

## 2017-01-25 LAB — PROTIME-INR
INR: 3.1
PROTHROMBIN TIME: 32.6 s — AB (ref 11.4–15.2)

## 2017-01-28 ENCOUNTER — Other Ambulatory Visit
Admission: RE | Admit: 2017-01-28 | Discharge: 2017-01-28 | Disposition: A | Discharge status: Home / Self Care | Payer: No Typology Code available for payment source | Source: Ambulatory Visit | Attending: Gerontology | Admitting: Gerontology

## 2017-01-28 DIAGNOSIS — I482 Chronic atrial fibrillation: Secondary | ICD-10-CM | POA: Insufficient documentation

## 2017-01-28 LAB — PROTIME-INR
INR: 2.24
PROTHROMBIN TIME: 25.2 s — AB (ref 11.4–15.2)

## 2017-01-30 ENCOUNTER — Other Ambulatory Visit
Admission: RE | Admit: 2017-01-30 | Discharge: 2017-01-30 | Disposition: A | Discharge status: Home / Self Care | Payer: No Typology Code available for payment source | Source: Ambulatory Visit | Attending: Gerontology | Admitting: Gerontology

## 2017-01-30 DIAGNOSIS — I482 Chronic atrial fibrillation: Secondary | ICD-10-CM | POA: Insufficient documentation

## 2017-01-30 LAB — PROTIME-INR
INR: 2.41
Prothrombin Time: 26.7 seconds — ABNORMAL HIGH (ref 11.4–15.2)

## 2017-01-31 ENCOUNTER — Non-Acute Institutional Stay (SKILLED_NURSING_FACILITY): Payer: Medicare Other | Admitting: Gerontology

## 2017-01-31 DIAGNOSIS — I5032 Chronic diastolic (congestive) heart failure: Secondary | ICD-10-CM | POA: Diagnosis not present

## 2017-01-31 DIAGNOSIS — Z96649 Presence of unspecified artificial hip joint: Secondary | ICD-10-CM | POA: Diagnosis not present

## 2017-01-31 DIAGNOSIS — T84028D Dislocation of other internal joint prosthesis, subsequent encounter: Secondary | ICD-10-CM | POA: Diagnosis not present

## 2017-02-01 ENCOUNTER — Other Ambulatory Visit
Admission: RE | Admit: 2017-02-01 | Discharge: 2017-02-01 | Disposition: A | Discharge status: Home / Self Care | Payer: No Typology Code available for payment source | Source: Skilled Nursing Facility | Attending: Gerontology | Admitting: Gerontology

## 2017-02-01 DIAGNOSIS — I482 Chronic atrial fibrillation: Secondary | ICD-10-CM | POA: Insufficient documentation

## 2017-02-01 LAB — PROTIME-INR
INR: 2.99
PROTHROMBIN TIME: 31.7 s — AB (ref 11.4–15.2)

## 2017-02-04 ENCOUNTER — Other Ambulatory Visit
Admission: RE | Admit: 2017-02-04 | Discharge: 2017-02-04 | Disposition: A | Discharge status: Home / Self Care | Payer: No Typology Code available for payment source | Source: Ambulatory Visit | Attending: Internal Medicine | Admitting: Internal Medicine

## 2017-02-04 DIAGNOSIS — I4891 Unspecified atrial fibrillation: Secondary | ICD-10-CM | POA: Insufficient documentation

## 2017-02-04 LAB — PROTIME-INR
INR: 5.36 — AB
PROTHROMBIN TIME: 50.6 s — AB (ref 11.4–15.2)

## 2017-02-04 NOTE — Progress Notes (Signed)
Location:      Place of Service:  SNF (31) Provider:  Lorenso Quarry, NP-C  Joanna Hews, MD  Patient Care Team: Joanna Hews, MD as PCP - General (Family Medicine)  Extended Emergency Contact Information Primary Emergency Contact: Drucie Opitz [place] of [place] Home Phone: [phone] Mobile Phone: [phone] Relation: Son Secondary Emergency Contact: Coralyn Helling [place] of [place] Home Phone: [phone] Mobile Phone: [phone] Relation: Spouse  Code Status:  dnr Goals of care: Advanced Directive information Advanced Directives 01/16/2017  Does Patient Have a Medical Advance Directive? No  Type of Advance Directive -  Does patient want to make changes to medical advance directive? -  Copy of Healthcare Power of Attorney in Chart? -  Would patient like information on creating a medical advance directive? No - Patient declined     Chief Complaint  Patient presents with  . Follow-up    HPI:  Pt is a 76 y.o. female seen today for an acute visit for increased edema in BLE. Pt has a known h/o CHF. Pt also has recently undergone a closed reduction of a dislocated Total Hip Arthroplasty. Pt reports her pain is well controlled, but she is concerned about the increased edema of BLE, She reports the edema in causing her legs to be painful. Calves soft, supple. Negative Homan's sign of both calves. Otherwise, pt reports she is feeling well. Pt denies n/v/d/f/c/cp/sob/ha/abd pain/dizziness. No cough or congestion. VSS. No other complaints.    Past Medical History:  Diagnosis Date  . Asthma   . Atrial fibrillation (HCC)   . Back pain   . Bronchitis    hx of  . CHF (congestive heart failure) (HCC)   . Chronic kidney disease   . Chronic respiratory failure (HCC)   . COPD (chronic obstructive pulmonary disease) (HCC)   . Emphysema lung (HCC)   . Hypertension    Past Surgical History:  Procedure Laterality Date  . ABDOMINAL HYSTERECTOMY    .  PARTIAL HIP ARTHROPLASTY Right   . TOTAL HIP ARTHROPLASTY Left   . TOTAL HIP REVISION Left 01/17/2017   Procedure: LEFT TOTAL HIP REVISION;  Surgeon: Samson Frederic, MD;  Location: [facility];  Service: Orthopedics;  Laterality: Left;    Allergies  Allergen Reactions  . Amlodipine Swelling and Other (See Comments)    Reaction:  Lip/mouth swelling   . Hydralazine Swelling and Other (See Comments)    Reaction:  Lip/mouth swelling   . Iohexol Hives and Other (See Comments)    Desc: Pt developed hives after receiving iv dye for ct scan.  suggest she be premedicated for future exams, Onset Date: [id number]   . Metoprolol Swelling and Other (See Comments)    Reaction:  Lip/mouth swelling   . Olmesartan Swelling and Other (See Comments)    Reaction:  Lip/mouth swelling   . Ramipril Swelling and Other (See Comments)    Reaction:  Lip/mouth swelling   . Sulfa Antibiotics Swelling and Other (See Comments)    Reaction:  Lip/mouth swelling   . Telmisartan Swelling and Other (See Comments)    Reaction:  Lip/mouth swelling   . Tiotropium Swelling and Other (See Comments)    Reaction:  Lip/mouth swelling     Allergies as of 01/31/2017      Reactions   Amlodipine Swelling, Other (See Comments)   Reaction:  Lip/mouth swelling    Hydralazine Swelling, Other (See Comments)   Reaction:  Lip/mouth swelling    Iohexol Hives, Other (See Comments)  Desc: Pt developed hives after receiving iv dye for ct scan.  suggest she be premedicated for future exams, Onset Date: [id number]   Metoprolol Swelling, Other (See Comments)   Reaction:  Lip/mouth swelling    Olmesartan Swelling, Other (See Comments)   Reaction:  Lip/mouth swelling    Ramipril Swelling, Other (See Comments)   Reaction:  Lip/mouth swelling    Sulfa Antibiotics Swelling, Other (See Comments)   Reaction:  Lip/mouth swelling    Telmisartan Swelling, Other (See Comments)   Reaction:  Lip/mouth swelling    Tiotropium Swelling, Other (See  Comments)   Reaction:  Lip/mouth swelling       Medication List       Accurate as of 01/31/17 11:59 PM. Always use your most recent med list.          albuterol (2.5 MG/3ML) 0.083% nebulizer solution Commonly known as:  PROVENTIL Inhale 3 mLs into the lungs every 4 (four) hours as needed for wheezing or shortness of breath.   albuterol-ipratropium 18-103 MCG/ACT inhaler Commonly known as:  COMBIVENT Inhale 1-2 puffs into the lungs every 4 (four) hours as needed for wheezing or shortness of breath.   aspirin EC 81 MG tablet Take 81 mg by mouth daily.   atenolol 25 MG tablet Commonly known as:  TENORMIN Take 25 mg by mouth daily.   atorvastatin 20 MG tablet Commonly known as:  LIPITOR Take 20 mg by mouth at bedtime.   BREO ELLIPTA 100-25 MCG/INH Aepb Generic drug:  fluticasone furoate-vilanterol Inhale 1 puff into the lungs daily.   cholecalciferol 1000 units tablet Commonly known as:  VITAMIN D Take 2,000 Units by mouth daily.   clonazePAM 1 MG tablet Commonly known as:  KLONOPIN Take 0.5-1 mg by mouth 2 (two) times daily. Pt takes one-half tablet in the morning, and one tablet at bedtime.   diltiazem 180 MG 24 hr capsule Commonly known as:  DILACOR XR Take 180 mg by mouth daily.   docusate sodium 100 MG capsule Commonly known as:  COLACE Take 1 capsule (100 mg total) by mouth 2 (two) times daily.   escitalopram 20 MG tablet Commonly known as:  LEXAPRO Take 20 mg by mouth daily.   esomeprazole 20 MG capsule Commonly known as:  NEXIUM Take 20 mg by mouth 2 (two) times daily before a meal.   fluticasone 50 MCG/ACT nasal spray Commonly known as:  FLONASE Place 2 sprays into both nostrils daily as needed for rhinitis.   furosemide 40 MG tablet Commonly known as:  LASIX Take 1 tablet (40 mg total) by mouth every other day.   levothyroxine 50 MCG tablet Commonly known as:  SYNTHROID, LEVOTHROID Take 50 mcg by mouth daily before breakfast. Pt takes this  dose on Sunday, Tuesday, Wednesday, Friday, and Saturday.   levothyroxine 75 MCG tablet Commonly known as:  SYNTHROID, LEVOTHROID Take 75 mcg by mouth daily before breakfast. Pt takes this dose on Monday and Thursday.   metolazone 2.5 MG tablet Commonly known as:  ZAROXOLYN Take 2.5 mg by mouth 2 (two) times a week. 2.5 mg every Monday & Thursday   montelukast 10 MG tablet Commonly known as:  SINGULAIR Take 10 mg by mouth at bedtime.   multivitamin with minerals Tabs tablet Take 1 tablet by mouth daily.   oxyCODONE 5 MG immediate release tablet Commonly known as:  Oxy IR/ROXICODONE Take 1-2 tablets (5-10 mg total) by mouth every 4 (four) hours as needed for moderate pain or severe pain.  potassium chloride SA 20 MEQ tablet Commonly known as:  K-DUR,KLOR-CON Take 1 tablet (20 mEq total) by mouth every other day.   predniSONE 5 MG tablet Commonly known as:  DELTASONE Take 5 mg by mouth daily with breakfast.   predniSONE 5 MG tablet Commonly known as:  DELTASONE Take 1 tablet (5 mg total) by mouth daily with breakfast.   senna 8.6 MG Tabs tablet Commonly known as:  SENOKOT Take 1 tablet (8.6 mg total) by mouth 2 (two) times daily.   tamsulosin 0.4 MG Caps capsule Commonly known as:  FLOMAX Take 0.4 mg by mouth daily.   warfarin 2 MG tablet Commonly known as:  COUMADIN Take 1-2 mg by mouth See admin instructions. 2 mg on mon,wed,fri,sun & 1 mg tues,thurs,sat       Review of Systems  Constitutional: Negative for activity change, appetite change, chills, diaphoresis and fever.  HENT: Negative for congestion, sneezing, sore throat, trouble swallowing and voice change.   Respiratory: Negative for apnea, cough, choking, chest tightness, shortness of breath and wheezing.   Cardiovascular: Positive for leg swelling. Negative for chest pain and palpitations.  Gastrointestinal: Negative for abdominal distention, abdominal pain, constipation, diarrhea and nausea.    Genitourinary: Negative for difficulty urinating, dysuria, frequency and urgency.  Musculoskeletal: Positive for arthralgias (typical arthritis). Negative for back pain, gait problem and myalgias.  Skin: Negative for color change, pallor, rash and wound.  Neurological: Negative for dizziness, tremors, syncope, speech difficulty, weakness, numbness and headaches.  Psychiatric/Behavioral: Negative for agitation and behavioral problems.  All other systems reviewed and are negative.    There is no immunization history on file for this patient. Pertinent  Health Maintenance Due  Topic Date Due  . PNA vac Low Risk Adult (1 of 2 - PCV13) 02/04/2006  . INFLUENZA VACCINE  05/08/2017  . DEXA SCAN  Completed   No flowsheet data found. Functional Status Survey:    Vitals:   01/30/17 1830  BP: 117/71  Pulse: 87  Resp: 16  Temp: 97.5 F (36.4 C)  SpO2: 94%  Weight: 240 lb 9.6 oz (109.1 kg)   Body mass index is 42.62 kg/m. Physical Exam  Constitutional: She is oriented to person, place, and time. Vital signs are normal. She appears well-developed and well-nourished. She is active and cooperative. She does not appear ill. No distress.  HENT:  Head: Normocephalic and atraumatic.  Mouth/Throat: Uvula is midline, oropharynx is clear and moist and mucous membranes are normal. Mucous membranes are not pale, not dry and not cyanotic.  Eyes: Conjunctivae, EOM and lids are normal. Pupils are equal, round, and reactive to light.  Neck: Trachea normal, normal range of motion and full passive range of motion without pain. Neck supple. No JVD present. No tracheal deviation, no edema and no erythema present. No thyromegaly present.  Cardiovascular: Normal rate, regular rhythm, normal heart sounds, intact distal pulses and normal pulses.  Exam reveals no gallop, no distant heart sounds and no friction rub.   No murmur heard. Pulses:      Dorsalis pedis pulses are 2+ on the right side, and 2+ on the  left side.  Pulmonary/Chest: Effort normal and breath sounds normal. No accessory muscle usage. No respiratory distress. She has no decreased breath sounds. She has no wheezes. She has no rhonchi. She has no rales. She exhibits no tenderness.  Abdominal: Normal appearance and bowel sounds are normal. She exhibits no distension and no ascites. There is no tenderness.  Musculoskeletal: She exhibits  no edema or tenderness.       Left hip: She exhibits decreased range of motion, decreased strength and swelling.  Expected osteoarthritis, stiffness. Calves soft, supple. Negative homan's sign  Neurological: She is alert and oriented to person, place, and time. She has normal strength.  Skin: Skin is warm, dry and intact. She is not diaphoretic. No cyanosis. No pallor. Nails show no clubbing.  Psychiatric: She has a normal mood and affect. Her speech is normal and behavior is normal. Judgment and thought content normal. Cognition and memory are normal.  Nursing note and vitals reviewed.   Labs reviewed:  Recent Labs  01/19/17 0521 01/20/17 0504 01/21/17 0455  NA 133* 134* 134*  K 3.2* 4.2 3.6  CL 97* 101 99*  CO2 GLUCOSE 101* 83 84  BUN 31* 21* 21*  CREATININE 1.97* 1.27* 1.22*  CALCIUM 8.2* 8.5* 8.6*   No results for input(s): AST, ALT, ALKPHOS, BILITOT, PROT, ALBUMIN in the last 8760 hours.  Recent Labs  01/16/17 1312  01/19/17 0521 01/20/17 0504 01/24/17 0645  WBC 13.1*  < > 9.5 9.3 8.0  NEUTROABS 10.4*  --   --   --  5.5  HGB 11.8*  < > 10.0* 11.1* 10.7*  HCT 36.1  < > 31.0* 34.4* 31.9*  MCV 91.9  < > 88.6 92.2 90.3  PLT 212  < > 150 154 224  < > = values in this interval not displayed. Lab Results  Component Value Date   TSH 1.209 01/19/2016   Lab Results  Component Value Date   HGBA1C (H) 04/05/2010    6.0 (NOTE)                                                                       According to the ADA Clinical Practice Recommendations for 2011, when HbA1c  is used as a screening test:   >=6.5%   Diagnostic of Diabetes Mellitus           (if abnormal result  is confirmed)  5.7-6.4%   Increased risk of developing Diabetes Mellitus  References:Diagnosis and Classification of Diabetes Mellitus,Diabetes Care,2011,34(Suppl 1):S62-S69 and Standards of Medical Care in         Diabetes - 2011,Diabetes Care,2011,34  (Suppl 1):S11-S61.   No results found for: CHOL, HDL, LDLCALC, LDLDIRECT, TRIG, CHOLHDL  Significant Diagnostic Results in last 30 days:  Dg Pelvis Portable  Result Date: 01/17/2017 CLINICAL DATA:  76 y/o  F; left hip arthroplasty. EXAM: PORTABLE PELVIS 1-2 VIEWS COMPARISON:  09/30/2015 pelvis CT FINDINGS: Bilateral hip arthroplasty and left proximal femur cerclage wires. Productive changes of the greater trochanters bilaterally. No acute fracture or dislocation is identified. There is a surgical injuring projecting over the left proximal femur. Extensive vascular calcifications. Right hemipelvis mesh anchors noted. IMPRESSION: Bilateral hip arthroplasty. No acute fracture or apparent hardware related complication identified. Electronically Signed   By: Mitzi Hansen M.D.   On: 01/17/2017 20:39   Dg Chest Port 1 View  Result Date: 01/19/2017 CLINICAL DATA:  Shortness of breath, cough and congestion for 2 days. EXAM: PORTABLE CHEST 1 VIEW COMPARISON:  01/16/2017 and prior exams FINDINGS: The cardiomediastinal silhouette is unremarkable. There is no evidence of focal airspace  disease, pulmonary edema, suspicious pulmonary nodule/mass, pleural effusion, or pneumothorax. No acute bony abnormalities are identified. IMPRESSION: No active disease. Electronically Signed   By: Harmon Pier M.D.   On: 01/19/2017 17:54   Dg Chest Port 1 View  Result Date: 01/16/2017 CLINICAL DATA:  Preop for left hip surgery.  Emphysema. EXAM: PORTABLE CHEST 1 VIEW COMPARISON:  Two-view chest x-ray 01/18/2016 FINDINGS: The heart size is exaggerate by low lung  volumes. Atherosclerotic calcifications are present at the aortic arch. There is no edema or effusion. No focal airspace disease is present. Degenerative changes are noted at the shoulders bilaterally. IMPRESSION: 1. Low lung volumes. 2. Cardiomegaly without failure. 3. Aortic atherosclerosis. Electronically Signed   By: Marin Roberts M.D.   On: 01/16/2017 17:37   Dg Hip Unilat With Pelvis 2-3 Views Left  Result Date: 01/16/2017 CLINICAL DATA:  Fall, left hip pain EXAM: DG HIP (WITH OR WITHOUT PELVIS) 2-3V LEFT COMPARISON:  None. FINDINGS: Right hip arthroplasty in satisfactory position. Left hip arthroplasty with anterior dislocation of the femoral head component relative to the acetabular cup. No fracture is seen. Degenerative changes of the lower lumbar spine. Vascular calcifications. Right inguinal hernia mesh repair. IMPRESSION: Left hip arthroplasty with anterior dislocation of the femoral head component relative to the acetabular cup. Electronically Signed   By: Charline Bills M.D.   On: 01/16/2017 13:05    Assessment/Plan 1. Failed total hip arthroplasty with dislocation, subsequent encounter  Continue participating in PT/OT  Continue Oxycodone 5 mg po Q 4 hours prn for Pain control  Ice prn for pain, edema  Hip precautions  2. Chronic diastolic CHF (congestive heart failure) (HCC)  Increase Lasix to 40 mg po Q Day  Check labs next week d/t increase in lasix  Family/ staff Communication:   Total Time:  Documentation:  Face to Face:  Family/Phone:   Labs/tests ordered:    Medication list reviewed and assessed for continued appropriateness.  Brynda Rim, NP-C Geriatrics [facility] [phone] [address] [place], [place] [address] Cell Phone (Mon-Fri 8am-5pm):  [phone] On Call:  [phone] & follow prompts after 5pm & weekends Office Phone:  [phone] Office Fax:  [phone]

## 2017-02-05 ENCOUNTER — Other Ambulatory Visit
Admission: RE | Admit: 2017-02-05 | Discharge: 2017-02-05 | Disposition: A | Discharge status: Home / Self Care | Payer: Medicare Other | Source: Ambulatory Visit | Attending: Gerontology | Admitting: Gerontology

## 2017-02-05 ENCOUNTER — Encounter
Admission: RE | Admit: 2017-02-05 | Discharge: 2017-02-05 | Disposition: A | Discharge status: Home / Self Care | Payer: Medicare Other | Source: Ambulatory Visit | Attending: Internal Medicine | Admitting: Internal Medicine

## 2017-02-05 DIAGNOSIS — I482 Chronic atrial fibrillation: Secondary | ICD-10-CM | POA: Insufficient documentation

## 2017-02-05 LAB — PROTIME-INR
INR: 2.38
Prothrombin Time: 26.4 seconds — ABNORMAL HIGH (ref 11.4–15.2)

## 2017-02-06 ENCOUNTER — Other Ambulatory Visit
Admission: RE | Admit: 2017-02-06 | Discharge: 2017-02-06 | Disposition: A | Discharge status: Home / Self Care | Payer: Medicare Other | Source: Ambulatory Visit | Attending: Gerontology | Admitting: Gerontology

## 2017-02-06 ENCOUNTER — Non-Acute Institutional Stay (SKILLED_NURSING_FACILITY): Payer: Medicare Other | Admitting: Gerontology

## 2017-02-06 DIAGNOSIS — E876 Hypokalemia: Secondary | ICD-10-CM | POA: Diagnosis not present

## 2017-02-06 DIAGNOSIS — I130 Hypertensive heart and chronic kidney disease with heart failure and stage 1 through stage 4 chronic kidney disease, or unspecified chronic kidney disease: Secondary | ICD-10-CM | POA: Diagnosis present

## 2017-02-06 DIAGNOSIS — I5032 Chronic diastolic (congestive) heart failure: Secondary | ICD-10-CM | POA: Diagnosis not present

## 2017-02-06 DIAGNOSIS — E871 Hypo-osmolality and hyponatremia: Secondary | ICD-10-CM | POA: Diagnosis not present

## 2017-02-06 DIAGNOSIS — Z96649 Presence of unspecified artificial hip joint: Secondary | ICD-10-CM | POA: Diagnosis not present

## 2017-02-06 DIAGNOSIS — T84028D Dislocation of other internal joint prosthesis, subsequent encounter: Secondary | ICD-10-CM

## 2017-02-06 LAB — CBC WITH DIFFERENTIAL/PLATELET
BASOS ABS: 0.1 10*3/uL (ref 0–0.1)
BASOS PCT: 1 %
EOS ABS: 0.1 10*3/uL (ref 0–0.7)
Eosinophils Relative: 2 %
HEMATOCRIT: 34 % — AB (ref 35.0–47.0)
HEMOGLOBIN: 11.4 g/dL — AB (ref 12.0–16.0)
Lymphocytes Relative: 19 %
Lymphs Abs: 1.5 10*3/uL (ref 1.0–3.6)
MCH: 30.4 pg (ref 26.0–34.0)
MCHC: 33.6 g/dL (ref 32.0–36.0)
MCV: 90.3 fL (ref 80.0–100.0)
MONOS PCT: 11 %
Monocytes Absolute: 0.8 10*3/uL (ref 0.2–0.9)
NEUTROS ABS: 5.2 10*3/uL (ref 1.4–6.5)
NEUTROS PCT: 67 %
Platelets: 263 10*3/uL (ref 150–440)
RBC: 3.77 MIL/uL — ABNORMAL LOW (ref 3.80–5.20)
RDW: 18.1 % — AB (ref 11.5–14.5)
WBC: 7.7 10*3/uL (ref 3.6–11.0)

## 2017-02-06 LAB — COMPREHENSIVE METABOLIC PANEL
ALBUMIN: 3.4 g/dL — AB (ref 3.5–5.0)
ALT: 17 U/L (ref 14–54)
ANION GAP: 7 (ref 5–15)
AST: 26 U/L (ref 15–41)
Alkaline Phosphatase: 137 U/L — ABNORMAL HIGH (ref 38–126)
BILIRUBIN TOTAL: 1.6 mg/dL — AB (ref 0.3–1.2)
BUN: 18 mg/dL (ref 6–20)
CO2: 35 mmol/L — AB (ref 22–32)
Calcium: 8.6 mg/dL — ABNORMAL LOW (ref 8.9–10.3)
Chloride: 90 mmol/L — ABNORMAL LOW (ref 101–111)
Creatinine, Ser: 1.33 mg/dL — ABNORMAL HIGH (ref 0.44–1.00)
GFR calc Af Amer: 44 mL/min — ABNORMAL LOW (ref >60)
GFR calc non Af Amer: 38 mL/min — ABNORMAL LOW (ref >60)
GLUCOSE: 78 mg/dL (ref 65–99)
POTASSIUM: 3.2 mmol/L — AB (ref 3.5–5.1)
SODIUM: 132 mmol/L — AB (ref 135–145)
Total Protein: 5.8 g/dL — ABNORMAL LOW (ref 6.5–8.1)

## 2017-02-06 LAB — PROTIME-INR
INR: 1.65
Prothrombin Time: 19.7 seconds — ABNORMAL HIGH (ref 11.4–15.2)

## 2017-02-12 NOTE — Progress Notes (Signed)
Location:      Place of Service:  SNF (31) Provider:  Lorenso Quarry, NP-C  Joanna Hews, MD  Patient Care Team: Joanna Hews, MD as PCP - General (Family Medicine)  Extended Emergency Contact Information Primary Emergency Contact: Drucie Opitz [place] Home Phone: [phone] Mobile Phone: [phone] Relation: Son Secondary Emergency Contact: Coralyn Helling [place] Home Phone: [phone] Mobile Phone: [phone] Relation: Spouse  Code Status:  dnr Goals of care: Advanced Directive information Advanced Directives 01/16/2017  Does Patient Have a Medical Advance Directive? No  Type of Advance Directive -  Does patient want to make changes to medical advance directive? -  Copy of Healthcare Power of Attorney in Chart? -  Would patient like information on creating a medical advance directive? No - Patient declined     Chief Complaint  Patient presents with  . Discharge Note    HPI:  Pt is a 76 y.o. female seen today for a discharge evaluation visit for increased edema in BLE (Chronic CHF), Failed Left total hip arthroplasty and electrolyte imbalances. Pt has a known h/o CHF. Pt also has recently undergone a closed reduction of a dislocated Total Hip Arthroplasty. Pt reports her pain is well controlled, but she is concerned about the increased edema of BLE, She reports the edema in causing her legs to be painful. Calves soft, supple. Negative Homan's sign of both calves. Otherwise, pt reports she is feeling well. Pt denies n/v/d/f/c/cp/sob/ha/abd pain/dizziness. No cough or congestion. IM lasix was given and po dose increased to daily. Pt reports edema is better. Labs drawn this morning reveal mild electrolyte imbalances. This was corrected with an extra dose of Potassium and encouraging to drink Gatorade some for sodium balance. Long discussion was had regarding slight bump in liver values, may be cause of labile INRs. Instructed pt to  f/u asap with cardiologist about this. Pt verbalized understanding. VSS. No other complaints.    Past Medical History:  Diagnosis Date  . Asthma   . Atrial fibrillation (HCC)   . Back pain   . Bronchitis    hx of  . CHF (congestive heart failure) (HCC)   . Chronic kidney disease   . Chronic respiratory failure (HCC)   . COPD (chronic obstructive pulmonary disease) (HCC)   . Emphysema lung (HCC)   . Hypertension    Past Surgical History:  Procedure Laterality Date  . ABDOMINAL HYSTERECTOMY    . PARTIAL HIP ARTHROPLASTY Right   . TOTAL HIP ARTHROPLASTY Left   . TOTAL HIP REVISION Left 01/17/2017   Procedure: LEFT TOTAL HIP REVISION;  Surgeon: Samson Frederic, MD;  Location: [facility];  Service: Orthopedics;  Laterality: Left;    Allergies  Allergen Reactions  . Amlodipine Swelling and Other (See Comments)    Reaction:  Lip/mouth swelling   . Hydralazine Swelling and Other (See Comments)    Reaction:  Lip/mouth swelling   . Iohexol Hives and Other (See Comments)    Desc: Pt developed hives after receiving iv dye for ct scan.  suggest she be premedicated for future exams, Onset Date: [id number]   . Metoprolol Swelling and Other (See Comments)    Reaction:  Lip/mouth swelling   . Olmesartan Swelling and Other (See Comments)    Reaction:  Lip/mouth swelling   . Ramipril Swelling and Other (See Comments)    Reaction:  Lip/mouth swelling   . Sulfa Antibiotics Swelling and Other (See Comments)    Reaction:  Lip/mouth  swelling   . Telmisartan Swelling and Other (See Comments)    Reaction:  Lip/mouth swelling   . Tiotropium Swelling and Other (See Comments)    Reaction:  Lip/mouth swelling     Allergies as of 02/06/2017      Reactions   Amlodipine Swelling, Other (See Comments)   Reaction:  Lip/mouth swelling    Hydralazine Swelling, Other (See Comments)   Reaction:  Lip/mouth swelling    Iohexol Hives, Other (See Comments)   Desc: Pt developed hives after receiving iv dye for ct  scan.  suggest she be premedicated for future exams, Onset Date: [id number]   Metoprolol Swelling, Other (See Comments)   Reaction:  Lip/mouth swelling    Olmesartan Swelling, Other (See Comments)   Reaction:  Lip/mouth swelling    Ramipril Swelling, Other (See Comments)   Reaction:  Lip/mouth swelling    Sulfa Antibiotics Swelling, Other (See Comments)   Reaction:  Lip/mouth swelling    Telmisartan Swelling, Other (See Comments)   Reaction:  Lip/mouth swelling    Tiotropium Swelling, Other (See Comments)   Reaction:  Lip/mouth swelling       Medication List       Accurate as of 02/06/17 11:59 PM. Always use your most recent med list.          albuterol (2.5 MG/3ML) 0.083% nebulizer solution Commonly known as:  PROVENTIL Inhale 3 mLs into the lungs every 4 (four) hours as needed for wheezing or shortness of breath.   albuterol-ipratropium 18-103 MCG/ACT inhaler Commonly known as:  COMBIVENT Inhale 1-2 puffs into the lungs every 4 (four) hours as needed for wheezing or shortness of breath.   aspirin EC 81 MG tablet Take 81 mg by mouth daily.   atenolol 25 MG tablet Commonly known as:  TENORMIN Take 25 mg by mouth daily.   atorvastatin 20 MG tablet Commonly known as:  LIPITOR Take 20 mg by mouth at bedtime.   BREO ELLIPTA 100-25 MCG/INH Aepb Generic drug:  fluticasone furoate-vilanterol Inhale 1 puff into the lungs daily.   cholecalciferol 1000 units tablet Commonly known as:  VITAMIN D Take 2,000 Units by mouth daily.   clonazePAM 1 MG tablet Commonly known as:  KLONOPIN Take 0.5-1 mg by mouth 2 (two) times daily. Pt takes one-half tablet in the morning, and one tablet at bedtime.   diltiazem 180 MG 24 hr capsule Commonly known as:  DILACOR XR Take 180 mg by mouth daily.   docusate sodium 100 MG capsule Commonly known as:  COLACE Take 1 capsule (100 mg total) by mouth 2 (two) times daily.   escitalopram 20 MG tablet Commonly known as:  LEXAPRO Take 20 mg by  mouth daily.   esomeprazole 20 MG capsule Commonly known as:  NEXIUM Take 20 mg by mouth 2 (two) times daily before a meal.   fluticasone 50 MCG/ACT nasal spray Commonly known as:  FLONASE Place 2 sprays into both nostrils daily as needed for rhinitis.   furosemide 40 MG tablet Commonly known as:  LASIX Take 1 tablet (40 mg total) by mouth every other day.   levothyroxine 50 MCG tablet Commonly known as:  SYNTHROID, LEVOTHROID Take 50 mcg by mouth daily before breakfast. Pt takes this dose on Sunday, Tuesday, Wednesday, Friday, and Saturday.   levothyroxine 75 MCG tablet Commonly known as:  SYNTHROID, LEVOTHROID Take 75 mcg by mouth daily before breakfast. Pt takes this dose on Monday and Thursday.   metolazone 2.5 MG tablet Commonly known as:  ZAROXOLYN Take 2.5 mg by mouth 2 (two) times a week. 2.5 mg every Monday & Thursday   montelukast 10 MG tablet Commonly known as:  SINGULAIR Take 10 mg by mouth at bedtime.   multivitamin with minerals Tabs tablet Take 1 tablet by mouth daily.   oxyCODONE 5 MG immediate release tablet Commonly known as:  Oxy IR/ROXICODONE Take 1-2 tablets (5-10 mg total) by mouth every 4 (four) hours as needed for moderate pain or severe pain.   potassium chloride SA 20 MEQ tablet Commonly known as:  K-DUR,KLOR-CON Take 1 tablet (20 mEq total) by mouth every other day.   predniSONE 5 MG tablet Commonly known as:  DELTASONE Take 5 mg by mouth daily with breakfast.   predniSONE 5 MG tablet Commonly known as:  DELTASONE Take 1 tablet (5 mg total) by mouth daily with breakfast.   senna 8.6 MG Tabs tablet Commonly known as:  SENOKOT Take 1 tablet (8.6 mg total) by mouth 2 (two) times daily.   tamsulosin 0.4 MG Caps capsule Commonly known as:  FLOMAX Take 0.4 mg by mouth daily.   warfarin 2 MG tablet Commonly known as:  COUMADIN Take 1-2 mg by mouth See admin instructions. 2 mg on mon,wed,fri,sun & 1 mg tues,thurs,sat       Review of  Systems  Constitutional: Negative for activity change, appetite change, chills, diaphoresis and fever.  HENT: Negative for congestion, sneezing, sore throat, trouble swallowing and voice change.   Respiratory: Negative for apnea, cough, choking, chest tightness, shortness of breath and wheezing.   Cardiovascular: Positive for leg swelling. Negative for chest pain and palpitations.  Gastrointestinal: Negative for abdominal distention, abdominal pain, constipation, diarrhea and nausea.  Genitourinary: Negative for difficulty urinating, dysuria, frequency and urgency.  Musculoskeletal: Positive for arthralgias (typical arthritis). Negative for back pain, gait problem and myalgias.  Skin: Negative for color change, pallor, rash and wound.  Neurological: Negative for dizziness, tremors, syncope, speech difficulty, weakness, numbness and headaches.  Psychiatric/Behavioral: Negative for agitation and behavioral problems.  All other systems reviewed and are negative.    There is no immunization history on file for this patient. Pertinent  Health Maintenance Due  Topic Date Due  . PNA vac Low Risk Adult (1 of 2 - PCV13) 02/04/2006  . INFLUENZA VACCINE  05/08/2017  . DEXA SCAN  Completed   No flowsheet data found. Functional Status Survey:    Vitals:   02/06/17 1100  BP: 105/60  Pulse: 69  Resp: 20  Temp: 97.4 F (36.3 C)  SpO2: 98%  Weight: 233 lb 9.6 oz (106 kg)   Body mass index is 41.38 kg/m. Physical Exam  Constitutional: She is oriented to person, place, and time. Vital signs are normal. She appears well-developed and well-nourished. She is active and cooperative. She does not appear ill. No distress.  HENT:  Head: Normocephalic and atraumatic.  Mouth/Throat: Uvula is midline, oropharynx is clear and moist and mucous membranes are normal. Mucous membranes are not pale, not dry and not cyanotic.  Eyes: Conjunctivae, EOM and lids are normal. Pupils are equal, round, and reactive  to light.  Neck: Trachea normal, normal range of motion and full passive range of motion without pain. Neck supple. No JVD present. No tracheal deviation, no edema and no erythema present. No thyromegaly present.  Cardiovascular: Normal rate, regular rhythm, normal heart sounds, intact distal pulses and normal pulses.  Exam reveals no gallop, no distant heart sounds and no friction rub.   No murmur  heard. Pulses:      Dorsalis pedis pulses are 2+ on the right side, and 2+ on the left side.  Pulmonary/Chest: Effort normal and breath sounds normal. No accessory muscle usage. No respiratory distress. She has no decreased breath sounds. She has no wheezes. She has no rhonchi. She has no rales. She exhibits no tenderness.  Abdominal: Normal appearance and bowel sounds are normal. She exhibits no distension and no ascites. There is no tenderness.  Musculoskeletal: She exhibits no edema or tenderness.       Left hip: She exhibits decreased range of motion, decreased strength and swelling.  Expected osteoarthritis, stiffness. Calves soft, supple. Negative homan's sign  Neurological: She is alert and oriented to person, place, and time. She has normal strength.  Skin: Skin is warm, dry and intact. She is not diaphoretic. No cyanosis. No pallor. Nails show no clubbing.  Psychiatric: She has a normal mood and affect. Her speech is normal and behavior is normal. Judgment and thought content normal. Cognition and memory are normal.  Nursing note and vitals reviewed.   Labs reviewed:  Recent Labs  01/20/17 0504 01/21/17 0455 02/06/17 0700  NA 134* 134* 132*  K 4.2 3.6 3.2*  CL 101 99* 90*  CO2 24 27 35*  GLUCOSE 83 84 78  BUN 21* 21* 18  CREATININE 1.27* 1.22* 1.33*  CALCIUM 8.5* 8.6* 8.6*    Recent Labs  02/06/17 0700  AST 26  ALT 17  ALKPHOS 137*  BILITOT 1.6*  PROT 5.8*  ALBUMIN 3.4*    Recent Labs  01/16/17 1312  01/20/17 0504 01/24/17 0645 02/06/17 0700  WBC 13.1*  < > 9.3  8.0 7.7  NEUTROABS 10.4*  --   --  5.5 5.2  HGB 11.8*  < > 11.1* 10.7* 11.4*  HCT 36.1  < > 34.4* 31.9* 34.0*  MCV 91.9  < > 92.2 90.3 90.3  PLT 212  < > 154 224 263  < > = values in this interval not displayed. Lab Results  Component Value Date   TSH 1.209 01/19/2016   Lab Results  Component Value Date   HGBA1C (H) 04/05/2010    6.0 (NOTE)                                                                       According to the ADA Clinical Practice Recommendations for 2011, when HbA1c is used as a screening test:   >=6.5%   Diagnostic of Diabetes Mellitus           (if abnormal result  is confirmed)  5.7-6.4%   Increased risk of developing Diabetes Mellitus  References:Diagnosis and Classification of Diabetes Mellitus,Diabetes Care,2011,34(Suppl 1):S62-S69 and Standards of Medical Care in         Diabetes - 2011,Diabetes Care,2011,34  (Suppl 1):S11-S61.   No results found for: CHOL, HDL, LDLCALC, LDLDIRECT, TRIG, CHOLHDL  Significant Diagnostic Results in last 30 days:  Dg Pelvis Portable  Result Date: 01/17/2017 CLINICAL DATA:  76 y/o  F; left hip arthroplasty. EXAM: PORTABLE PELVIS 1-2 VIEWS COMPARISON:  09/30/2015 pelvis CT FINDINGS: Bilateral hip arthroplasty and left proximal femur cerclage wires. Productive changes of the greater trochanters bilaterally. No acute fracture or dislocation is identified. There  is a surgical injuring projecting over the left proximal femur. Extensive vascular calcifications. Right hemipelvis mesh anchors noted. IMPRESSION: Bilateral hip arthroplasty. No acute fracture or apparent hardware related complication identified. Electronically Signed   By: Mitzi HansenLance  Furusawa-Stratton M.D.   On: 01/17/2017 20:39   Dg Chest Port 1 View  Result Date: 01/19/2017 CLINICAL DATA:  Shortness of breath, cough and congestion for 2 days. EXAM: PORTABLE CHEST 1 VIEW COMPARISON:  01/16/2017 and prior exams FINDINGS: The cardiomediastinal silhouette is unremarkable. There is no  evidence of focal airspace disease, pulmonary edema, suspicious pulmonary nodule/mass, pleural effusion, or pneumothorax. No acute bony abnormalities are identified. IMPRESSION: No active disease. Electronically Signed   By: Harmon PierJeffrey  Hu M.D.   On: 01/19/2017 17:54   Dg Chest Port 1 View  Result Date: 01/16/2017 CLINICAL DATA:  Preop for left hip surgery.  Emphysema. EXAM: PORTABLE CHEST 1 VIEW COMPARISON:  Two-view chest x-ray 01/18/2016 FINDINGS: The heart size is exaggerate by low lung volumes. Atherosclerotic calcifications are present at the aortic arch. There is no edema or effusion. No focal airspace disease is present. Degenerative changes are noted at the shoulders bilaterally. IMPRESSION: 1. Low lung volumes. 2. Cardiomegaly without failure. 3. Aortic atherosclerosis. Electronically Signed   By: Marin Robertshristopher  Mattern M.D.   On: 01/16/2017 17:37   Dg Hip Unilat With Pelvis 2-3 Views Left  Result Date: 01/16/2017 CLINICAL DATA:  Fall, left hip pain EXAM: DG HIP (WITH OR WITHOUT PELVIS) 2-3V LEFT COMPARISON:  None. FINDINGS: Right hip arthroplasty in satisfactory position. Left hip arthroplasty with anterior dislocation of the femoral head component relative to the acetabular cup. No fracture is seen. Degenerative changes of the lower lumbar spine. Vascular calcifications. Right inguinal hernia mesh repair. IMPRESSION: Left hip arthroplasty with anterior dislocation of the femoral head component relative to the acetabular cup. Electronically Signed   By: Charline BillsSriyesh  Krishnan M.D.   On: 01/16/2017 13:05    Assessment/Plan 1. Failed total hip arthroplasty with dislocation, subsequent encounter  Continue participating in PT/OT  Continue Oxycodone 5 mg po Q 4 hours prn for Pain control  Ice prn for pain, edema  Hip precautions  Follow up with Orthopedist asap after discharge for continuity of care   2. Chronic diastolic CHF (congestive heart failure) (HCC)  Increase Lasix to 40 mg po Q  Day  Check labs next week d/t increase in lasix  follow up with Cardiologist and pcp asap after discharge for continuity of care   3. Hypokalemia  Change Potassium Chloride to 20 meq po Q Day  Give extra dose today of Potassium Chloride 20 meq- 2 tablets x 1 now  4. Hyponatremia  Mild  Encourage po intake of Gatorade, less plain water  Family/ staff Communication:   Total Time: 35 minutes  Documentation: 10 minutes  Face to Face: 25 minutes  Family/Phone:   Labs/tests ordered:  PT/INR within 3 days  Patient is being discharged with the following home health services: HHPT/OT/Nsg- NSG to follow INRs closely  Patient is being discharged with the following durable medical equipment: none   Patient has been advised to f/u with their PCP in 1-2 weeks to bring them up to date on their rehab stay.  Social services at facility was responsible for arranging this appointment.  Pt was provided with a 30 day supply of prescriptions for medications and refills must be obtained from their PCP.  For controlled substances, a more limited supply may be provided adequate until PCP appointment only.  Medication  list reviewed and assessed for continued appropriateness.  Vikki Ports, NP-C Geriatrics [facility] [phone], [place] [address] Cell Phone (Mon-Fri 8am-5pm):  [phone] On Call:  [phone] & follow prompts after 5pm & weekends Office Phone:  [phone] Office Fax:  [phone]

## 2017-03-15 NOTE — Addendum Note (Signed)
Addendum  created 03/15/17 1159 by Johnatan Baskette, MD   Sign clinical note    

## 2017-11-29 ENCOUNTER — Emergency Department (HOSPITAL_COMMUNITY)
Admission: EM | Admit: 2017-11-29 | Discharge: 2017-11-29 | Disposition: A | Discharge status: Home / Self Care | Payer: Medicare Other | Attending: Emergency Medicine | Admitting: Emergency Medicine

## 2017-11-29 ENCOUNTER — Emergency Department (HOSPITAL_COMMUNITY): Payer: Medicare Other

## 2017-11-29 ENCOUNTER — Encounter (HOSPITAL_COMMUNITY): Payer: Self-pay | Admitting: Internal Medicine

## 2017-11-29 DIAGNOSIS — R05 Cough: Secondary | ICD-10-CM | POA: Insufficient documentation

## 2017-11-29 DIAGNOSIS — I5032 Chronic diastolic (congestive) heart failure: Secondary | ICD-10-CM | POA: Insufficient documentation

## 2017-11-29 DIAGNOSIS — M25532 Pain in left wrist: Secondary | ICD-10-CM | POA: Insufficient documentation

## 2017-11-29 DIAGNOSIS — T07XXXA Unspecified multiple injuries, initial encounter: Secondary | ICD-10-CM | POA: Insufficient documentation

## 2017-11-29 DIAGNOSIS — Y9389 Activity, other specified: Secondary | ICD-10-CM | POA: Diagnosis not present

## 2017-11-29 DIAGNOSIS — Z96642 Presence of left artificial hip joint: Secondary | ICD-10-CM | POA: Diagnosis not present

## 2017-11-29 DIAGNOSIS — Z96641 Presence of right artificial hip joint: Secondary | ICD-10-CM | POA: Insufficient documentation

## 2017-11-29 DIAGNOSIS — Y92010 Kitchen of single-family (private) house as the place of occurrence of the external cause: Secondary | ICD-10-CM | POA: Diagnosis not present

## 2017-11-29 DIAGNOSIS — Z7982 Long term (current) use of aspirin: Secondary | ICD-10-CM | POA: Insufficient documentation

## 2017-11-29 DIAGNOSIS — J449 Chronic obstructive pulmonary disease, unspecified: Secondary | ICD-10-CM | POA: Diagnosis not present

## 2017-11-29 DIAGNOSIS — Z7901 Long term (current) use of anticoagulants: Secondary | ICD-10-CM | POA: Diagnosis not present

## 2017-11-29 DIAGNOSIS — Y998 Other external cause status: Secondary | ICD-10-CM | POA: Insufficient documentation

## 2017-11-29 DIAGNOSIS — Z79899 Other long term (current) drug therapy: Secondary | ICD-10-CM | POA: Diagnosis not present

## 2017-11-29 DIAGNOSIS — M25512 Pain in left shoulder: Secondary | ICD-10-CM | POA: Insufficient documentation

## 2017-11-29 DIAGNOSIS — S72102A Unspecified trochanteric fracture of left femur, initial encounter for closed fracture: Secondary | ICD-10-CM

## 2017-11-29 DIAGNOSIS — N189 Chronic kidney disease, unspecified: Secondary | ICD-10-CM | POA: Diagnosis not present

## 2017-11-29 DIAGNOSIS — J45909 Unspecified asthma, uncomplicated: Secondary | ICD-10-CM | POA: Diagnosis not present

## 2017-11-29 DIAGNOSIS — S79912A Unspecified injury of left hip, initial encounter: Secondary | ICD-10-CM | POA: Diagnosis present

## 2017-11-29 DIAGNOSIS — W19XXXA Unspecified fall, initial encounter: Secondary | ICD-10-CM | POA: Insufficient documentation

## 2017-11-29 DIAGNOSIS — I130 Hypertensive heart and chronic kidney disease with heart failure and stage 1 through stage 4 chronic kidney disease, or unspecified chronic kidney disease: Secondary | ICD-10-CM | POA: Diagnosis not present

## 2017-11-29 LAB — PROTIME-INR
INR: 2.01
Prothrombin Time: 22.6 seconds — ABNORMAL HIGH (ref 11.4–15.2)

## 2017-11-29 NOTE — ED Notes (Signed)
Bed: [facility] Expected date:  Expected time:  Means of arrival:  Comments: EMS-fall 

## 2017-11-29 NOTE — ED Notes (Signed)
This RN attempted to obtain labs x2. Morrie SheldonAshley, RN at the bedside attempting currently.

## 2017-11-29 NOTE — ED Notes (Signed)
ED Provider at Bedside 

## 2017-11-29 NOTE — ED Provider Notes (Signed)
[facility]-EMERGENCY DEPT Provider Note   CSN: [id number] Arrival date & time: 11/29/17  1426     History   Chief Complaint Chief Complaint  Patient presents with  . Fall    HPI Maureen Wilson is a 77 y.o. female.  PT is a 76yo with a hx of CHF, a-fib on coumadin, COPD on home oxygen, HTN and CKD who presents after a fall.  She has had a cough for several days and recently saw her PCP who said she had sinusitis and started her on abx.  She was having a coughing spell in the kitchen and lost her breath, causing her to fall over on her left side.  She complains of pain to her left shoulder, left wrist and left hip.  Says it hurts to walk on her hip.  No neck or back pain.  Not sure if she hit her head when she fell.  Has hx of prior left hip replacement and subsequent dislocations, requiring a locking ring to be placed.  She has not taken anything at home for the pain and denies the need for current pain meds.      Past Medical History:  Diagnosis Date  . Asthma   . Atrial fibrillation (HCC)   . Back pain   . Bronchitis    hx of  . CHF (congestive heart failure) (HCC)   . Chronic kidney disease   . Chronic respiratory failure (HCC)   . COPD (chronic obstructive pulmonary disease) (HCC)   . Emphysema lung (HCC)   . Hypertension     Patient Active Problem List   Diagnosis Date Noted  . 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 Closed dislocation of left hip (HCC) 01/16/2017  . A-fib (HCC) 01/16/2017  . Chronic diastolic CHF (congestive heart failure) (HCC) 01/16/2017  . COPD with chronic bronchitis (HCC) 01/16/2017  . Grief 01/19/2016  . Depression, major, recurrent, moderate (HCC) 01/19/2016  . ARF (acute renal failure) (HCC) 01/18/2016  . Sepsis (HCC) 01/18/2016  . Aspiration pneumonia (HCC) 10/01/2015  . Fall 09/29/2015  . Gait instability 09/29/2015    Past Surgical History:  Procedure Laterality Date  .  ABDOMINAL HYSTERECTOMY    . PARTIAL HIP ARTHROPLASTY Right   . TOTAL HIP ARTHROPLASTY Left   . TOTAL HIP REVISION Left 01/17/2017   Procedure: LEFT TOTAL HIP REVISION;  Surgeon: Samson Frederic, MD;  Location: [facility];  Service: Orthopedics;  Laterality: Left;    OB History    No data available       Home Medications    Prior to Admission medications   Medication Sig Start Date End Date Taking? Authorizing Provider  albuterol (PROVENTIL) (2.5 MG/3ML) 0.083% nebulizer solution Inhale 3 mLs into the lungs every 4 (four) hours as needed for wheezing or shortness of breath.    Yes [provider]  albuterol-ipratropium (COMBIVENT) 18-103 MCG/ACT inhaler Inhale 1-2 puffs into the lungs every 4 (four) hours as needed for wheezing or shortness of breath.    Yes [provider]  ALPRAZolam (XANAX) 0.25 MG tablet Take 0.25 mg by mouth daily as needed. ANXIETY 11/07/17  Yes [provider]  aspirin EC 81 MG tablet Take 81 mg by mouth daily.   Yes [provider]  atenolol (TENORMIN) 25 MG tablet Take 25 mg by mouth daily.    Yes [provider]  atorvastatin (LIPITOR) 20 MG tablet Take 20 mg by mouth at bedtime.  Yes [provider]  cefdinir (OMNICEF) 300 MG capsule Take 300 mg by mouth 3 (three) times daily. 11/27/17  Yes [provider]  cholecalciferol (VITAMIN D) 1000 units tablet Take 2,000 Units by mouth daily.   Yes [provider]  clonazePAM (KLONOPIN) 1 MG tablet Take 0.5-1 mg by mouth 2 (two) times daily. Pt takes one-half tablet in the morning, and one tablet at bedtime.   Yes [provider]  diltiazem (DILACOR XR) 180 MG 24 hr capsule Take 180 mg by mouth daily.   Yes [provider]  escitalopram (LEXAPRO) 20 MG tablet Take 20 mg by mouth daily.   Yes [provider]  esomeprazole (NEXIUM) 20 MG capsule Take 20 mg by mouth 2 (two) times daily before a meal.   Yes [provider]    fluticasone (FLONASE) 50 MCG/ACT nasal spray Place 2 sprays into both nostrils daily as needed for rhinitis.    Yes [provider]  fluticasone furoate-vilanterol (BREO ELLIPTA) 100-25 MCG/INH AEPB Inhale 1 puff into the lungs daily. 03/27/16  Yes [provider]  furosemide (LASIX) 40 MG tablet Take 1 tablet (40 mg total) by mouth every other day. Patient taking differently: Take 40 mg by mouth See admin instructions. Takes sun,tue,thur,sat 01/21/16  Yes Enedina FinnerPatel, Sona, MD  levothyroxine (SYNTHROID, LEVOTHROID) 50 MCG tablet Take 50 mcg by mouth daily before breakfast. Pt takes this dose on Sunday, Tuesday, Wednesday, Friday, and Saturday.   Yes [provider]  metolazone (ZAROXOLYN) 2.5 MG tablet Take 2.5 mg by mouth 2 (two) times a week. 2.5 mg every Monday & Thursday 11/01/16  Yes [provider]  montelukast (SINGULAIR) 10 MG tablet Take 10 mg by mouth at bedtime.   Yes [provider]  Multiple Vitamin (MULTIVITAMIN WITH MINERALS) TABS tablet Take 1 tablet by mouth daily.   Yes [provider]  Multiple Vitamins-Minerals (CENTRUM SILVER PO) Take 1 tablet by mouth daily.   Yes [provider]  potassium chloride SA (K-DUR,KLOR-CON) 20 MEQ tablet Take 1 tablet (20 mEq total) by mouth every other day. Patient taking differently: Take 10-20 mEq by mouth 2 (two) times daily. 10 meq every morning and 20 meq every evening 01/21/16  Yes Enedina FinnerPatel, Sona, MD  predniSONE (DELTASONE) 10 MG tablet Take 10 mg by mouth 4 (four) times daily. 11/27/17  Yes [provider]  predniSONE (DELTASONE) 5 MG tablet Take 5 mg by mouth daily with breakfast.   Yes [provider]  predniSONE (DELTASONE) 5 MG tablet Take 1 tablet (5 mg total) by mouth daily with breakfast. 01/21/17  Yes Jerald Kiefhiu, Stephen K, MD  tamsulosin (FLOMAX) 0.4 MG CAPS capsule Take 0.4 mg by mouth daily.    Yes [provider]  warfarin (COUMADIN) 2 MG tablet Take 1-2 mg by  mouth See admin instructions. 2 mg on mon,wed,fri,sun & 1 mg tues,thurs,sat 12/12/16  Yes [provider]  docusate sodium (COLACE) 100 MG capsule Take 1 capsule (100 mg total) by mouth 2 (two) times daily. Patient not taking: Reported on 11/29/2017 01/21/17   Jerald Kiefhiu, Stephen K, MD  oxyCODONE (OXY IR/ROXICODONE) 5 MG immediate release tablet Take 1-2 tablets (5-10 mg total) by mouth every 4 (four) hours as needed for moderate pain or severe pain. Patient not taking: Reported on 11/29/2017 01/21/17   Jerald Kiefhiu, Stephen K, MD  senna (SENOKOT) 8.6 MG TABS tablet Take 1 tablet (8.6 mg total) by mouth 2 (two) times daily. 01/21/17   Jerald Kiefhiu, Stephen K,  MD    Family History Family History  Problem Relation Age of Onset  . Hypertension Son     Social History Social History   Tobacco Use  . Smoking status: Never Smoker  . Smokeless tobacco: Never Used  Substance Use Topics  . Alcohol use: No    Alcohol/week: 0.0 oz  . Drug use: No     Allergies   Amlodipine; Hydralazine; Iohexol; Metoprolol; Olmesartan; Other; Ramipril; Risperidone; Sulfa antibiotics; Telmisartan; and Tiotropium   Review of Systems Review of Systems  Constitutional: Negative for chills, diaphoresis, fatigue and fever.  HENT: Negative for congestion, rhinorrhea and sneezing.   Eyes: Negative.   Respiratory: Positive for cough. Negative for chest tightness and shortness of breath.   Cardiovascular: Negative for chest pain and leg swelling.  Gastrointestinal: Negative for abdominal pain, blood in stool, diarrhea, nausea and vomiting.  Genitourinary: Negative for difficulty urinating, flank pain, frequency and hematuria.  Musculoskeletal: Positive for arthralgias. Negative for back pain, joint swelling and neck pain.  Skin: Negative for rash and wound.  Neurological: Negative for dizziness, speech difficulty, weakness, numbness and headaches.     Physical Exam Updated Vital Signs BP (!) 105/45 (BP Location: Right Arm)    Pulse 89   Temp 98.4 F (36.9 C) (Oral)   Resp 20   SpO2 97%   Physical Exam  Constitutional: She is oriented to person, place, and time. She appears well-developed and well-nourished.  HENT:  Head: Normocephalic and atraumatic.  Eyes: Pupils are equal, round, and reactive to light.  Neck: Normal range of motion. Neck supple.  Cardiovascular: Normal rate, regular rhythm and normal heart sounds.  Pulmonary/Chest: Effort normal and breath sounds normal. No respiratory distress. She has no wheezes. She has no rales. She exhibits no tenderness.  Few rhonchi  Abdominal: Soft. Bowel sounds are normal. There is no tenderness. There is no rebound and no guarding.  Musculoskeletal: Normal range of motion. She exhibits no edema.  Some ecchymosis to left arm.  Pt has pain to left wrist and left anterior shoulder.  No pain to elbow, hand or remainder of arm.  Radial pulses intact.   No deformity or rotation of hip.  Pedal pulses intact.  Normal sensation and motor function to extremities.  Lymphadenopathy:    She has no cervical adenopathy.  Neurological: She is alert and oriented to person, place, and time.  Skin: Skin is warm and dry. No rash noted.  Psychiatric: She has a normal mood and affect.     ED Treatments / Results  Labs (all labs ordered are listed, but only abnormal results are displayed) Labs Reviewed  PROTIME-INR - Abnormal; Notable for the following components:      Result Value   Prothrombin Time 22.6 (*)    All other components within normal limits    EKG  EKG Interpretation None       Radiology Dg Chest 2 View  Result Date: 11/29/2017 CLINICAL DATA:  Cough for 2 days EXAM: CHEST  2 VIEW COMPARISON:  01/19/2017 FINDINGS: Mild cardiomegaly with aortic atherosclerosis. No focal consolidation or effusion. Linear scarring in the left mid lung. No pleural effusion. No pneumothorax. IMPRESSION: No active cardiopulmonary disease.  Cardiomegaly. Electronically Signed   By:  Jasmine Pang M.D.   On: 11/29/2017 18:24   Dg Wrist Complete Left  Result Date: 11/29/2017 CLINICAL DATA:  Left wrist pain after fall today. EXAM: LEFT WRIST - COMPLETE 3+ VIEW COMPARISON:  Radiographs of June 09, 2011. FINDINGS: Severe  degenerative changes seen between the scaphoid and trapezium and trapezoid bones. Vascular calcifications are noted. No definite fracture is noted. Dislocation of the first metacarpophalangeal joint is noted which may be chronic. IMPRESSION: No fracture is noted. Degenerative changes are noted as described above. Dislocation of the first metacarpophalangeal joint is noted of indeterminate age. Electronically Signed   By: Lupita Raider, M.D.   On: 11/29/2017 16:27   Ct Head Wo Contrast  Result Date: 11/29/2017 CLINICAL DATA:  Status post fall today.  The patient is on Coumadin. EXAM: CT HEAD WITHOUT CONTRAST TECHNIQUE: Contiguous axial images were obtained from the base of the skull through the vertex without intravenous contrast. COMPARISON:  Head CT scan 09/28/2017. 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 No evidence of acute abnormality including hemorrhage, infarct, mass lesion, mass effect, midline shift or abnormal extra-axial fluid collection. No hydrocephalus or pneumocephalus. Vascular: Atherosclerosis noted. Skull: Intact. Sinuses/Orbits: Negative. Other: None. IMPRESSION: No acute abnormality or change compared to the prior exam. Atrophy, chronic microvascular ischemic change and remote infarct subcortical right frontal lobe. Electronically Signed   By: Drusilla Kanner M.D.   On: 11/29/2017 16:03   Dg Shoulder Left  Result Date: 11/29/2017 CLINICAL DATA:  Left shoulder pain after fall. EXAM: LEFT SHOULDER - 2+ VIEW COMPARISON:  None. FINDINGS: No definite fracture is noted. Severe narrowing of the left subacromial space is noted consistent with rotator  cuff injury. No definite dislocation is noted. Visualized ribs appear normal. IMPRESSION: Severe narrowing of left subacromial space is noted consistent with rotator cuff injury. No acute abnormality is noted in the left shoulder. Electronically Signed   By: Lupita Raider, M.D.   On: 11/29/2017 16:29   Dg Hip Unilat W Or Wo Pelvis 2-3 Views Left  Result Date: 11/29/2017 CLINICAL DATA:  Fall with left hip pain EXAM: DG HIP (WITH OR WITHOUT PELVIS) 2-3V LEFT COMPARISON:  Hip radiograph 01/16/2017 FINDINGS: Bilateral hip arthroplasties are proximity it. There is a nondisplaced fracture of the left greater trochanter. No other acute fracture. No abnormal lucency surrounding the femoral or acetabular components of the left hip arthroplasty. IMPRESSION: 1. Nondisplaced fracture of the left greater trochanter. 2. No adverse features of bilateral hip arthroplasties Electronically Signed   By: Deatra Robinson M.D.   On: 11/29/2017 16:35    Procedures Procedures (including critical care time)  Medications Ordered in ED Medications - No data to display   Initial Impression / Assessment and Plan / ED Course  I have reviewed the triage vital signs and the nursing notes.  Pertinent labs & imaging results that were available during my care of the patient were reviewed by me and considered in my medical decision making (see chart for details).     Patient is a 77 year old female who had a mechanical fall related to a coughing spell.  Her breathing is back to baseline now.  She is oxygen dependent at home but is maintaining normal oxygen saturations on her home oxygen.  Her chest x-ray is clear without evidence of pneumonia.  Given her anticoagulated state, I did do a head CT which is negative for intracranial hemorrhage.  X-rays of her extremities reveal a greater trochanteric fracture of the left hip.  I spoke with Dr. supple with [facility] orthopedics who has reviewed the films and feels that this is a  stable fracture.  Patient can ambulate with a walker.  Other x-rays do  not show any acute abnormalities.  There is a subluxation at the left first MCP joint which appears to be chronic as patient has no pain in this area.  Patient feels comfortable going home.  She is able to stand and bear weight on the leg.  She has a walker to use at home.  She has an elevated toilet and a lift chair which will assist her.  I also did a case management consult for home health.  Patient was discharged home in good condition.  She will make a follow-up appointment with Dr. Linna Caprice.  Return precautions were given.  Final Clinical Impressions(s) / ED Diagnoses   Final diagnoses:  Fall, initial encounter  Closed fracture of trochanter of left femur, initial encounter [facility])  Multiple contusions    ED Discharge Orders    None       Rolan Bucco, MD 11/29/17 1904

## 2017-11-29 NOTE — ED Notes (Signed)
ED Provider at bedside. 

## 2017-11-29 NOTE — ED Notes (Signed)
Patient transported to X-ray 

## 2017-11-29 NOTE — ED Triage Notes (Signed)
[facility] after falling this afternoon. Patient was taking her medications, coughed, then fell on her left side. Per EMS, patient c/o left hip and left forearm pain. Pt is on coumadin for hx of a-fib and uses 2L O2 at home.

## 2017-12-01 ENCOUNTER — Telehealth: Payer: Self-pay | Admitting: Surgery

## 2017-12-01 NOTE — Telephone Encounter (Signed)
ED CM contacted patient and spouse by phone concerning HH referral.  Explained EDP recommended HHPT, EDP also recommended patient follow up with [facility]. Linna [facility]riceSwinteck [facility]. CM suggest that patient contact [facility] tomorrow morning 2/25 after 8am to schedule follow up before HHPT recommendation patient and spouse agreeable.  No further ED CM needs identified

## 2019-05-12 ENCOUNTER — Ambulatory Visit
Admission: EM | Admit: 2019-05-12 | Discharge: 2019-05-12 | Disposition: A | Discharge status: Home / Self Care | Payer: Medicare Other | Attending: Family Medicine | Admitting: Family Medicine

## 2019-05-12 ENCOUNTER — Encounter: Payer: Self-pay | Admitting: Emergency Medicine

## 2019-05-12 ENCOUNTER — Other Ambulatory Visit: Payer: Self-pay

## 2019-05-12 DIAGNOSIS — M150 Primary generalized (osteo)arthritis: Secondary | ICD-10-CM | POA: Diagnosis not present

## 2019-05-12 DIAGNOSIS — M159 Polyosteoarthritis, unspecified: Secondary | ICD-10-CM

## 2019-05-12 MED ORDER — PREDNISONE 10 MG PO TABS: ORAL_TABLET | ORAL | 0 refills | Status: DC

## 2019-05-12 NOTE — ED Triage Notes (Signed)
Pt c/o right index finger pain, redness and swelling. Started about 3-4 days ago. No known injury. She has multiple joint deformability but this finger is more painful.

## 2019-05-12 NOTE — Discharge Instructions (Signed)
Prednisone burst, then back to 5 mg daily.  Take care  Dr. Lacinda Axon

## 2019-05-12 NOTE — ED Provider Notes (Signed)
[facility]    CSN: [id number] Arrival date & time: 05/12/19  5188  History   Chief Complaint Chief Complaint  Patient presents with   Hand Pain    right index finger   HPI  78 year old female presents with the above complaint.  Patient has known osteoarthritis.  Patient reports a 3 to 4-day history of swelling and increased pain of the DIP joint of her right index finger.  She states that it is red and more swollen.  It is quite painful.  Pain is currently 5/10 in severity.  No recent fall, trauma, injury.  Exacerbated by activity.  No relieving factors.  Patient is on chronic prednisone due to underlying COPD.  No other reported symptoms.  No other complaints.  PMH, Surgical Hx, Family Hx, Social History reviewed and updated as below.  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   Closed dislocation of left hip ([place]) 01/16/2017   A-fib ([place]) 01/16/2017   Chronic diastolic CHF (congestive heart failure) ([place]) 01/16/2017   COPD with chronic bronchitis ([place]) 01/16/2017   Grief 01/19/2016   Depression, major, recurrent, moderate ([place]) 01/19/2016   ARF (acute renal failure) ([place]) 01/18/2016   Sepsis ([place]) 01/18/2016   Aspiration pneumonia ([place]) 10/01/2015   Fall 09/29/2015   Gait instability 09/29/2015   Past Surgical History:  Procedure Laterality Date   ABDOMINAL HYSTERECTOMY     PARTIAL HIP ARTHROPLASTY Right    TOTAL HIP ARTHROPLASTY Left    TOTAL HIP REVISION Left 01/17/2017   Procedure: LEFT TOTAL HIP REVISION;  Surgeon: Rod Can, MD;   Location: [facility];  Service: Orthopedics;  Laterality: Left;   OB History   No obstetric history on file.    Home Medications    Prior to Admission medications   Medication Sig Start Date End Date Taking? Authorizing Provider  albuterol (PROVENTIL) (2.5 MG/3ML) 0.083% nebulizer solution Inhale 3 mLs into the lungs every 4 (four) hours as needed for wheezing or shortness of breath.    Yes [provider]  albuterol-ipratropium (COMBIVENT) 18-103 MCG/ACT inhaler Inhale 1-2 puffs into the lungs every 4 (four) hours as needed for wheezing or shortness of breath.    Yes [provider]  ALPRAZolam (XANAX) 0.25 MG tablet Take 0.25 mg by mouth daily as needed. ANXIETY 11/07/17  Yes [provider]  atenolol (TENORMIN) 25 MG tablet Take 25 mg by mouth daily.    Yes [provider]  atorvastatin (LIPITOR) 20 MG tablet Take 20 mg by mouth at bedtime.    Yes [provider]  cholecalciferol (VITAMIN D) 1000 units tablet Take 2,000 Units by mouth daily.   Yes [provider]  clonazePAM (KLONOPIN) 1 MG tablet Take 0.5-1 mg by mouth 2 (two) times daily. Pt takes one-half tablet in the morning, and one tablet at bedtime.   Yes [provider]  diltiazem (DILACOR XR) 180 MG 24 hr capsule Take 180 mg by mouth daily.   Yes [provider]  docusate sodium (COLACE)  100 MG capsule Take 1 capsule (100 mg total) by mouth 2 (two) times daily. 01/21/17  Yes Jerald Kiefhiu, Stephen K, MD  escitalopram (LEXAPRO) 20 MG tablet Take 20 mg by mouth daily.   Yes [provider]  esomeprazole (NEXIUM) 20 MG capsule Take 20 mg by mouth 2 (two) times daily before a meal.   Yes [provider]  fluticasone (FLONASE) 50 MCG/ACT nasal spray Place 2 sprays into both nostrils daily as needed for rhinitis.    Yes [provider]  fluticasone furoate-vilanterol (BREO ELLIPTA) 100-25 MCG/INH AEPB Inhale 1 puff into the lungs daily. 03/27/16  Yes  [provider]  furosemide (LASIX) 40 MG tablet Take 1 tablet (40 mg total) by mouth every other day. Patient taking differently: Take 40 mg by mouth See admin instructions. Takes sun,tue,thur,sat 01/21/16  Yes Enedina FinnerPatel, Sona, MD  levothyroxine (SYNTHROID, LEVOTHROID) 50 MCG tablet Take 50 mcg by mouth daily before breakfast. Pt takes this dose on Sunday, Tuesday, Wednesday, Friday, and Saturday.   Yes [provider]  metolazone (ZAROXOLYN) 2.5 MG tablet Take 2.5 mg by mouth 2 (two) times a week. 2.5 mg every Monday & Thursday 11/01/16  Yes [provider]  montelukast (SINGULAIR) 10 MG tablet Take 10 mg by mouth at bedtime.   Yes [provider]  Multiple Vitamin (MULTIVITAMIN WITH MINERALS) TABS tablet Take 1 tablet by mouth daily.   Yes [provider]  potassium chloride SA (K-DUR,KLOR-CON) 20 MEQ tablet Take 1 tablet (20 mEq total) by mouth every other day. Patient taking differently: Take 10-20 mEq by mouth 2 (two) times daily. 10 meq every morning and 20 meq every evening 01/21/16  Yes Enedina FinnerPatel, Sona, MD  tamsulosin (FLOMAX) 0.4 MG CAPS capsule Take 0.4 mg by mouth daily.    Yes [provider]  warfarin (COUMADIN) 2 MG tablet Take 1-2 mg by mouth See admin instructions. 2 mg on mon,wed,fri,sun & 1 mg tues,thurs,sat 12/12/16  Yes [provider]  aspirin EC 81 MG tablet Take 81 mg by mouth daily.    [provider]  Multiple Vitamins-Minerals (CENTRUM SILVER PO) Take 1 tablet by mouth daily.    [provider]  oxyCODONE (OXY IR/ROXICODONE) 5 MG immediate release tablet Take 1-2 tablets (5-10 mg total) by mouth every 4 (four) hours as needed for moderate pain or severe pain. Patient not taking: Reported on 11/29/2017 01/21/17   Jerald Kiefhiu, Stephen K, MD  predniSONE (DELTASONE) 10 MG tablet 50 mg daily x 2 days, then 40 mg daily x 2 days, then 30 mg daily x 2 days, then 20 mg daily x 2 days, then 10 mg daily x 2 days. 05/12/19   Tommie Samsook,  Damere Brandenburg G, DO  predniSONE (DELTASONE) 5 MG tablet Take 5 mg by mouth daily with breakfast.    [provider]  senna (SENOKOT) 8.6 MG TABS tablet Take 1 tablet (8.6 mg total) by mouth 2 (two) times daily. 01/21/17   Jerald Kiefhiu, Stephen K, MD   Family History Family History  Problem Relation Age of Onset   Hypertension Son    Social History Social History   Tobacco Use   Smoking status: Never Smoker   Smokeless tobacco: Never Used  Substance Use Topics   Alcohol use: No    Alcohol/week: 0.0 standard drinks   Drug use: No   Allergies   Amlodipine, Hydralazine, Iohexol, Metoprolol, Olmesartan, Other, Ramipril, Risperidone, Sulfa antibiotics, Telmisartan, and Tiotropium   Review of Systems Review of Systems  Constitutional:  Negative.   Musculoskeletal:       Pain - R index finger (DIP)  All other systems reviewed and are negative.  Physical Exam Triage Vital Signs ED Triage Vitals  Enc Vitals Group     BP 05/12/19 1029 137/75     Pulse Rate 05/12/19 1029 86     Resp 05/12/19 1029 18     Temp 05/12/19 1029 97.6 F (36.4 C)     Temp Source 05/12/19 1029 Oral     SpO2 05/12/19 1029 100 %     Weight 05/12/19 1022 208 lb (94.3 kg)     Height 05/12/19 1022 5\' 3"  (1.6 m)     Head Circumference --      Peak Flow --      Pain Score 05/12/19 1021 5     Pain Loc --      Pain Edu? --      Excl. in GC? --    Updated Vital Signs BP 137/75 (BP Location: Right Arm)    Pulse 86    Temp 97.6 F (36.4 C) (Oral)    Resp 18    Ht 5\' 3"  (1.6 m)    Wt 94.3 kg    SpO2 100%    BMI 36.85 kg/m   Visual Acuity Right Eye Distance:   Left Eye Distance:   Bilateral Distance:    Right Eye Near:   Left Eye Near:    Bilateral Near:     Physical Exam Vitals signs and nursing note reviewed.  Constitutional:      General: She is not in acute distress.    Appearance: Normal appearance. She is obese.  HENT:     Head: Normocephalic and atraumatic.  Eyes:     General:        Right  eye: No discharge.        Left eye: No discharge.     Conjunctiva/sclera: Conjunctivae normal.  Cardiovascular:     Rate and Rhythm: Normal rate and regular rhythm.  Pulmonary:     Effort: Pulmonary effort is normal. No respiratory distress.  Musculoskeletal:     Comments: Right index finger - DIP swelling and erythema noted.  Neurological:     Mental Status: She is alert.  Psychiatric:        Mood and Affect: Mood normal.        Behavior: Behavior normal.    UC Treatments / Results  Labs (all labs ordered are listed, but only abnormal results are displayed) Labs Reviewed - No data to display  EKG   Radiology No results found.  Procedures Procedures (including critical care time)  Medications Ordered in UC Medications - No data to display  Initial Impression / Assessment and Plan / UC Course  I have reviewed the triage vital signs and the nursing notes.  Pertinent labs & imaging results that were available during my care of the patient were reviewed by me and considered in my medical decision making (see chart for details).    78 year old female presents with a osteoarthritis flare.  Treating with prednisone.  Supportive care.  Final Clinical Impressions(s) / UC Diagnoses   Final diagnoses:  Primary osteoarthritis involving multiple joints     Discharge Instructions     Prednisone burst, then back to 5 mg daily.  Take care  Dr. Adriana Simasook    ED Prescriptions    Medication Sig Dispense Auth. Provider   predniSONE (DELTASONE) 10 MG tablet 50 mg daily x 2 days,  then 40 mg daily x 2 days, then 30 mg daily x 2 days, then 20 mg daily x 2 days, then 10 mg daily x 2 days. 30 tablet Tommie Samsook, Sanii Kukla G, DO     Controlled Substance Prescriptions [place] Controlled Substance Registry consulted? Not Applicable   Tommie SamsCook, Leylany Nored G, DO 05/12/19 1155

## 2019-08-29 ENCOUNTER — Emergency Department: Payer: Medicare Other

## 2019-08-29 ENCOUNTER — Encounter: Payer: Self-pay | Admitting: Emergency Medicine

## 2019-08-29 ENCOUNTER — Other Ambulatory Visit: Payer: Self-pay

## 2019-08-29 ENCOUNTER — Inpatient Hospital Stay
Admission: EM | Admit: 2019-08-29 | Discharge: 2019-09-02 | DRG: 208 | Disposition: A | Discharge status: Other Acute Inpatient Hospital | Payer: Medicare Other | Attending: Internal Medicine | Admitting: Internal Medicine

## 2019-08-29 ENCOUNTER — Inpatient Hospital Stay
Admission: AD | Admit: 2019-08-29 | Payer: No Typology Code available for payment source | Source: Other Acute Inpatient Hospital | Admitting: Internal Medicine

## 2019-08-29 DIAGNOSIS — Z79899 Other long term (current) drug therapy: Secondary | ICD-10-CM

## 2019-08-29 DIAGNOSIS — Z978 Presence of other specified devices: Secondary | ICD-10-CM | POA: Diagnosis not present

## 2019-08-29 DIAGNOSIS — T402X1A Poisoning by other opioids, accidental (unintentional), initial encounter: Secondary | ICD-10-CM | POA: Insufficient documentation

## 2019-08-29 DIAGNOSIS — N179 Acute kidney failure, unspecified: Secondary | ICD-10-CM | POA: Diagnosis present

## 2019-08-29 DIAGNOSIS — J9622 Acute and chronic respiratory failure with hypercapnia: Secondary | ICD-10-CM | POA: Diagnosis present

## 2019-08-29 DIAGNOSIS — J1289 Other viral pneumonia: Secondary | ICD-10-CM | POA: Diagnosis not present

## 2019-08-29 DIAGNOSIS — J441 Chronic obstructive pulmonary disease with (acute) exacerbation: Secondary | ICD-10-CM | POA: Diagnosis not present

## 2019-08-29 DIAGNOSIS — J9621 Acute and chronic respiratory failure with hypoxia: Secondary | ICD-10-CM | POA: Diagnosis present

## 2019-08-29 DIAGNOSIS — U071 COVID-19: Secondary | ICD-10-CM | POA: Diagnosis not present

## 2019-08-29 DIAGNOSIS — I482 Chronic atrial fibrillation, unspecified: Secondary | ICD-10-CM | POA: Diagnosis present

## 2019-08-29 DIAGNOSIS — F331 Major depressive disorder, recurrent, moderate: Secondary | ICD-10-CM | POA: Diagnosis not present

## 2019-08-29 DIAGNOSIS — N189 Chronic kidney disease, unspecified: Secondary | ICD-10-CM | POA: Diagnosis present

## 2019-08-29 DIAGNOSIS — E878 Other disorders of electrolyte and fluid balance, not elsewhere classified: Secondary | ICD-10-CM | POA: Diagnosis present

## 2019-08-29 DIAGNOSIS — J9601 Acute respiratory failure with hypoxia: Secondary | ICD-10-CM | POA: Diagnosis not present

## 2019-08-29 DIAGNOSIS — Z6835 Body mass index (BMI) 35.0-35.9, adult: Secondary | ICD-10-CM

## 2019-08-29 DIAGNOSIS — Z7901 Long term (current) use of anticoagulants: Secondary | ICD-10-CM

## 2019-08-29 DIAGNOSIS — Z4659 Encounter for fitting and adjustment of other gastrointestinal appliance and device: Secondary | ICD-10-CM

## 2019-08-29 DIAGNOSIS — Z7952 Long term (current) use of systemic steroids: Secondary | ICD-10-CM

## 2019-08-29 DIAGNOSIS — Z888 Allergy status to other drugs, medicaments and biological substances status: Secondary | ICD-10-CM

## 2019-08-29 DIAGNOSIS — J439 Emphysema, unspecified: Secondary | ICD-10-CM | POA: Diagnosis present

## 2019-08-29 DIAGNOSIS — Z882 Allergy status to sulfonamides status: Secondary | ICD-10-CM | POA: Diagnosis not present

## 2019-08-29 DIAGNOSIS — Z7982 Long term (current) use of aspirin: Secondary | ICD-10-CM

## 2019-08-29 DIAGNOSIS — N390 Urinary tract infection, site not specified: Secondary | ICD-10-CM | POA: Diagnosis present

## 2019-08-29 DIAGNOSIS — J449 Chronic obstructive pulmonary disease, unspecified: Secondary | ICD-10-CM | POA: Diagnosis not present

## 2019-08-29 DIAGNOSIS — I5033 Acute on chronic diastolic (congestive) heart failure: Secondary | ICD-10-CM | POA: Diagnosis not present

## 2019-08-29 DIAGNOSIS — Z8249 Family history of ischemic heart disease and other diseases of the circulatory system: Secondary | ICD-10-CM

## 2019-08-29 DIAGNOSIS — R34 Anuria and oliguria: Secondary | ICD-10-CM | POA: Diagnosis not present

## 2019-08-29 DIAGNOSIS — J80 Acute respiratory distress syndrome: Secondary | ICD-10-CM | POA: Diagnosis not present

## 2019-08-29 DIAGNOSIS — R059 Cough, unspecified: Secondary | ICD-10-CM

## 2019-08-29 DIAGNOSIS — I959 Hypotension, unspecified: Secondary | ICD-10-CM | POA: Diagnosis present

## 2019-08-29 DIAGNOSIS — G9340 Encephalopathy, unspecified: Secondary | ICD-10-CM | POA: Diagnosis present

## 2019-08-29 DIAGNOSIS — E871 Hypo-osmolality and hyponatremia: Secondary | ICD-10-CM | POA: Diagnosis present

## 2019-08-29 DIAGNOSIS — I4891 Unspecified atrial fibrillation: Secondary | ICD-10-CM | POA: Diagnosis not present

## 2019-08-29 DIAGNOSIS — R05 Cough: Secondary | ICD-10-CM

## 2019-08-29 DIAGNOSIS — R791 Abnormal coagulation profile: Secondary | ICD-10-CM | POA: Diagnosis not present

## 2019-08-29 DIAGNOSIS — E860 Dehydration: Secondary | ICD-10-CM | POA: Diagnosis present

## 2019-08-29 DIAGNOSIS — Z96643 Presence of artificial hip joint, bilateral: Secondary | ICD-10-CM | POA: Diagnosis present

## 2019-08-29 DIAGNOSIS — Z7189 Other specified counseling: Secondary | ICD-10-CM | POA: Diagnosis not present

## 2019-08-29 DIAGNOSIS — I5032 Chronic diastolic (congestive) heart failure: Secondary | ICD-10-CM | POA: Diagnosis not present

## 2019-08-29 DIAGNOSIS — Z79891 Long term (current) use of opiate analgesic: Secondary | ICD-10-CM

## 2019-08-29 DIAGNOSIS — R531 Weakness: Secondary | ICD-10-CM

## 2019-08-29 DIAGNOSIS — E669 Obesity, unspecified: Secondary | ICD-10-CM | POA: Diagnosis present

## 2019-08-29 DIAGNOSIS — E039 Hypothyroidism, unspecified: Secondary | ICD-10-CM | POA: Diagnosis present

## 2019-08-29 DIAGNOSIS — Z9911 Dependence on respirator [ventilator] status: Secondary | ICD-10-CM | POA: Diagnosis not present

## 2019-08-29 DIAGNOSIS — I509 Heart failure, unspecified: Secondary | ICD-10-CM | POA: Insufficient documentation

## 2019-08-29 DIAGNOSIS — E876 Hypokalemia: Secondary | ICD-10-CM | POA: Diagnosis present

## 2019-08-29 DIAGNOSIS — I10 Essential (primary) hypertension: Secondary | ICD-10-CM | POA: Diagnosis present

## 2019-08-29 DIAGNOSIS — Z90710 Acquired absence of both cervix and uterus: Secondary | ICD-10-CM | POA: Diagnosis not present

## 2019-08-29 DIAGNOSIS — Z79890 Hormone replacement therapy (postmenopausal): Secondary | ICD-10-CM

## 2019-08-29 DIAGNOSIS — I130 Hypertensive heart and chronic kidney disease with heart failure and stage 1 through stage 4 chronic kidney disease, or unspecified chronic kidney disease: Secondary | ICD-10-CM | POA: Diagnosis present

## 2019-08-29 DIAGNOSIS — J9620 Acute and chronic respiratory failure, unspecified whether with hypoxia or hypercapnia: Secondary | ICD-10-CM | POA: Diagnosis present

## 2019-08-29 LAB — FERRITIN: Ferritin: 296 ng/mL (ref 11–307)

## 2019-08-29 LAB — TROPONIN I (HIGH SENSITIVITY)
Troponin I (High Sensitivity): 18 ng/L — ABNORMAL HIGH (ref <18)
Troponin I (High Sensitivity): 17 ng/L (ref <18)

## 2019-08-29 LAB — HEMOGLOBIN A1C
Hgb A1c MFr Bld: 5.9 % — ABNORMAL HIGH (ref 4.8–5.6)
Mean Plasma Glucose: 122.63 mg/dL

## 2019-08-29 LAB — CBC WITH DIFFERENTIAL/PLATELET
Abs Immature Granulocytes: 0.08 10*3/uL — ABNORMAL HIGH (ref 0.00–0.07)
Basophils Absolute: 0 10*3/uL (ref 0.0–0.1)
Basophils Relative: 0 %
Eosinophils Absolute: 0 10*3/uL (ref 0.0–0.5)
Eosinophils Relative: 0 %
HCT: 35 % — ABNORMAL LOW (ref 36.0–46.0)
Hemoglobin: 11.8 g/dL — ABNORMAL LOW (ref 12.0–15.0)
Immature Granulocytes: 1 %
Lymphocytes Relative: 11 %
Lymphs Abs: 0.8 10*3/uL (ref 0.7–4.0)
MCH: 33.1 pg (ref 26.0–34.0)
MCHC: 33.7 g/dL (ref 30.0–36.0)
MCV: 98 fL (ref 80.0–100.0)
Monocytes Absolute: 0.3 10*3/uL (ref 0.1–1.0)
Monocytes Relative: 4 %
Neutro Abs: 6.2 10*3/uL (ref 1.7–7.7)
Neutrophils Relative %: 84 %
Platelets: 124 10*3/uL — ABNORMAL LOW (ref 150–400)
RBC: 3.57 MIL/uL — ABNORMAL LOW (ref 3.87–5.11)
RDW: 13.7 % (ref 11.5–15.5)
WBC: 7.5 10*3/uL (ref 4.0–10.5)
nRBC: 0 % (ref 0.0–0.2)

## 2019-08-29 LAB — COMPREHENSIVE METABOLIC PANEL
ALT: 21 U/L (ref 0–44)
AST: 40 U/L (ref 15–41)
Albumin: 3 g/dL — ABNORMAL LOW (ref 3.5–5.0)
Alkaline Phosphatase: 58 U/L (ref 38–126)
Anion gap: 12 (ref 5–15)
BUN: 25 mg/dL — ABNORMAL HIGH (ref 8–23)
CO2: 26 mmol/L (ref 22–32)
Calcium: 8.1 mg/dL — ABNORMAL LOW (ref 8.9–10.3)
Chloride: 90 mmol/L — ABNORMAL LOW (ref 98–111)
Creatinine, Ser: 1.31 mg/dL — ABNORMAL HIGH (ref 0.44–1.00)
GFR calc Af Amer: 45 mL/min — ABNORMAL LOW (ref >60)
GFR calc non Af Amer: 39 mL/min — ABNORMAL LOW (ref >60)
Glucose, Bld: 83 mg/dL (ref 70–99)
Potassium: 3.5 mmol/L (ref 3.5–5.1)
Sodium: 128 mmol/L — ABNORMAL LOW (ref 135–145)
Total Bilirubin: 1.5 mg/dL — ABNORMAL HIGH (ref 0.3–1.2)
Total Protein: 6.1 g/dL — ABNORMAL LOW (ref 6.5–8.1)

## 2019-08-29 LAB — FIBRIN DERIVATIVES D-DIMER (ARMC ONLY): Fibrin derivatives D-dimer (ARMC): 1043.88 ng/mL (FEU) — ABNORMAL HIGH (ref 0.00–499.00)

## 2019-08-29 LAB — URINALYSIS, COMPLETE (UACMP) WITH MICROSCOPIC
Bilirubin Urine: NEGATIVE
Glucose, UA: NEGATIVE mg/dL
Hgb urine dipstick: NEGATIVE
Ketones, ur: NEGATIVE mg/dL
Nitrite: NEGATIVE
Protein, ur: 30 mg/dL — AB
Specific Gravity, Urine: 1.013 (ref 1.005–1.030)
WBC, UA: >50 WBC/hpf — ABNORMAL HIGH (ref 0–5)
pH: 7 (ref 5.0–8.0)

## 2019-08-29 LAB — PROTIME-INR
INR: 7.1 (ref 0.8–1.2)
Prothrombin Time: 61.3 seconds — ABNORMAL HIGH (ref 11.4–15.2)

## 2019-08-29 LAB — POC SARS CORONAVIRUS 2 AG: SARS Coronavirus 2 Ag: NEGATIVE

## 2019-08-29 LAB — LACTATE DEHYDROGENASE: LDH: 299 U/L — ABNORMAL HIGH (ref 98–192)

## 2019-08-29 LAB — SARS CORONAVIRUS 2 BY RT PCR (HOSPITAL ORDER, PERFORMED IN ~~LOC~~ HOSPITAL LAB): SARS Coronavirus 2: POSITIVE — AB

## 2019-08-29 LAB — LACTIC ACID, PLASMA: Lactic Acid, Venous: 1.7 mmol/L (ref 0.5–1.9)

## 2019-08-29 LAB — BRAIN NATRIURETIC PEPTIDE: B Natriuretic Peptide: 223 pg/mL — ABNORMAL HIGH (ref 0.0–100.0)

## 2019-08-29 LAB — FIBRINOGEN: Fibrinogen: 574 mg/dL — ABNORMAL HIGH (ref 210–475)

## 2019-08-29 LAB — TSH: TSH: 0.391 u[IU]/mL (ref 0.350–4.500)

## 2019-08-29 LAB — C-REACTIVE PROTEIN: CRP: 9.7 mg/dL — ABNORMAL HIGH (ref <1.0)

## 2019-08-29 LAB — TRIGLYCERIDES: Triglycerides: 76 mg/dL (ref <150)

## 2019-08-29 LAB — PROCALCITONIN: Procalcitonin: <0.1 ng/mL

## 2019-08-29 MED ORDER — FLUTICASONE FUROATE-VILANTEROL 100-25 MCG/INH IN AEPB
1.0000 | INHALATION_SPRAY | Freq: Every day | RESPIRATORY_TRACT | Status: DC
  Administered 2019-08-29 – 2019-09-02 (×5): 1 via RESPIRATORY_TRACT
  Filled 2019-08-29: qty 28

## 2019-08-29 MED ORDER — ASPIRIN EC 81 MG PO TBEC
81.0000 mg | DELAYED_RELEASE_TABLET | Freq: Every day | ORAL | Status: DC
  Administered 2019-08-29 – 2019-09-02 (×5): 81 mg via ORAL
  Filled 2019-08-29 (×5): qty 1

## 2019-08-29 MED ORDER — SODIUM CHLORIDE 0.9 % IV SOLN
1.0000 g | INTRAVENOUS | Status: DC
  Administered 2019-08-30 – 2019-08-31 (×2): 1 g via INTRAVENOUS
  Filled 2019-08-29 (×2): qty 1

## 2019-08-29 MED ORDER — TAMSULOSIN HCL 0.4 MG PO CAPS
0.4000 mg | ORAL_CAPSULE | Freq: Every day | ORAL | Status: DC
  Administered 2019-08-30 – 2019-09-02 (×4): 0.4 mg via ORAL
  Filled 2019-08-29 (×4): qty 1

## 2019-08-29 MED ORDER — ONDANSETRON HCL 4 MG/2ML IJ SOLN
4.0000 mg | Freq: Once | INTRAMUSCULAR | Status: AC
  Administered 2019-08-29: 4 mg via INTRAVENOUS
  Filled 2019-08-29: qty 2

## 2019-08-29 MED ORDER — IPRATROPIUM-ALBUTEROL 20-100 MCG/ACT IN AERS
1.0000 | INHALATION_SPRAY | Freq: Four times a day (QID) | RESPIRATORY_TRACT | Status: DC | PRN
  Administered 2019-08-31 – 2019-09-01 (×4): 1 via RESPIRATORY_TRACT
  Filled 2019-08-29: qty 4

## 2019-08-29 MED ORDER — FUROSEMIDE 20 MG PO TABS
20.0000 mg | ORAL_TABLET | ORAL | Status: DC
  Administered 2019-08-29: 20 mg via ORAL
  Filled 2019-08-29 (×2): qty 1

## 2019-08-29 MED ORDER — LEVOTHYROXINE SODIUM 50 MCG PO TABS
50.0000 ug | ORAL_TABLET | ORAL | Status: DC
  Administered 2019-08-30 – 2019-09-02 (×3): 50 ug via ORAL
  Filled 2019-08-29 (×3): qty 1

## 2019-08-29 MED ORDER — ESCITALOPRAM OXALATE 10 MG PO TABS
20.0000 mg | ORAL_TABLET | Freq: Every day | ORAL | Status: DC
  Administered 2019-08-29 – 2019-09-02 (×5): 20 mg via ORAL
  Filled 2019-08-29 (×5): qty 2

## 2019-08-29 MED ORDER — ACETAMINOPHEN 325 MG PO TABS
650.0000 mg | ORAL_TABLET | Freq: Four times a day (QID) | ORAL | Status: DC | PRN
  Administered 2019-08-29 – 2019-08-30 (×2): 650 mg via ORAL
  Filled 2019-08-29 (×2): qty 2

## 2019-08-29 MED ORDER — DILTIAZEM HCL ER 120 MG PO CP24
120.0000 mg | ORAL_CAPSULE | Freq: Every day | ORAL | Status: DC
  Administered 2019-08-29: 120 mg via ORAL
  Filled 2019-08-29 (×4): qty 1

## 2019-08-29 MED ORDER — BENZONATATE 100 MG PO CAPS
200.0000 mg | ORAL_CAPSULE | Freq: Three times a day (TID) | ORAL | Status: DC | PRN
  Administered 2019-08-29 – 2019-08-31 (×3): 200 mg via ORAL
  Filled 2019-08-29 (×3): qty 2

## 2019-08-29 MED ORDER — MONTELUKAST SODIUM 10 MG PO TABS
10.0000 mg | ORAL_TABLET | Freq: Every day | ORAL | Status: DC
  Administered 2019-08-29 – 2019-09-01 (×4): 10 mg via ORAL
  Filled 2019-08-29 (×4): qty 1

## 2019-08-29 MED ORDER — SODIUM CHLORIDE 0.9 % IV SOLN
200.0000 mg | Freq: Once | INTRAVENOUS | Status: AC
  Administered 2019-08-29: 200 mg via INTRAVENOUS
  Filled 2019-08-29: qty 40

## 2019-08-29 MED ORDER — SODIUM CHLORIDE 0.9 % IV SOLN
100.0000 mg | Freq: Every day | INTRAVENOUS | Status: AC
  Administered 2019-08-30 – 2019-09-02 (×4): 100 mg via INTRAVENOUS
  Filled 2019-08-29 (×4): qty 20

## 2019-08-29 MED ORDER — ATENOLOL 25 MG PO TABS
25.0000 mg | ORAL_TABLET | Freq: Every day | ORAL | Status: DC
  Filled 2019-08-29: qty 1

## 2019-08-29 MED ORDER — SODIUM CHLORIDE 0.9 % IV SOLN
1.0000 g | Freq: Every day | INTRAVENOUS | Status: DC
  Filled 2019-08-29: qty 10

## 2019-08-29 MED ORDER — SODIUM CHLORIDE 0.9% FLUSH
3.0000 mL | Freq: Two times a day (BID) | INTRAVENOUS | Status: DC
  Administered 2019-08-29 – 2019-09-02 (×7): 3 mL via INTRAVENOUS

## 2019-08-29 MED ORDER — SPIRONOLACTONE 25 MG PO TABS: 25.0000 mg | ORAL_TABLET | ORAL | Status: DC

## 2019-08-29 MED ORDER — SODIUM CHLORIDE 0.9 % IV BOLUS
1000.0000 mL | Freq: Once | INTRAVENOUS | Status: AC
  Administered 2019-08-29: 06:00:00 1000 mL via INTRAVENOUS

## 2019-08-29 MED ORDER — SODIUM CHLORIDE 0.9% FLUSH
3.0000 mL | INTRAVENOUS | Status: DC | PRN
  Administered 2019-08-30: 3 mL via INTRAVENOUS
  Filled 2019-08-29: qty 3

## 2019-08-29 MED ORDER — LEVOTHYROXINE SODIUM 50 MCG PO TABS
75.0000 ug | ORAL_TABLET | ORAL | Status: DC
  Administered 2019-08-31: 75 ug via ORAL
  Filled 2019-08-29: qty 2

## 2019-08-29 MED ORDER — BISACODYL 5 MG PO TBEC
5.0000 mg | DELAYED_RELEASE_TABLET | Freq: Every day | ORAL | Status: DC | PRN
  Administered 2019-08-31: 5 mg via ORAL
  Filled 2019-08-29: qty 1

## 2019-08-29 MED ORDER — ATORVASTATIN CALCIUM 20 MG PO TABS
20.0000 mg | ORAL_TABLET | Freq: Every day | ORAL | Status: DC
  Administered 2019-08-29 – 2019-09-01 (×4): 20 mg via ORAL
  Filled 2019-08-29 (×4): qty 1

## 2019-08-29 MED ORDER — PANTOPRAZOLE SODIUM 40 MG PO TBEC
40.0000 mg | DELAYED_RELEASE_TABLET | Freq: Every day | ORAL | Status: DC
  Administered 2019-08-29 – 2019-09-02 (×5): 40 mg via ORAL
  Filled 2019-08-29 (×5): qty 1

## 2019-08-29 MED ORDER — ESOMEPRAZOLE MAGNESIUM 20 MG PO TBEC: 20.0000 mg | DELAYED_RELEASE_TABLET | ORAL | Status: DC

## 2019-08-29 MED ORDER — FERROUS SULFATE 325 (65 FE) MG PO TABS
325.0000 mg | ORAL_TABLET | ORAL | Status: DC
  Administered 2019-08-29 – 2019-09-01 (×4): 325 mg via ORAL
  Filled 2019-08-29 (×4): qty 1

## 2019-08-29 MED ORDER — MORPHINE SULFATE (PF) 2 MG/ML IV SOLN
1.0000 mg | INTRAVENOUS | Status: DC | PRN
  Filled 2019-08-29: qty 1

## 2019-08-29 MED ORDER — SODIUM CHLORIDE 0.9 % IV SOLN
1.0000 g | Freq: Once | INTRAVENOUS | Status: AC
  Administered 2019-08-29: 1 g via INTRAVENOUS
  Filled 2019-08-29: qty 10

## 2019-08-29 MED ORDER — ALPRAZOLAM 0.25 MG PO TABS
0.2500 mg | ORAL_TABLET | Freq: Three times a day (TID) | ORAL | Status: DC | PRN
  Administered 2019-08-31 – 2019-09-02 (×3): 0.25 mg via ORAL
  Filled 2019-08-29 (×3): qty 1

## 2019-08-29 MED ORDER — ONDANSETRON HCL 4 MG/2ML IJ SOLN
INTRAMUSCULAR | Status: AC
  Filled 2019-08-29: qty 2

## 2019-08-29 NOTE — Progress Notes (Signed)
Remdesivir - Pharmacy Brief Note   O:  ALT: 21 CXR: Heterogeneous bilateral lung opacities, pattern consistent with COVID-19 pneumonia SpO2: 95% on 2L   A/P:  Remdesivir 200 mg IVPB once followed by 100 mg IVPB daily x 4 days.   .me 08/29/2019 9:01 AM

## 2019-08-29 NOTE — ED Provider Notes (Signed)
Patient PCR for COVID-19 positive.  Felt to be far more sensitive than antigen testing, patient also reports she tested positive and has been having symptoms.  She had a previous test at [facility] department as well that was positive  She is awake and alert, currently on supplemental oxygen, oxygen saturation mid 95 on 2 L.  She has had some mild oxygen requirement, plan to be hospitalized, signs of pulmonary involvement on imaging.  We will initiate remdesivir, discussed with the patient.  I requested transfer to [facility], patient understanding agreeable with plan at this time   Delman Kitten, MD 08/29/19 [phone]

## 2019-08-29 NOTE — H&P (Addendum)
History and Physical    Maureen Mullatricia King Banka ZOX:[id number] DOB: 1941/05/02 DOA: 08/29/2019  PCP: Joanna Hewsate, Allen D, MD   Patient coming from: Home  Chief Complaint: Generalized weakness  HPI: Maureen Wilson is a 78 y.o. female with medical history significant of high blood pressure, chronic A. fib, COPD seen in the emergency room today for generalized weakness not feeling well since past Saturday.  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  Patient came to the emergency room via EMS for this weakness and nausea and vomiting.  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  Patient lives home with spouse and she is usually able to cook their meals and takes care of him and herself. Pt was diagnosed with Covid-19 Infection on Wednesday and has been progressively weak and fatigued.  ED Course:  Blood pressure 111/62, pulse 92, temperature 100 F (37.8 C), temperature source Core, resp. rate (!) 21, height 5\' 3"  (1.6 m), weight 92.1 kg, SpO2 97 %. Patient in the ED is alert awake oriented denies any complete pulse oximetry is 97% on 2 L and patient is comfortable on this.  Patient found to have Covid positive rapid in the emergency room as well patient started on remdesivir, Zofran for her nausea.  Results show hyponatremia and hypochloremia reflecting her dehydration.  Patient's creatinine is 1.31 which is her baseline from her previous admissions, serum albumin level is mildly low.  AST ALT are within normal limits. Pt had chest xray which shows bl opacities consistent with Covid-19 pneumonia infection.  Review of Systems: As per HPI otherwise 10 point review of systems negative.    Past Medical History:  Diagnosis Date  . Asthma   . Atrial fibrillation (HCC)   . Back pain   . Bronchitis    hx of  . CHF (congestive heart failure) (HCC)   . Chronic kidney disease   . Chronic respiratory failure (HCC)   . COPD  (chronic obstructive pulmonary disease) (HCC)   . Emphysema lung (HCC)   . Hypertension     Past Surgical History:  Procedure Laterality Date  . ABDOMINAL HYSTERECTOMY    . PARTIAL HIP ARTHROPLASTY Right   . TOTAL HIP ARTHROPLASTY Left   . TOTAL HIP REVISION Left 01/17/2017   Procedure: LEFT TOTAL HIP REVISION;  Surgeon: Samson FredericBrian Swinteck, MD;  Location: [facility];  Service: Orthopedics;  Laterality: Left;     reports that she has never smoked. She has never used smokeless tobacco. She reports that she does not drink alcohol or use drugs.  Allergies  Allergen Reactions  . Amlodipine Swelling and Other (See Comments)    Reaction:  Lip/mouth swelling   . Hydralazine Swelling and Other (See Comments)    Reaction:  Lip/mouth swelling   . Iohexol Hives and Other (See Comments)    Desc: Pt developed hives after receiving iv dye for ct scan.  suggest she be premedicated for future exams, Onset Date: [id number]   . Metoprolol Swelling and Other (See Comments)    Reaction:  Lip/mouth swelling   . Olmesartan Swelling and Other (See Comments)    Reaction:  Lip/mouth swelling   . Other Swelling    Lip/mouth swelling  . Ramipril Swelling and Other (See Comments)    Reaction:  Lip/mouth swelling   . Risperidone Other (See Comments)  . Sulfa Antibiotics Swelling and Other (See Comments)    Reaction:  Lip/mouth swelling  angioedema  . Telmisartan Swelling and Other (See Comments)    Reaction:  Lip/mouth swelling   . Tiotropium Swelling and Other (See Comments)    Reaction:  Lip/mouth swelling     Family History  Problem Relation Age of Onset  . Hypertension Son     Prior to Admission medications   Medication Sig Start Date End Date Taking? Authorizing Provider  albuterol (PROVENTIL) (2.5 MG/3ML) 0.083% nebulizer solution Inhale 3 mLs into the lungs every 4 (four) hours as needed for wheezing or shortness of breath.     [provider]  albuterol-ipratropium (COMBIVENT) 18-103  MCG/ACT inhaler Inhale 1-2 puffs into the lungs every 4 (four) hours as needed for wheezing or shortness of breath.     [provider]  ALPRAZolam Prudy Feeler) 0.25 MG tablet Take 0.25 mg by mouth daily as needed. ANXIETY 11/07/17   [provider]  aspirin EC 81 MG tablet Take 81 mg by mouth daily.    [provider]  atenolol (TENORMIN) 25 MG tablet Take 25 mg by mouth daily.     [provider]  atorvastatin (LIPITOR) 20 MG tablet Take 20 mg by mouth at bedtime.     [provider]  cholecalciferol (VITAMIN D) 1000 units tablet Take 2,000 Units by mouth daily.    [provider]  clonazePAM (KLONOPIN) 1 MG tablet Take 0.5-1 mg by mouth 2 (two) times daily. Pt takes one-half tablet in the morning, and one tablet at bedtime.    [provider]  diltiazem (DILACOR XR) 180 MG 24 hr capsule Take 180 mg by mouth daily.    [provider]  docusate sodium (COLACE) 100 MG capsule Take 1 capsule (100 mg total) by mouth 2 (two) times daily. 01/21/17   Jerald Kief, MD  escitalopram (LEXAPRO) 20 MG tablet Take 20 mg by mouth daily.    [provider]  esomeprazole (NEXIUM) 20 MG capsule Take 20 mg by mouth 2 (two) times daily before a meal.    [provider]  fluticasone (FLONASE) 50 MCG/ACT nasal spray Place 2 sprays into both nostrils daily as needed for rhinitis.     [provider]  fluticasone furoate-vilanterol (BREO ELLIPTA) 100-25 MCG/INH AEPB Inhale 1 puff into the lungs daily. 03/27/16   [provider]  furosemide (LASIX) 40 MG tablet Take 1 tablet (40 mg total) by mouth every other day. Patient taking differently: Take 40 mg by mouth See admin instructions. Takes sun,tue,thur,sat 01/21/16   Enedina Finner, MD  levothyroxine (SYNTHROID, LEVOTHROID) 50 MCG tablet Take 50 mcg by mouth daily before breakfast. Pt takes this dose on Sunday, Tuesday, Wednesday, Friday, and Saturday.    [provider]  metolazone (ZAROXOLYN) 2.5 MG tablet Take 2.5 mg by mouth 2 (two) times a week. 2.5 mg every Monday & Thursday 11/01/16   [provider]  montelukast (SINGULAIR) 10 MG tablet Take 10 mg by mouth at bedtime.    [provider]  Multiple Vitamin (MULTIVITAMIN WITH MINERALS) TABS tablet Take 1 tablet by mouth daily.    [provider]  Multiple Vitamins-Minerals (CENTRUM SILVER PO) Take 1 tablet by mouth daily.    [provider]  oxyCODONE (OXY IR/ROXICODONE) 5 MG immediate release tablet Take 1-2 tablets (5-10 mg total) by mouth every 4 (four) hours as needed for moderate pain or severe pain. Patient not taking: Reported on 11/29/2017 01/21/17   Jerald Kief, MD  potassium chloride SA (  K-DUR,KLOR-CON) 20 MEQ tablet Take 1 tablet (20 mEq total) by mouth every other day. Patient taking differently: Take 10-20 mEq by mouth 2 (two) times daily. 10 meq every morning and 20 meq every evening 01/21/16   Fritzi Mandes, MD  predniSONE (DELTASONE) 10 MG tablet 50 mg daily x 2 days, then 40 mg daily x 2 days, then 30 mg daily x 2 days, then 20 mg daily x 2 days, then 10 mg daily x 2 days. 05/12/19   Coral Spikes, DO  predniSONE (DELTASONE) 5 MG tablet Take 5 mg by mouth daily with breakfast.    [provider]  senna (SENOKOT) 8.6 MG TABS tablet Take 1 tablet (8.6 mg total) by mouth 2 (two) times daily. 01/21/17   Donne Hazel, MD  tamsulosin (FLOMAX) 0.4 MG CAPS capsule Take 0.4 mg by mouth daily.     [provider]  warfarin (COUMADIN) 2 MG tablet Take 1-2 mg by mouth See admin instructions. 2 mg on mon,wed,fri,sun & 1 mg tues,thurs,sat 12/12/16   [provider]    Physical Exam: Vitals:   08/29/19 1244 08/29/19 1300 08/29/19 1414 08/29/19 1419  BP: 111/62 (!) 114/49 114/60   Pulse: 92 (!) 101 94 (!) 101  Resp: (!) 21 (!) 29 (!) 21   Temp:   98.9 F (37.2 C)   TempSrc:   Oral   SpO2: 97% 92% (!) 89% 91%  Weight:       Height:        Constitutional: NAD, calm, comfortable Vitals:   08/29/19 1244 08/29/19 1300 08/29/19 1414 08/29/19 1419  BP: 111/62 (!) 114/49 114/60   Pulse: 92 (!) 101 94 (!) 101  Resp: (!) 21 (!) 29 (!) 21   Temp:   98.9 F (37.2 C)   TempSrc:   Oral   SpO2: 97% 92% (!) 89% 91%  Weight:      Height:       Eyes: PERRL,bruise around right periorbital area  lids and conjunctivae normal ENMT: Mucous membranes are moist. Posterior pharynx clear of any exudate or lesions.Normal dentition.  Neck: normal, supple, no masses, no thyromegaly Respiratory: Scattered rhonchi bilaterally in both bases, no wheezing, no crackles. Normal respiratory effort. No accessory muscle use.  Cardiovascular: Irregular rate and rhythm, no murmurs / rubs / gallops. No extremity edema. 2+ pedal pulses. No carotid bruits.  Abdomen: no tenderness, no masses palpated. No hepatosplenomegaly. Bowel sounds positive.  Musculoskeletal: no clubbing / cyanosis. No joint deformity upper and lower extremities.  no contractures. Normal muscle tone.  Skin: no rashes, lesions, ulcers. No induration Neurologic: CN 2-12 grossly intact. Sensation intact, DTR normal. Strength 5/5 in all 4.  Psychiatric: Normal judgment and insight. Alert and oriented x 3. Normal mood.    Labs on Admission: I have personally reviewed following labs and imaging studies  CBC: Recent Labs  Lab 08/29/19 0506  WBC 7.5  NEUTROABS 6.2  HGB 11.8*  HCT 35.0*  MCV 98.0  PLT 431*   Basic Metabolic Panel: Recent Labs  Lab 08/29/19 0506  NA 128*  K 3.5  CL 90*  CO2 26  GLUCOSE 83  BUN 25*  CREATININE 1.31*  CALCIUM 8.1*   GFR: Estimated Creatinine Clearance: 38.2 mL/min (A) (by C-G formula based on SCr of 1.31 mg/dL (H)). Liver Function Tests: Recent Labs  Lab 08/29/19 0506  AST 40  ALT 21  ALKPHOS 58  BILITOT 1.5*  PROT 6.1*  ALBUMIN 3.0*   No results  for input(s): LIPASE, AMYLASE in the last 168 hours. No results for  input(s): AMMONIA in the last 168 hours. Coagulation Profile: Recent Labs  Lab 08/29/19 0506  INR 7.1*   Cardiac Enzymes: No results for input(s): CKTOTAL, CKMB, CKMBINDEX, TROPONINI in the last 168 hours. BNP (last 3 results) No results for input(s): PROBNP in the last 8760 hours. HbA1C: No results for input(s): HGBA1C in the last 72 hours. CBG: No results for input(s): GLUCAP in the last 168 hours. Lipid Profile: Recent Labs    08/29/19 0506  TRIG 76   Thyroid Function Tests: Recent Labs    08/29/19 0506  TSH 0.391   Anemia Panel: Recent Labs    08/29/19 0506  FERRITIN 296   Urine analysis:    Component Value Date/Time   COLORURINE YELLOW (A) 08/29/2019 0506   APPEARANCEUR CLOUDY (A) 08/29/2019 0506   APPEARANCEUR CLEAR 01/11/2015 0950   LABSPEC 1.013 08/29/2019 0506   LABSPEC 1.005 01/11/2015 0950   PHURINE 7.0 08/29/2019 0506   GLUCOSEU NEGATIVE 08/29/2019 0506   GLUCOSEU NEGATIVE 01/11/2015 0950   HGBUR NEGATIVE 08/29/2019 0506   BILIRUBINUR NEGATIVE 08/29/2019 0506   BILIRUBINUR NEGATIVE 01/11/2015 0950   KETONESUR NEGATIVE 08/29/2019 0506   PROTEINUR 30 (A) 08/29/2019 0506   UROBILINOGEN 1.0 11/21/2010 1115   NITRITE NEGATIVE 08/29/2019 0506   LEUKOCYTESUR LARGE (A) 08/29/2019 0506   LEUKOCYTESUR NEGATIVE 01/11/2015 0950    Radiological Exams on Admission: Dg Pelvis Portable  Result Date: 08/29/2019 CLINICAL DATA:  Bilateral hip pain. EXAM: PORTABLE PELVIS 1-2 VIEWS COMPARISON:  Pelvic radiograph 01/16/2017 FINDINGS: Unipolar right hip arthroplasty place. Hardware is intact were visualized. Distal aspect of the femoral stem is not included in the field of view. Left hip arthroplasty in expected alignment. Cerclage wires about the proximal femoral component. Distal femoral stem not included in the field of view. Mild heterotopic ossification medially. Pelvic ring is intact. Pubic symphysis and sacroiliac joints are congruent. Tacks from prior hernia  repair in the right inguinal region. Multiple pelvic phleboliths. There are vascular calcifications. IMPRESSION: 1. Intact bilateral hip arthroplasties. Distal aspect of both femoral stems not included in the field of view. 2. No periprosthetic or acute abnormality. Electronically Signed   By: Narda Rutherford M.D.   On: 08/29/2019 06:10   Dg Chest Port 1 View  Result Date: 08/29/2019 CLINICAL DATA:  COVID-19. Cough. EXAM: PORTABLE CHEST 1 VIEW COMPARISON:  Radiograph 11/29/2017 FINDINGS: Unchanged cardiomegaly and mediastinal contours. Aortic atherosclerosis. Bilateral heterogeneous lung opacities. Mild peribronchial thickening. No pleural fluid or pneumothorax. Chronic changes of both shoulders. IMPRESSION: 1. Heterogeneous bilateral lung opacities, pattern consistent with COVID-19 pneumonia. 2. Chronic cardiomegaly, unchanged since February 2019. Aortic Atherosclerosis (ICD10-I70.0). Electronically Signed   By: Narda Rutherford M.D.   On: 08/29/2019 06:08   EKG: Independently reviewed.  A. fib with a rate of 95, PVCs.   2 D  Echo 01/2017:Left ventricle: The cavity size was normal. There was mild   concentric hypertrophy. Systolic function was normal. The   estimated ejection fraction was in the range of 50% to 55%. Wall   motion was normal; there were no regional wall motion   abnormalities. Features are consistent with a pseudonormal left   ventricular filling pattern, with concomitant abnormal relaxation   and increased filling pressure (grade 2 diastolic dysfunction). - Aortic valve: There was mild regurgitation. - Mitral valve: Moderately calcified annulus. Mildly thickened   leaflets . There was mild to moderate regurgitation. - Left atrium: The atrium was  severely dilated. - Tricuspid valve: There was moderate regurgitation. - Pulmonary arteries: Systolic pressure was mildly increased. PA   peak pressure: 34 mm Hg (S).  Assessment/Plan Principal Problem:   Acute and chronic  respiratory failure (acute-on-chronic) (HCC) Active Problems:   Chronic diastolic CHF (congestive heart failure) (HCC)   Chronic atrial fibrillation (HCC)   Hypertension   COPD (chronic obstructive pulmonary disease) (HCC)   Generalized weakness  (please populate well all problems here in Problem List. (For example, if patient is on BP meds at home and you resume or decide to hold them, it is a problem that needs to be her. Same for CAD, COPD, HLD and so on)   Acute on chronic hypoxemic respiratory failure: Attribute to COVID-19 pneumonia.  Patient started on remdesivir by pharmacy.  Patient's oxygen saturations have been above 92%, therefore does not meet criteria for Decadron.  Patient is currently oxygenating well sats above 96% on 2 L of nasal cannula we will continue her on 2 L nasal cannula with her goal sats to be above 95%. Patient placed on contact isolation in negative pressure room.  Her home regimen with Breo Ellipta, Combivent, Singular discontinued.  Patient is on chronic O2 at 2 L nasal cannula at home.  Chronic diastolic congestive heart failure: Home regimen with Lasix 40 mg is changed to 20 mg, we will continue patient's Aldactone at 25 mg, we will also hold patient's metolazone 2.5 mg.  Currently patient is stable has 1+ pitting edema, no JVD no rales on auscultation.  Patient denies any complaints of chest pain right now.  We will admit patient with close monitoring. Echocardiogram from April 2018 reviewed results as above.  Chronic atrial fibrillation:  Patient is currently in rate controlled A. fib, she was on Coumadin for her atrial fibrillation, per ED MD patient was asked to stop taking her Coumadin.  Patient's current INR is still elevated at 7.  Patient had no bleeding. We will continue patient on her diltiazem 120 mg daily, atenolol 25 mg.  Hypertension: Patient to continue her atenolol 25 mg, Aldactone 25 mg. Cardiac, carbohydrate consistent diet.   COPD:  Patient's home regimen with Breo Ellipta, Combivent, Singulair continued.   Stable no inspiratory expiratory wheezes.   Hypothyroidism: Patient is to continue her levothyroxine at current dose.  DVT prophylaxis: SCD Code Status: Full code Family Communication: None at bedside Disposition Plan: Expect discharge in 5 to 7 days to home for short-term rehab depending on patient's mobility and PT assessment.  Consults called: None  Admission status: Inpatient admission to stepdown with telemetry monitoring.    Gertha Calkin MD [facility]   If 7PM-7AM, please contact night-coverage [email or web address] Password [facility]  08/29/2019, 2:57 PM

## 2019-08-29 NOTE — ED Triage Notes (Signed)
Pt arrives via [facility] from home with a Covid+ test on Wednesday. Pt is reporting increased weakness and N/V at this time.

## 2019-08-29 NOTE — ED Provider Notes (Signed)
[facility] Emergency Department Provider Note   ____________________________________________   First MD Initiated Contact with Patient 08/29/19 [phone]     (approximate)  I have reviewed the triage vital signs and the nursing notes.   HISTORY  Chief Complaint Covid+    HPI Maureen Wilson is a 78 y.o. female who presents to the ED from home via EMS with a chief complaint of generalized weakness and myalgias.  Patient has been taking care of multiple family members with COVID-19.  She herself tested positive at the [facility] department on Wednesday.  Has bilateral trochanteric bursitis, being treated by orthopedics.  Complains of generalized weakness, myalgias especially pain in both hips, increased weakness and occasional nausea/vomiting.  Denies fever but notes cough.  Denies chest pain, shortness of breath, abdominal pain, dysuria, diarrhea.  Denies recent travel or trauma.       Past Medical History:  Diagnosis Date  . Asthma   . Atrial fibrillation (HCC)   . Back pain   . Bronchitis    hx of  . CHF (congestive heart failure) (HCC)   . Chronic kidney disease   . Chronic respiratory failure (HCC)   . COPD (chronic obstructive pulmonary disease) (HCC)   . Emphysema lung (HCC)   . Hypertension     Patient Active Problem List   Diagnosis Date Noted  . 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 Closed dislocation of left hip (HCC) 01/16/2017  . A-fib (HCC) 01/16/2017  . Chronic diastolic CHF (congestive heart failure) (HCC) 01/16/2017  . COPD with chronic bronchitis (HCC) 01/16/2017  . Grief 01/19/2016  . Depression, major, recurrent, moderate (HCC) 01/19/2016  . ARF (acute renal failure) (HCC) 01/18/2016  . Sepsis (HCC) 01/18/2016  . Aspiration pneumonia (HCC) 10/01/2015  . Fall 09/29/2015  . Gait instability 09/29/2015    Past Surgical History:  Procedure Laterality Date  . ABDOMINAL HYSTERECTOMY     . PARTIAL HIP ARTHROPLASTY Right   . TOTAL HIP ARTHROPLASTY Left   . TOTAL HIP REVISION Left 01/17/2017   Procedure: LEFT TOTAL HIP REVISION;  Surgeon: Samson Frederic, MD;  Location: [facility] ORS;  Service: Orthopedics;  Laterality: Left;    Prior to Admission medications   Medication Sig Start Date End Date Taking? Authorizing Provider  albuterol (PROVENTIL) (2.5 MG/3ML) 0.083% nebulizer solution Inhale 3 mLs into the lungs every 4 (four) hours as needed for wheezing or shortness of breath.     [provider]  albuterol-ipratropium (COMBIVENT) 18-103 MCG/ACT inhaler Inhale 1-2 puffs into the lungs every 4 (four) hours as needed for wheezing or shortness of breath.     [provider]  ALPRAZolam Prudy Feeler) 0.25 MG tablet Take 0.25 mg by mouth daily as needed. ANXIETY 11/07/17   [provider]  aspirin EC 81 MG tablet Take 81 mg by mouth daily.    [provider]  atenolol (TENORMIN) 25 MG tablet Take 25 mg by mouth daily.     [provider]  atorvastatin (LIPITOR) 20 MG tablet Take 20 mg by mouth at bedtime.     [provider]  cholecalciferol (VITAMIN D) 1000 units tablet Take 2,000 Units by mouth daily.    [provider]  clonazePAM (KLONOPIN) 1 MG tablet Take 0.5-1 mg by mouth 2 (two) times daily. Pt takes one-half tablet in the morning, and one tablet at bedtime.    [provider]  diltiazem (DILACOR XR) 180 MG 24 hr capsule Take  180 mg by mouth daily.    [provider]  docusate sodium (COLACE) 100 MG capsule Take 1 capsule (100 mg total) by mouth 2 (two) times daily. 01/21/17   Jerald Kief, MD  escitalopram (LEXAPRO) 20 MG tablet Take 20 mg by mouth daily.    [provider]  esomeprazole (NEXIUM) 20 MG capsule Take 20 mg by mouth 2 (two) times daily before a meal.    [provider]  fluticasone (FLONASE) 50 MCG/ACT nasal spray Place 2 sprays into both nostrils daily as needed for  rhinitis.     [provider]  fluticasone furoate-vilanterol (BREO ELLIPTA) 100-25 MCG/INH AEPB Inhale 1 puff into the lungs daily. 03/27/16   [provider]  furosemide (LASIX) 40 MG tablet Take 1 tablet (40 mg total) by mouth every other day. Patient taking differently: Take 40 mg by mouth See admin instructions. Takes sun,tue,thur,sat 01/21/16   Enedina Finner, MD  levothyroxine (SYNTHROID, LEVOTHROID) 50 MCG tablet Take 50 mcg by mouth daily before breakfast. Pt takes this dose on Sunday, Tuesday, Wednesday, Friday, and Saturday.    [provider]  metolazone (ZAROXOLYN) 2.5 MG tablet Take 2.5 mg by mouth 2 (two) times a week. 2.5 mg every Monday & Thursday 11/01/16   [provider]  montelukast (SINGULAIR) 10 MG tablet Take 10 mg by mouth at bedtime.    [provider]  Multiple Vitamin (MULTIVITAMIN WITH MINERALS) TABS tablet Take 1 tablet by mouth daily.    [provider]  Multiple Vitamins-Minerals (CENTRUM SILVER PO) Take 1 tablet by mouth daily.    [provider]  oxyCODONE (OXY IR/ROXICODONE) 5 MG immediate release tablet Take 1-2 tablets (5-10 mg total) by mouth every 4 (four) hours as needed for moderate pain or severe pain. Patient not taking: Reported on 11/29/2017 01/21/17   Jerald Kief, MD  potassium chloride SA (K-DUR,KLOR-CON) 20 MEQ tablet Take 1 tablet (20 mEq total) by mouth every other day. Patient taking differently: Take 10-20 mEq by mouth 2 (two) times daily. 10 meq every morning and 20 meq every evening 01/21/16   Enedina Finner, MD  predniSONE (DELTASONE) 10 MG tablet 50 mg daily x 2 days, then 40 mg daily x 2 days, then 30 mg daily x 2 days, then 20 mg daily x 2 days, then 10 mg daily x 2 days. 05/12/19   Tommie Sams, DO  predniSONE (DELTASONE) 5 MG tablet Take 5 mg by mouth daily with breakfast.    [provider]  senna (SENOKOT) 8.6 MG TABS tablet Take 1 tablet (8.6 mg total) by mouth 2 (two) times  daily. 01/21/17   Jerald Kief, MD  tamsulosin (FLOMAX) 0.4 MG CAPS capsule Take 0.4 mg by mouth daily.     [provider]  warfarin (COUMADIN) 2 MG tablet Take 1-2 mg by mouth See admin instructions. 2 mg on mon,wed,fri,sun & 1 mg tues,thurs,sat 12/12/16   [provider]    Allergies Amlodipine, Hydralazine, Iohexol, Metoprolol, Olmesartan, Other, Ramipril, Risperidone, Sulfa antibiotics, Telmisartan, and Tiotropium  Family History  Problem Relation Age of Onset  . Hypertension Son     Social History Social History   Tobacco Use  . Smoking status: Never Smoker  . Smokeless tobacco: Never Used  Substance Use Topics  . Alcohol use: No    Alcohol/week: 0.0 standard drinks  . Drug use: No    Review of Systems  Constitutional: Positive for generalized weakness and myalgias.  No  fever/chills Eyes: No visual changes. ENT: No sore throat. Cardiovascular: Denies chest pain. Respiratory: Denies shortness of breath. Gastrointestinal: No abdominal pain.  Positive for occasional nausea and vomiting.  No diarrhea.  No constipation. Genitourinary: Negative for dysuria. Musculoskeletal: Positive for bilateral hip pain.  Negative for back pain. Skin: Negative for rash. Neurological: Negative for headaches, focal weakness or numbness.   ____________________________________________   PHYSICAL EXAM:  VITAL SIGNS: ED Triage Vitals  Enc Vitals Group     BP      Pulse      Resp      Temp      Temp src      SpO2      Weight      Height      Head Circumference      Peak Flow      Pain Score      Pain Loc      Pain Edu?      Excl. in [place]?     Constitutional: Alert and oriented.  Elderly appearing and in mild acute distress. Eyes: Conjunctivae are normal. PERRL. EOMI. Head: Atraumatic. Nose: No congestion/rhinnorhea. Mouth/Throat: Mucous membranes are moist.  Oropharynx non-erythematous. Neck: No stridor.  Supple neck without meningismus. Cardiovascular:  Normal rate, regular rhythm. Grossly normal heart sounds.  Good peripheral circulation. Respiratory: Normal respiratory effort.  No retractions. Lungs CTAB.  Active dry cough noted. Gastrointestinal: Soft and nontender to light or deep palpation. No distention. No abdominal bruits. No CVA tenderness. Musculoskeletal: Pelvis is stable.  Bilateral hips mildly tender to palpation.  Full range of motion both hips with some pain.  Patient was able to stand and pivot from wheelchair to bed with assistance.  No joint effusions. Neurologic:  Normal speech and language. No gross focal neurologic deficits are appreciated. MAEx4.  Skin:  Skin is warm, dry and intact. No rash noted.  No petechiae. Psychiatric: Mood and affect are normal. Speech and behavior are normal.  ____________________________________________   LABS (all labs ordered are listed, but only abnormal results are displayed)  Labs Reviewed  CBC WITH DIFFERENTIAL/PLATELET - Abnormal; Notable for the following components:      Result Value   RBC 3.57 (*)    Hemoglobin 11.8 (*)    HCT 35.0 (*)    Platelets 124 (*)    Abs Immature Granulocytes 0.08 (*)    All other components within normal limits  COMPREHENSIVE METABOLIC PANEL - Abnormal; Notable for the following components:   Sodium 128 (*)    Chloride 90 (*)    BUN 25 (*)    Creatinine, Ser 1.31 (*)    Calcium 8.1 (*)    Total Protein 6.1 (*)    Albumin 3.0 (*)    Total Bilirubin 1.5 (*)    GFR calc non Af Amer 39 (*)    GFR calc Af Amer 45 (*)    All other components within normal limits  URINALYSIS, COMPLETE (UACMP) WITH MICROSCOPIC - Abnormal; Notable for the following components:   Color, Urine YELLOW (*)    APPearance CLOUDY (*)    Protein, ur 30 (*)    Leukocytes,Ua LARGE (*)    WBC, UA >50 (*)    Bacteria, UA RARE (*)    Non Squamous Epithelial PRESENT (*)    All other components within normal limits  PROTIME-INR - Abnormal; Notable for the following components:    Prothrombin Time 60.4 (*)    INR 7.0 (*)    All other components  within normal limits  FIBRIN DERIVATIVES D-DIMER ([facility] ONLY) - Abnormal; Notable for the following components:   Fibrin derivatives D-dimer [facility] 1,043.88 (*)    All other components within normal limits  LACTATE DEHYDROGENASE - Abnormal; Notable for the following components:   LDH 299 (*)    All other components within normal limits  FIBRINOGEN - Abnormal; Notable for the following components:   Fibrinogen 574 (*)    All other components within normal limits  TROPONIN I (HIGH SENSITIVITY) - Abnormal; Notable for the following components:   Troponin I (High Sensitivity) 18 (*)    All other components within normal limits  CULTURE, BLOOD (ROUTINE X 2)  CULTURE, BLOOD (ROUTINE X 2)  URINE CULTURE  SARS CORONAVIRUS 2 BY RT PCR (HOSPITAL ORDER, PERFORMED IN [place] [facility])  LACTIC ACID, PLASMA  FERRITIN  TRIGLYCERIDES  C-REACTIVE PROTEIN  PROCALCITONIN  POC SARS CORONAVIRUS 2 AG -  ED  POC SARS CORONAVIRUS 2 AG  TROPONIN I (HIGH SENSITIVITY)   ____________________________________________  EKG  ED ECG REPORT I, , J, the attending physician, personally viewed and interpreted this ECG.   Date: 08/29/2019  EKG Time: 0454  Rate: 95  Rhythm: atrial fibrillation, rate 95  Axis: Normal  Intervals:none  ST&T Change: Nonspecific  ____________________________________________  RADIOLOGY  ED MD interpretation: Chest x-ray consistent with COVID-19 pneumonia, unremarkable pelvis x-ray  Official radiology report(s): Dg Pelvis Portable  Result Date: 08/29/2019 CLINICAL DATA:  Bilateral hip pain. EXAM: PORTABLE PELVIS 1-2 VIEWS COMPARISON:  Pelvic radiograph 01/16/2017 FINDINGS: Unipolar right hip arthroplasty place. Hardware is intact were visualized. Distal aspect of the femoral stem is not included in the field of view. Left hip arthroplasty in expected alignment. Cerclage wires about the proximal  femoral component. Distal femoral stem not included in the field of view. Mild heterotopic ossification medially. Pelvic ring is intact. Pubic symphysis and sacroiliac joints are congruent. Tacks from prior hernia repair in the right inguinal region. Multiple pelvic phleboliths. There are vascular calcifications. IMPRESSION: 1. Intact bilateral hip arthroplasties. Distal aspect of both femoral stems not included in the field of view. 2. No periprosthetic or acute abnormality. Electronically Signed   By: Narda RutherfordMelanie  Sanford M.D.   On: 08/29/2019 06:10   Dg Chest Port 1 View  Result Date: 08/29/2019 CLINICAL DATA:  COVID-19. Cough. EXAM: PORTABLE CHEST 1 VIEW COMPARISON:  Radiograph 11/29/2017 FINDINGS: Unchanged cardiomegaly and mediastinal contours. Aortic atherosclerosis. Bilateral heterogeneous lung opacities. Mild peribronchial thickening. No pleural fluid or pneumothorax. Chronic changes of both shoulders. IMPRESSION: 1. Heterogeneous bilateral lung opacities, pattern consistent with COVID-19 pneumonia. 2. Chronic cardiomegaly, unchanged since February 2019. Aortic Atherosclerosis (ICD10-I70.0). Electronically Signed   By: Narda RutherfordMelanie  Sanford M.D.   On: 08/29/2019 06:08    ____________________________________________   PROCEDURES  Procedure(s) performed (including Critical Care):  Procedures   ____________________________________________   INITIAL IMPRESSION / ASSESSMENT AND PLAN / ED COURSE  As part of my medical decision making, I reviewed the following data within the electronic MEDICAL RECORD NUMBER Nursing notes reviewed and incorporated, Labs reviewed, EKG interpreted, Old chart reviewed, Radiograph reviewed and Notes from prior ED visits     Berle Mullatricia King Lipton was evaluated in Emergency Department on 08/29/2019 for the symptoms described in the history of present illness. She was evaluated in the context of the global COVID-19 pandemic, which necessitated consideration that the patient  might be at risk for infection with the SARS-CoV-2 virus that causes COVID-19. Institutional protocols and algorithms that pertain to the evaluation  of patients at risk for COVID-19 are in a state of rapid change based on information released by regulatory bodies including the CDC and federal and state organizations. These policies and algorithms were followed during the patient's care in the ED.    79 year old female who presents with generalized weakness, myalgias especially in bilateral hips in the setting of COVID-19 illness.  Differential diagnosis includes but is not limited to COVID-19 pneumonia, UTI, electrolyte abnormalities, metabolic disorders, etc.  Of note, patient takes Coumadin.  I see her cardiologist recently held her Coumadin for supratherapeutic INR.  In addition to sepsis and COVID-19 lab work, will check INR.  Will image chest and pelvis.  IV fluid resuscitation for weakness.  Will reassess.   Clinical Course as of Aug 28 717  Sat Aug 29, 2019  4782 Urinalysis noted.  Will administer IV Rocephin.   [JS]  0645 Rapid Covid antigen test is negative.  Patient was tested by the [facility] department so unfortunately I am unable to verify documentation of her positive test.  However, her symptoms and chest x-ray strongly suggest she has COVID-19.  Nursing supervisor has authorized rapid PCR test.  If this is confirmatory, then I anticipate patient will require transfer to [facility] for generalized weakness, UTI and Covid pneumonia.   [JS]  [id number] Care transferred to Dr. Fanny Bien at shift change.  Noted INR 7.0.  Records reviewed reveal patient recently had a visit with her PCP and was told to hold her Coumadin for supratherapeutic INR at 5.  Patient does not exhibit active bleeding.  Does not require acute reversal; hold Coumadin.   [JS]    Clinical Course User Index [JS] Irean Hong, MD     ____________________________________________   FINAL CLINICAL IMPRESSION(S) / ED  DIAGNOSES  Final diagnoses:  COVID-19  Weakness  Urinary tract infection without hematuria, site unspecified     ED Discharge Orders    None       Note:  This document was prepared using Dragon voice recognition software and may include unintentional dictation errors.   Irean Hong, MD 08/29/19 [phone]

## 2019-08-29 NOTE — ED Provider Notes (Signed)
Admission discussed with [facility] hospitalist team, they report that at the present time the patient will not receive a bed likely today, they recommend patient be admitted to [facility] under the hospitalist service but will remain on the list for transfer if a bed becomes available   Delman Kitten, MD 08/29/19 1020

## 2019-08-29 NOTE — ED Notes (Signed)
Pt placed on 2L for comfort. Pt's sat on RA 93-95% with 2L sat level at 97%-98%.

## 2019-08-30 LAB — COMPREHENSIVE METABOLIC PANEL
ALT: 19 U/L (ref 0–44)
AST: 40 U/L (ref 15–41)
Albumin: 2.7 g/dL — ABNORMAL LOW (ref 3.5–5.0)
Alkaline Phosphatase: 53 U/L (ref 38–126)
Anion gap: 11 (ref 5–15)
BUN: 22 mg/dL (ref 8–23)
CO2: 25 mmol/L (ref 22–32)
Calcium: 7.9 mg/dL — ABNORMAL LOW (ref 8.9–10.3)
Chloride: 93 mmol/L — ABNORMAL LOW (ref 98–111)
Creatinine, Ser: 1.27 mg/dL — ABNORMAL HIGH (ref 0.44–1.00)
GFR calc Af Amer: 47 mL/min — ABNORMAL LOW (ref >60)
GFR calc non Af Amer: 40 mL/min — ABNORMAL LOW (ref >60)
Glucose, Bld: 83 mg/dL (ref 70–99)
Potassium: 2.9 mmol/L — ABNORMAL LOW (ref 3.5–5.1)
Sodium: 129 mmol/L — ABNORMAL LOW (ref 135–145)
Total Bilirubin: 1.3 mg/dL — ABNORMAL HIGH (ref 0.3–1.2)
Total Protein: 5.7 g/dL — ABNORMAL LOW (ref 6.5–8.1)

## 2019-08-30 LAB — PROTIME-INR
INR: 8 (ref 0.8–1.2)
Prothrombin Time: 67.3 seconds — ABNORMAL HIGH (ref 11.4–15.2)

## 2019-08-30 LAB — CBC
HCT: 32.5 % — ABNORMAL LOW (ref 36.0–46.0)
Hemoglobin: 11.2 g/dL — ABNORMAL LOW (ref 12.0–15.0)
MCH: 32.5 pg (ref 26.0–34.0)
MCHC: 34.5 g/dL (ref 30.0–36.0)
MCV: 94.2 fL (ref 80.0–100.0)
Platelets: 106 10*3/uL — ABNORMAL LOW (ref 150–400)
RBC: 3.45 MIL/uL — ABNORMAL LOW (ref 3.87–5.11)
RDW: 13.8 % (ref 11.5–15.5)
WBC: 6.1 10*3/uL (ref 4.0–10.5)
nRBC: 0 % (ref 0.0–0.2)

## 2019-08-30 MED ORDER — ONDANSETRON HCL 4 MG/2ML IJ SOLN
4.0000 mg | Freq: Four times a day (QID) | INTRAMUSCULAR | Status: DC | PRN
  Administered 2019-08-30 – 2019-09-01 (×2): 4 mg via INTRAVENOUS
  Filled 2019-08-30: qty 2

## 2019-08-30 MED ORDER — DEXAMETHASONE SODIUM PHOSPHATE 10 MG/ML IJ SOLN
6.0000 mg | INTRAMUSCULAR | Status: DC
  Administered 2019-08-30 – 2019-09-01 (×3): 6 mg via INTRAVENOUS
  Filled 2019-08-30 (×4): qty 0.6

## 2019-08-30 MED ORDER — FLUTICASONE PROPIONATE 50 MCG/ACT NA SUSP
2.0000 | Freq: Every day | NASAL | Status: DC
  Administered 2019-08-31: 2 via NASAL
  Filled 2019-08-30: qty 16

## 2019-08-30 MED ORDER — GUAIFENESIN-DM 100-10 MG/5ML PO SYRP
10.0000 mL | ORAL_SOLUTION | ORAL | Status: DC | PRN
  Administered 2019-08-31 – 2019-09-02 (×5): 10 mL via ORAL
  Filled 2019-08-30 (×4): qty 10

## 2019-08-30 MED ORDER — VITAMIN C 500 MG PO TABS
500.0000 mg | ORAL_TABLET | Freq: Every day | ORAL | Status: DC
  Administered 2019-08-31 – 2019-09-02 (×3): 500 mg via ORAL
  Filled 2019-08-30 (×3): qty 1

## 2019-08-30 MED ORDER — NYSTATIN 100000 UNIT/ML MT SUSP
5.0000 mL | Freq: Four times a day (QID) | OROMUCOSAL | Status: DC
  Administered 2019-08-30 – 2019-09-02 (×12): 500000 [IU] via ORAL
  Filled 2019-08-30 (×15): qty 5

## 2019-08-30 MED ORDER — GUAIFENESIN-DM 100-10 MG/5ML PO SYRP
5.0000 mL | ORAL_SOLUTION | ORAL | Status: DC | PRN
  Administered 2019-08-30: 5 mL via ORAL
  Filled 2019-08-30 (×2): qty 5

## 2019-08-30 MED ORDER — SALINE SPRAY 0.65 % NA SOLN
1.0000 | NASAL | Status: DC | PRN
  Administered 2019-08-30 – 2019-09-01 (×2): 1 via NASAL
  Filled 2019-08-30 (×2): qty 44

## 2019-08-30 MED ORDER — LACTATED RINGERS IV SOLN
INTRAVENOUS | Status: DC
  Administered 2019-08-30 – 2019-08-31 (×3): via INTRAVENOUS

## 2019-08-30 MED ORDER — ONDANSETRON HCL 4 MG/2ML IJ SOLN
INTRAMUSCULAR | Status: AC
  Administered 2019-08-30: 4 mg via INTRAVENOUS
  Filled 2019-08-30: qty 2

## 2019-08-30 MED ORDER — HYDROCOD POLST-CPM POLST ER 10-8 MG/5ML PO SUER
5.0000 mL | Freq: Two times a day (BID) | ORAL | Status: DC | PRN
  Administered 2019-08-31 – 2019-09-02 (×3): 5 mL via ORAL
  Filled 2019-08-30 (×3): qty 5

## 2019-08-30 MED ORDER — ACETAMINOPHEN 325 MG PO TABS
650.0000 mg | ORAL_TABLET | Freq: Four times a day (QID) | ORAL | Status: DC | PRN
  Administered 2019-09-02: 650 mg via ORAL
  Filled 2019-08-30: qty 2

## 2019-08-30 MED ORDER — ZINC SULFATE 220 (50 ZN) MG PO CAPS
220.0000 mg | ORAL_CAPSULE | Freq: Every day | ORAL | Status: DC
  Administered 2019-08-30 – 2019-09-02 (×4): 220 mg via ORAL
  Filled 2019-08-30 (×4): qty 1

## 2019-08-30 MED ORDER — OXYCODONE HCL 5 MG PO TABS
5.0000 mg | ORAL_TABLET | Freq: Four times a day (QID) | ORAL | Status: DC | PRN
  Administered 2019-09-02: 5 mg via ORAL
  Filled 2019-08-30: qty 1

## 2019-08-30 MED ORDER — HYDROCOD POLST-CPM POLST ER 10-8 MG/5ML PO SUER
5.0000 mL | Freq: Two times a day (BID) | ORAL | Status: DC
  Administered 2019-08-30 – 2019-08-31 (×4): 5 mL via ORAL
  Filled 2019-08-30 (×4): qty 5

## 2019-08-30 NOTE — Progress Notes (Signed)
[facility] Progress Note  Patient: Maureen Wilson QMV:[id number]   PCP: Phineas Inches, MD DOB: 04/13/1941   DOA: 08/29/2019   DOS: 08/30/2019   Date of Service: the patient was seen and examined on 08/30/2019  Chief Complaint  Patient presents with  . Covid+     Brief hospital course: Maureen Wilson is a 78 y.o. female with medical history significant of high blood pressure, chronic A. fib, COPD seen in the emergency room today for generalized weakness not feeling well since past Saturday.  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  Patient came to the emergency room via EMS for this weakness and nausea and vomiting.  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  Patient lives home with spouse and she is usually able to cook their meals and takes care of him and herself. Pt was diagnosed with Covid-19 Infection on Wednesday and has been progressively weak and fatigued.  Currently further plan is continue current treatment.  Subjective: Continues to have cough and shortness of breath.  Resolved generalized fatigue and weakness.  No nausea no vomiting.  Abdominal pain.  No chest pain.  Assessment and Plan: Scheduled Meds: . aspirin EC  81 mg Oral Daily  . atorvastatin  20 mg Oral QHS  . chlorpheniramine-HYDROcodone  5 mL Oral Q12H  . escitalopram  20 mg Oral Daily  . ferrous sulfate  325 mg Oral Q24H  . fluticasone  2 spray Each Nare Daily  . fluticasone furoate-vilanterol  1 puff Inhalation Daily  . levothyroxine  50 mcg Oral Once per day on Sun Tue Wed Fri Sat  . [START ON 08/31/2019] levothyroxine  75 mcg Oral Once per day on Mon Thu  . montelukast  10 mg Oral QHS  . nystatin  5 mL Oral QID  . pantoprazole  40 mg Oral Daily  . sodium chloride flush  3 mL Intravenous Q12H  . tamsulosin  0.4 mg Oral Daily   Continuous Infusions: . cefTRIAXone (ROCEPHIN)  IV 1 g (08/30/19 1017)  .  lactated ringers 50 mL/hr at 08/30/19 1245  . remdesivir 100 mg in NS 250 mL 100 mg (08/30/19 0934)   PRN Meds: acetaminophen, ALPRAZolam, benzonatate, bisacodyl, guaiFENesin-dextromethorphan, Ipratropium-Albuterol, ondansetron (ZOFRAN) IV, oxyCODONE, sodium chloride, sodium chloride flush  1.  Acute COVID-19 Viral illness Lab Results  Component Value Date   SARSCOV2NAA POSITIVE (A) 08/29/2019   CXR: hazy bilateral peripheral opacities  Recent Labs    08/29/19 0506  FERRITIN 296  LDH 299*  CRP 9.7*    Tmax last 24 hours:  Temp (24hrs), Avg:98.2 F (36.8 C), Min:97.8 F (36.6 C), Max:98.5 F (36.9 C)   Oxygen requirements: 94% on 3 LPM  Antibiotics: Ceftriaxone for UTI Diuretics: Currently on hold actually resumed IV fluids Vitamin C and Zinc: Started on 08/30/2019 DVT Prophylaxis: Therapeutic Anticoagulation with Warfarin  Remdesivir: Started on 08/29/2019 Steroids: Started on 08/30/2019 Actemra: Not indicated for now.  Prone positioning: Patient encouraged to stay in prone position as much as possible.  PPE During this encounter: Patient Isolation: Airborne + Droplet + Contact HCP PPE: CAPR, gown. gloves Patient PPE: None  The treatment plan and use of medications and known side effects were discussed with patient/family. It was clearly explained that there is no proven definitive treatment for COVID-19 infection yet. Any medications used here are based on case reports/anecdotal data which are not peer-reviewed  and has not been studied using randomized control trials.  Complete risks and long-term side effects are unknown, however in the best clinical judgment they seem to be of some clinical benefit rather than medical risks.  Patient/family agree with the treatment plan and want to receive these treatments as indicated.   Chronic diastolic congestive heart failure: Patient is on Lasix, Aldactone as well as on metolazone. Currently holding oral medication in the  setting of hypotension as well as providing gentle IV hydration. Monitor for volume overload currently euvolemic.  Chronic atrial fibrillation:  Patient is currently in rate controlled A. fib, she was on Coumadin for her atrial fibrillation, per ED MD patient was asked to stop taking her Coumadin. Patient's current INR is still elevated.  Patient had no bleeding. Monitor for now.  Hypertension: Hypotension in the setting of COVID-19 illness. Hold off on patient's home antihypertensive medication. We will give IV fluids. Cardiac, carbohydrate consistent diet.   COPD: Patient's home regimen with Breo Ellipta, Combivent, Singulair continued.   Stable no inspiratory expiratory wheezes.   Hypothyroidism: Patient is to continue her levothyroxine at current dose.  Obesity Body mass index is 35.96 kg/m.  Outpatient dietary consultation.  Diet: Cardiac diet  DVT Prophylaxis: Therapeutic Anticoagulation with Warfarin   Advance goals of care discussion: Full code  Family Communication: no family was present at bedside, at the time of interview.   Disposition:  Discharge to be determined.  Consultants: none Procedures: none  Antibiotics: Anti-infectives (From admission, onward)   Start     Dose/Rate Route Frequency Ordered Stop   08/30/19 0800  remdesivir 100 mg in sodium chloride 0.9 % 250 mL IVPB     100 mg 500 mL/hr over 30 Minutes Intravenous Daily 08/29/19 0852 09/03/19 0759   08/30/19 0800  cefTRIAXone (ROCEPHIN) 1 g in sodium chloride 0.9 % 100 mL IVPB     1 g 200 mL/hr over 30 Minutes Intravenous Every 24 hours 08/29/19 1358     08/29/19 1400  cefTRIAXone (ROCEPHIN) 1 g in sodium chloride 0.9 % 100 mL IVPB  Status:  Discontinued     1 g 200 mL/hr over 30 Minutes Intravenous Daily 08/29/19 1224 08/29/19 1357   08/29/19 0900  remdesivir 200 mg in sodium chloride 0.9 % 250 mL IVPB     200 mg 500 mL/hr over 30 Minutes Intravenous Once 08/29/19 0852 08/29/19 1235    08/29/19 0600  cefTRIAXone (ROCEPHIN) 1 g in sodium chloride 0.9 % 100 mL IVPB     1 g 200 mL/hr over 30 Minutes Intravenous  Once 08/29/19 0557 08/29/19 0723       Objective: Physical Exam: Vitals:   08/30/19 0839 08/30/19 0911 08/30/19 1143 08/30/19 1643  BP: (!) 97/54 (!) 94/53 93/60 (!) 102/54  Pulse: 82 93 96 97  Resp: 18  18 (!) 22  Temp: 98 F (36.7 C)   98.3 F (36.8 C)  TempSrc: Oral   Oral  SpO2: 94% 93% 94% 94%  Weight:      Height:        Intake/Output Summary (Last 24 hours) at 08/30/2019 1910 Last data filed at 08/30/2019 1400 Gross per 24 hour  Intake 511.91 ml  Output 1650 ml  Net -1138.09 ml   Filed Weights   08/29/19 0448  Weight: 92.1 kg   General: alert and oriented to time, place, and person. Appear in moderate distress, affect appropriate Eyes: PERRL, Conjunctiva normal ENT: Oral Mucosa Clear, moist  Neck: no JVD, no  Abnormal Mass Or lumps Cardiovascular: S1 and S2 Present, no Murmur,  Respiratory: increased respiratory effort, Bilateral Air entry equal and Decreased, no signs of accessory muscle use, bilateral  Crackles, no wheezes Abdomen: Bowel Sound present, Soft and no tenderness, no hernia Skin: no rashes  Extremities: no Pedal edema, no calf tenderness Neurologic: without any new focal findings Gait not checked due to patient safety concerns  Data Reviewed: I have personally reviewed and interpreted daily labs, tele strips, imagings as discussed above. I reviewed all nursing notes, pharmacy notes, vitals, pertinent old records I have discussed plan of care as described above with RN and patient/family.  CBC: Recent Labs  Lab 08/29/19 0506 08/30/19 0418  WBC 7.5 6.1  NEUTROABS 6.2  --   HGB 11.8* 11.2*  HCT 35.0* 32.5*  MCV 98.0 94.2  PLT 124* 106*   Basic Metabolic Panel: Recent Labs  Lab 08/29/19 0506 08/30/19 0418  NA 128* 129*  K 3.5 2.9*  CL 90* 93*  CO2 26 25  GLUCOSE 83 83  BUN 25* 22  CREATININE 1.31* 1.27*   CALCIUM 8.1* 7.9*    Liver Function Tests: Recent Labs  Lab 08/29/19 0506 08/30/19 0418  AST 40 40  ALT 21 19  ALKPHOS 58 53  BILITOT 1.5* 1.3*  PROT 6.1* 5.7*  ALBUMIN 3.0* 2.7*   No results for input(s): LIPASE, AMYLASE in the last 168 hours. No results for input(s): AMMONIA in the last 168 hours. Coagulation Profile: Recent Labs  Lab 08/29/19 0506 08/30/19 0418  INR 7.1* 8.0*   Cardiac Enzymes: No results for input(s): CKTOTAL, CKMB, CKMBINDEX, TROPONINI in the last 168 hours. BNP (last 3 results) No results for input(s): PROBNP in the last 8760 hours. CBG: No results for input(s): GLUCAP in the last 168 hours. Studies: No results found.   Time spent: 35 minutes  Author: Lynden Oxford, MD [facility] 08/30/2019 7:10 PM  To reach On-call, see care teams to locate the attending and reach out to them via [email or web address]. If 7PM-7AM, please contact night-coverage If you still have difficulty reaching the attending provider, please page the [facility] (Director on Call) for [facility] on amion for assistance.

## 2019-08-30 NOTE — Progress Notes (Signed)
Notified NP Ouma of pt's AM INR 8.0, up from 7.1 yesterday. Pt has no signs of active bleeding and VSS. No new orders at this time. Nursing staff will continue to monitor for any changes in patient status.  Earleen Reaper, RN

## 2019-08-30 NOTE — Plan of Care (Signed)

## 2019-08-31 LAB — URINE CULTURE: Culture: >=100000 — AB

## 2019-08-31 LAB — BASIC METABOLIC PANEL
Anion gap: 16 — ABNORMAL HIGH (ref 5–15)
BUN: 26 mg/dL — ABNORMAL HIGH (ref 8–23)
CO2: 22 mmol/L (ref 22–32)
Calcium: 8.5 mg/dL — ABNORMAL LOW (ref 8.9–10.3)
Chloride: 92 mmol/L — ABNORMAL LOW (ref 98–111)
Creatinine, Ser: 1.42 mg/dL — ABNORMAL HIGH (ref 0.44–1.00)
GFR calc Af Amer: 41 mL/min — ABNORMAL LOW (ref >60)
GFR calc non Af Amer: 35 mL/min — ABNORMAL LOW (ref >60)
Glucose, Bld: 143 mg/dL — ABNORMAL HIGH (ref 70–99)
Potassium: 4.2 mmol/L (ref 3.5–5.1)
Sodium: 130 mmol/L — ABNORMAL LOW (ref 135–145)

## 2019-08-31 LAB — COMPREHENSIVE METABOLIC PANEL
ALT: 21 U/L (ref 0–44)
AST: 46 U/L — ABNORMAL HIGH (ref 15–41)
Albumin: 2.6 g/dL — ABNORMAL LOW (ref 3.5–5.0)
Alkaline Phosphatase: 58 U/L (ref 38–126)
Anion gap: 12 (ref 5–15)
BUN: 26 mg/dL — ABNORMAL HIGH (ref 8–23)
CO2: 23 mmol/L (ref 22–32)
Calcium: 7.8 mg/dL — ABNORMAL LOW (ref 8.9–10.3)
Chloride: 93 mmol/L — ABNORMAL LOW (ref 98–111)
Creatinine, Ser: 1.21 mg/dL — ABNORMAL HIGH (ref 0.44–1.00)
GFR calc Af Amer: 50 mL/min — ABNORMAL LOW (ref >60)
GFR calc non Af Amer: 43 mL/min — ABNORMAL LOW (ref >60)
Glucose, Bld: 140 mg/dL — ABNORMAL HIGH (ref 70–99)
Potassium: 3.1 mmol/L — ABNORMAL LOW (ref 3.5–5.1)
Sodium: 128 mmol/L — ABNORMAL LOW (ref 135–145)
Total Bilirubin: 0.9 mg/dL (ref 0.3–1.2)
Total Protein: 5.7 g/dL — ABNORMAL LOW (ref 6.5–8.1)

## 2019-08-31 LAB — MAGNESIUM
Magnesium: 1.5 mg/dL — ABNORMAL LOW (ref 1.7–2.4)
Magnesium: 1.9 mg/dL (ref 1.7–2.4)

## 2019-08-31 LAB — FERRITIN: Ferritin: 314 ng/mL — ABNORMAL HIGH (ref 11–307)

## 2019-08-31 LAB — CBC WITH DIFFERENTIAL/PLATELET
Abs Immature Granulocytes: 0.08 10*3/uL — ABNORMAL HIGH (ref 0.00–0.07)
Basophils Absolute: 0 10*3/uL (ref 0.0–0.1)
Basophils Relative: 0 %
Eosinophils Absolute: 0 10*3/uL (ref 0.0–0.5)
Eosinophils Relative: 0 %
HCT: 33.1 % — ABNORMAL LOW (ref 36.0–46.0)
Hemoglobin: 11.6 g/dL — ABNORMAL LOW (ref 12.0–15.0)
Immature Granulocytes: 1 %
Lymphocytes Relative: 5 %
Lymphs Abs: 0.3 10*3/uL — ABNORMAL LOW (ref 0.7–4.0)
MCH: 32.7 pg (ref 26.0–34.0)
MCHC: 35 g/dL (ref 30.0–36.0)
MCV: 93.2 fL (ref 80.0–100.0)
Monocytes Absolute: 0.1 10*3/uL (ref 0.1–1.0)
Monocytes Relative: 2 %
Neutro Abs: 5.4 10*3/uL (ref 1.7–7.7)
Neutrophils Relative %: 92 %
Platelets: 108 10*3/uL — ABNORMAL LOW (ref 150–400)
RBC: 3.55 MIL/uL — ABNORMAL LOW (ref 3.87–5.11)
RDW: 13.5 % (ref 11.5–15.5)
WBC: 5.9 10*3/uL (ref 4.0–10.5)
nRBC: 0 % (ref 0.0–0.2)

## 2019-08-31 LAB — PROTIME-INR
INR: 5.7 (ref 0.8–1.2)
Prothrombin Time: 51.4 seconds — ABNORMAL HIGH (ref 11.4–15.2)

## 2019-08-31 LAB — GLUCOSE, CAPILLARY
Glucose-Capillary: 74 mg/dL (ref 70–99)
Glucose-Capillary: 126 mg/dL — ABNORMAL HIGH (ref 70–99)

## 2019-08-31 LAB — C-REACTIVE PROTEIN: CRP: 15.1 mg/dL — ABNORMAL HIGH

## 2019-08-31 LAB — FIBRIN DERIVATIVES D-DIMER (ARMC ONLY): Fibrin derivatives D-dimer (ARMC): 966.47 ng/mL (FEU) — ABNORMAL HIGH (ref 0.00–499.00)

## 2019-08-31 MED ORDER — WARFARIN - PHARMACIST DOSING INPATIENT
Freq: Every day | Status: DC
  Administered 2019-09-02: 17:00:00

## 2019-08-31 MED ORDER — MAGNESIUM SULFATE 2 GM/50ML IV SOLN
2.0000 g | Freq: Once | INTRAVENOUS | Status: AC
  Administered 2019-08-31: 2 g via INTRAVENOUS
  Filled 2019-08-31: qty 50

## 2019-08-31 MED ORDER — POTASSIUM CHLORIDE CRYS ER 20 MEQ PO TBCR
40.0000 meq | EXTENDED_RELEASE_TABLET | ORAL | Status: AC
  Administered 2019-08-31 (×2): 40 meq via ORAL
  Filled 2019-08-31 (×2): qty 2

## 2019-08-31 MED ORDER — BENZONATATE 100 MG PO CAPS
200.0000 mg | ORAL_CAPSULE | Freq: Two times a day (BID) | ORAL | Status: DC
  Administered 2019-08-31 – 2019-09-02 (×4): 200 mg via ORAL
  Filled 2019-08-31 (×4): qty 2

## 2019-08-31 NOTE — Progress Notes (Signed)
No acute changes with pt heart rate. [id number]. No distress noted.

## 2019-08-31 NOTE — Progress Notes (Addendum)
[facility] Progress Note  Patient: Maureen Wilson QIO:[id number]   PCP: Joanna Hews, MD DOB: Mar 25, 1941   DOA: 08/29/2019   DOS: 08/31/2019   Date of Service: the patient was seen and examined on 08/31/2019  chief complaint Shortness of breath and cough and fever  Brief hospital course: Ziza Hastings is a 78 y.o. female with medical history significant of high blood pressure, chronic A. fib, COPD seen in the emergency room today for generalized weakness not feeling well since past Saturday.  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  Patient came to the emergency room via EMS for this weakness and nausea and vomiting.  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  Patient lives home with spouse and she is usually able to cook their meals and takes care of him and herself. Pt was diagnosed with Covid-19 Infection on Wednesday and has been progressively weak and fatigued.  Currently further plan is continue current treatment.  Subjective: Cough and shortness of breath is about the same.  No nausea no vomiting.  No fever no chills.  Chest pain improving.  Assessment and Plan: Scheduled Meds: . aspirin EC  81 mg Oral Daily  . atorvastatin  20 mg Oral QHS  . benzonatate  200 mg Oral BID  . dexamethasone (DECADRON) injection  6 mg Intravenous Q24H  . escitalopram  20 mg Oral Daily  . ferrous sulfate  325 mg Oral Q24H  . fluticasone furoate-vilanterol  1 puff Inhalation Daily  . levothyroxine  50 mcg Oral Once per day on Sun Tue Wed Fri Sat  . levothyroxine  75 mcg Oral Once per day on Mon Thu  . montelukast  10 mg Oral QHS  . nystatin  5 mL Oral QID  . pantoprazole  40 mg Oral Daily  . sodium chloride flush  3 mL Intravenous Q12H  . tamsulosin  0.4 mg Oral Daily  . vitamin C  500 mg Oral Daily  . Warfarin - Pharmacist Dosing Inpatient   Does not apply q1800  . zinc sulfate  220 mg Oral  Daily   Continuous Infusions: . remdesivir 100 mg in NS 250 mL Stopped (08/31/19 0946)   PRN Meds: acetaminophen, ALPRAZolam, bisacodyl, chlorpheniramine-HYDROcodone, guaiFENesin-dextromethorphan, Ipratropium-Albuterol, ondansetron (ZOFRAN) IV, oxyCODONE, sodium chloride, sodium chloride flush  1.  Acute hypoxic respiratory failure secondary to Covid illness Acute COVID-19 Viral illness Lab Results  Component Value Date   SARSCOV2NAA POSITIVE (A) 08/29/2019   CXR: hazy bilateral peripheral opacities  Recent Labs    08/29/19 0506 08/31/19 0416  FERRITIN 296 314*  LDH 299*  --   CRP 9.7* 15.1*    Tmax last 24 hours:  Temp (24hrs), Avg:98.1 F (36.7 C), Min:97.5 F (36.4 C), Max:98.6 F (37 C)  Oxygen requirements: 91% on 2 LPM  Antibiotics: Ceftriaxone for UTI completed. Diuretics: Currently on hold actually received IV fluids Vitamin C and Zinc: Started on 08/30/2019 DVT Prophylaxis: Therapeutic Anticoagulation with Warfarin  Remdesivir: Started on 08/29/2019 Steroids: Started on 08/30/2019 Actemra: Not given so far. CRP is actually trending up.  As well as mild elevation of LFTs as well. Continue current care and treatment but if the trend continues upward will discuss with [facility] providers to see if the patient will be a good candidate for providing Actemra/plasma.  Prone positioning: Patient encouraged to stay in prone position as much as possible.  PPE During  this encounter: Patient Isolation: Airborne + Droplet + Contact HCP PPE: CAPR, gown. gloves Patient PPE: None  The treatment plan and use of medications and known side effects were discussed with patient/family. It was clearly explained that there is no proven definitive treatment for COVID-19 infection yet. Any medications used here are based on case reports/anecdotal data which are not peer-reviewed and has not been studied using randomized control trials.  Complete risks and long-term side effects are unknown,  however in the best clinical judgment they seem to be of some clinical benefit rather than medical risks.  Patient/family agree with the treatment plan and want to receive these treatments as indicated.   Chronic diastolic congestive heart failure: Patient is on Lasix, Aldactone as well as on metolazone. Currently holding oral medication in the setting of hypotension as well as providing gentle IV hydration. Monitor for volume overload currently euvolemic.  Chronic atrial fibrillation:  Supratherapeutic INR Patient is currently in rate controlled A. fib, she was on Coumadin for her atrial fibrillation, per ED MD patient was asked to stop taking her Coumadin. Patient's current INR is still elevated.  Patient had no bleeding. Monitor for now.  Hypertension: Hypotension in the setting of COVID-19 illness. Hold off on patient's home antihypertensive medication. Patient was given IV fluids.  Currently blood pressure better.  We will hold off on the fluids. Cardiac, carbohydrate consistent diet.  COPD: Patient's home regimen with Breo Ellipta, Combivent, Singulair continued.   Stable no inspiratory expiratory wheezes.  Hypothyroidism: Patient is to continue her levothyroxine at current dose.  Obesity Body mass index is 35.23 kg/m.  Outpatient dietary consultation.  Hyponatremia. Hypokalemia. Hypomagnesemia. Replacing potassium and magnesium. We will recheck later in the afternoon. We will discontinue IV fluids and monitor for improvement in sodium level. Free water restriction may be needed tomorrow.  Diet: Cardiac diet  DVT Prophylaxis: Therapeutic Anticoagulation with Warfarin   Advance goals of care discussion: Full code  Family Communication: no family was present at bedside, at the time of interview.   Disposition:  Discharge to be determined.  Consultants: none Procedures: none  Antibiotics: Anti-infectives (From admission, onward)   Start     Dose/Rate Route  Frequency Ordered Stop   08/30/19 0800  remdesivir 100 mg in sodium chloride 0.9 % 250 mL IVPB     100 mg 500 mL/hr over 30 Minutes Intravenous Daily 08/29/19 0852 09/03/19 0759   08/30/19 0800  cefTRIAXone (ROCEPHIN) 1 g in sodium chloride 0.9 % 100 mL IVPB  Status:  Discontinued     1 g 200 mL/hr over 30 Minutes Intravenous Every 24 hours 08/29/19 1358 08/31/19 1258   08/29/19 1400  cefTRIAXone (ROCEPHIN) 1 g in sodium chloride 0.9 % 100 mL IVPB  Status:  Discontinued     1 g 200 mL/hr over 30 Minutes Intravenous Daily 08/29/19 1224 08/29/19 1357   08/29/19 0900  remdesivir 200 mg in sodium chloride 0.9 % 250 mL IVPB     200 mg 500 mL/hr over 30 Minutes Intravenous Once 08/29/19 0852 08/29/19 1235   08/29/19 0600  cefTRIAXone (ROCEPHIN) 1 g in sodium chloride 0.9 % 100 mL IVPB     1 g 200 mL/hr over 30 Minutes Intravenous  Once 08/29/19 0557 08/29/19 0723       Objective: Physical Exam: Vitals:   08/30/19 2011 08/31/19 0609 08/31/19 1007 08/31/19 1524  BP: 120/62 105/62 122/67 (!) 119/58  Pulse: (!) 108 (!) 112 (!) 121 (!) 111  Resp: 20 16  20  Temp: 98.6 F (37 C) 98.1 F (36.7 C) (!) 97.5 F (36.4 C) 98.2 F (36.8 C)  TempSrc: Oral Oral Oral Oral  SpO2: 90% 90% 91% (!) 89%  Weight:  90.2 kg    Height:        Intake/Output Summary (Last 24 hours) at 08/31/2019 1829 Last data filed at 08/31/2019 1745 Gross per 24 hour  Intake 2094.88 ml  Output 1000 ml  Net 1094.88 ml   Filed Weights   08/29/19 0448 08/31/19 0609  Weight: 92.1 kg 90.2 kg   General: alert and oriented to time, place, and person. Appear in mild distress, affect appropriate Eyes: PERRL, Conjunctiva normal ENT: Oral Mucosa Clear, moist  Neck: no JVD, no Abnormal Mass Or lumps Cardiovascular: S1 and S2 Present, no Murmur, peripheral pulses symmetrical Respiratory: good respiratory effort, Bilateral Air entry equal and Decreased, no signs of accessory muscle use, bilateral  Crackles, no wheezes  Abdomen: Bowel Sound present, Soft and no tenderness, no hernia Skin: no rashes  Extremities: no Pedal edema, no calf tenderness Neurologic: without any new focal findings Gait not checked due to patient safety concerns  Data Reviewed: I have personally reviewed and interpreted daily labs, tele strips, imagings as discussed above. I reviewed all nursing notes, pharmacy notes, vitals, pertinent old records I have discussed plan of care as described above with RN and patient/family.  CBC: Recent Labs  Lab 08/29/19 0506 08/30/19 0418 08/31/19 0416  WBC 7.5 6.1 5.9  NEUTROABS 6.2  --  5.4  HGB 11.8* 11.2* 11.6*  HCT 35.0* 32.5* 33.1*  MCV 98.0 94.2 93.2  PLT 124* 106* 108*   Basic Metabolic Panel: Recent Labs  Lab 08/29/19 0506 08/30/19 0418 08/31/19 0416  NA 128* 129* 128*  K 3.5 2.9* 3.1*  CL 90* 93* 93*  CO2 26 25 23   GLUCOSE 83 83 140*  BUN 25* 22 26*  CREATININE 1.31* 1.27* 1.21*  CALCIUM 8.1* 7.9* 7.8*  MG  --   --  1.5*    Liver Function Tests: Recent Labs  Lab 08/29/19 0506 08/30/19 0418 08/31/19 0416  AST 40 40 46*  ALT 21 19 21   ALKPHOS 58 53 58  BILITOT 1.5* 1.3* 0.9  PROT 6.1* 5.7* 5.7*  ALBUMIN 3.0* 2.7* 2.6*   No results for input(s): LIPASE, AMYLASE in the last 168 hours. No results for input(s): AMMONIA in the last 168 hours. Coagulation Profile: Recent Labs  Lab 08/29/19 0506 08/30/19 0418 08/31/19 0416  INR 7.1* 8.0* 5.7*   Cardiac Enzymes: No results for input(s): CKTOTAL, CKMB, CKMBINDEX, TROPONINI in the last 168 hours. BNP (last 3 results) No results for input(s): PROBNP in the last 8760 hours. CBG: Recent Labs  Lab 08/30/19 0907 08/31/19 0801  GLUCAP 74 126*   Studies: No results found.   Time spent: 35 minutes  Author: Lynden OxfordPranav Myleka Moncure, MD [facility] 08/31/2019 6:29 PM  To reach On-call, see care teams to locate the attending and reach out to them via [email or web address]. If 7PM-7AM, please contact night-coverage  If you still have difficulty reaching the attending provider, please page the [facility] ([facility]) for [facility] on amion for assistance.

## 2019-08-31 NOTE — Plan of Care (Signed)
  Problem: Clinical Measurements: Goal: Diagnostic test results will improve Outcome: Progressing Goal: Respiratory complications will improve Outcome: Progressing   Problem: Education: Goal: Knowledge of risk factors and measures for prevention of condition will improve Outcome: Progressing

## 2019-08-31 NOTE — Consult Note (Addendum)
[place] for Warfarin Indication: atrial fibrillation  Allergies  Allergen Reactions  . Amlodipine Swelling and Other (See Comments)    Reaction:  Lip/mouth swelling   . Hydralazine Swelling and Other (See Comments)    Reaction:  Lip/mouth swelling   . Iohexol Hives and Other (See Comments)    Desc: Pt developed hives after receiving iv dye for ct scan.  suggest she be premedicated for future exams, Onset Date: [id number]   . Metoprolol Swelling and Other (See Comments)    Reaction:  Lip/mouth swelling   . Olmesartan Swelling and Other (See Comments)    Reaction:  Lip/mouth swelling   . Other Swelling    Lip/mouth swelling  . Ramipril Swelling and Other (See Comments)    Reaction:  Lip/mouth swelling   . Risperidone Other (See Comments)  . Sulfa Antibiotics Swelling and Other (See Comments)    Reaction:  Lip/mouth swelling  angioedema  . Telmisartan Swelling and Other (See Comments)    Reaction:  Lip/mouth swelling   . Tiotropium Swelling and Other (See Comments)    Reaction:  Lip/mouth swelling     Patient Measurements: Height: 5\' 3"  (160 cm) Weight: 198 lb 13.7 oz (90.2 kg) IBW/kg (Calculated) : 52.4  Vital Signs: Temp: 97.5 F (36.4 C) (11/23 1007) Temp Source: Oral (11/23 1007) BP: 122/67 (11/23 1007) Pulse Rate: 121 (11/23 1007)  Labs: Recent Labs    08/29/19 0506 08/29/19 0645 08/30/19 0418 08/31/19 0416  HGB 11.8*  --  11.2* 11.6*  HCT 35.0*  --  32.5* 33.1*  PLT 124*  --  106* 108*  LABPROT 61.3*  --  67.3* 51.4*  INR 7.1*  --  8.0* 5.7*  CREATININE 1.31*  --  1.27* 1.21*  TROPONINIHS 18* 17  --   --     Estimated Creatinine Clearance: 40.8 mL/min (A) (by C-G formula based on SCr of 1.21 mg/dL (H)).   Medical History: Past Medical History:  Diagnosis Date  . Asthma   . Atrial fibrillation ([place])   . Back pain   . Bronchitis    hx of  . CHF (congestive heart failure) ([place])   . Chronic kidney disease    . Chronic respiratory failure ([place])   . COPD (chronic obstructive pulmonary disease) ([place])   . Emphysema lung ([place])   . Hypertension     Medications:  Medications Prior to Admission  Medication Sig Dispense Refill Last Dose  . ALPRAZolam (XANAX) 0.25 MG tablet Take 0.25 mg by mouth every 8 (eight) hours as needed for anxiety.    Unknown at PRN  . aspirin EC 81 MG tablet Take 81 mg by mouth daily.   08/28/2019 at 0800  . atenolol (TENORMIN) 25 MG tablet Take 25 mg by mouth daily.    08/28/2019 at 0800  . atorvastatin (LIPITOR) 20 MG tablet Take 20 mg by mouth at bedtime.    08/28/2019 at 2000  . cholecalciferol (VITAMIN D) 1000 units tablet Take 2,000 Units by mouth daily.   08/28/2019 at 0800  . clonazePAM (KLONOPIN) 1 MG tablet Take 0.5 mg by mouth 2 (two) times daily.    08/28/2019 at 2000  . diltiazem (DILACOR XR) 120 MG 24 hr capsule Take 120 mg by mouth daily.    08/28/2019 at 0800  . docusate sodium (COLACE) 100 MG capsule Take 100 mg by mouth daily.   08/28/2019 at 0800  . escitalopram (LEXAPRO) 20 MG tablet Take 20 mg by mouth  daily.   08/28/2019 at 0800  . Esomeprazole Magnesium 20 MG TBEC Take 20 mg by mouth See admin instructions. Take 1 tablet (20mg ) by mouth daily and take 1 additional tablet (20mg ) by mouth daily as needed for heartburn/acid reflux symptoms   08/28/2019 at 0800  . ferrous gluconate (FERGON) 240 (27 FE) MG tablet Take 240 mg by mouth daily.   08/28/2019 at 0800  . fluticasone furoate-vilanterol (BREO ELLIPTA) 100-25 MCG/INH AEPB Inhale 1 puff into the lungs daily.   08/28/2019 at 1300  . furosemide (LASIX) 40 MG tablet Take 1 tablet (40 mg total) by mouth every other day. (Patient taking differently: Take 40 mg by mouth See admin instructions. Take 1 tablet (40mg ) by mouth every Sunday, Tuesday, Thursday and Saturday morning) 30 tablet 0 08/28/2019 at 0800  . Ipratropium-Albuterol (COMBIVENT) 20-100 MCG/ACT AERS respimat Inhale 1 puff into the lungs 4 (four)  times daily as needed for wheezing or shortness of breath.    Unknown at PRN  . levothyroxine (SYNTHROID, LEVOTHROID) 50 MCG tablet Take 50-75 mcg by mouth See admin instructions. Take 1 tablet (Wednesday) by mouth every Tuesday, Wednesday, Friday, Saturday and Sunday morning and take 1 tablets (Wednesday) by mouth every Monday and Thursday morning   08/28/2019 at 0700  . metolazone (ZAROXOLYN) 2.5 MG tablet Take 2.5 mg by mouth See admin instructions. Take 1 tablet (2.5mg ) by mouth every Monday, Thursday and Saturday morning   08/27/2019 at 0800  . montelukast (SINGULAIR) 10 MG tablet Take 10 mg by mouth at bedtime.   08/28/2019 at 2000  . Multiple Vitamins-Minerals (CENTRUM SILVER PO) Take 1 tablet by mouth daily.   08/28/2019 at 0800  . potassium chloride SA (K-DUR,KLOR-CON) 20 MEQ tablet Take 1 tablet (20 mEq total) by mouth every other day. (Patient taking differently: Take 10-20 mEq by mouth See admin instructions. Take  tablet (08/29/2019) by mouth every morning and take 1 tablet (08/30/2019) by mouth every evening) 30 tablet 0 08/28/2019 at 2000  . predniSONE (DELTASONE) 5 MG tablet Take 5 mg by mouth See admin instructions. Take 1 tablet (5mg ) by mouth every Sunday, Tuesday, Thursday and Saturday morning   08/28/2019 at 0800  . spironolactone (ALDACTONE) 25 MG tablet Take 25 mg by mouth See admin instructions. Take 1 tablet (25mg ) by mouth every Monday and Thursday morning   08/27/2019 at 0800  . tamsulosin (FLOMAX) 0.4 MG CAPS capsule Take 0.4 mg by mouth daily.    08/28/2019 at 0800  . warfarin (COUMADIN) 2 MG tablet Take 1 mg by mouth every evening.    08/28/2019 at 2000  . predniSONE (DELTASONE) 10 MG tablet 50 mg daily x 2 days, then 40 mg daily x 2 days, then 30 mg daily x 2 days, then 20 mg daily x 2 days, then 10 mg daily x 2 days. (Patient not taking: Reported on 08/29/2019) 30 tablet 0 Not Taking at Unknown time   Scheduled:  . aspirin EC  81 mg Oral Daily  . atorvastatin  20 mg Oral QHS  .  benzonatate  200 mg Oral BID  . dexamethasone (DECADRON) injection  6 mg Intravenous Q24H  . escitalopram  20 mg Oral Daily  . ferrous sulfate  325 mg Oral Q24H  . fluticasone furoate-vilanterol  1 puff Inhalation Daily  . levothyroxine  50 mcg Oral Once per day on Sun Tue Wed Fri Sat  . levothyroxine  75 mcg Oral Once per day on Mon Thu  . montelukast  10 mg  Oral QHS  . nystatin  5 mL Oral QID  . pantoprazole  40 mg Oral Daily  . sodium chloride flush  3 mL Intravenous Q12H  . tamsulosin  0.4 mg Oral Daily  . vitamin C  500 mg Oral Daily  . zinc sulfate  220 mg Oral Daily   Infusions:  . remdesivir 100 mg in NS 250 mL 100 mg (08/31/19 0916)   PRN: acetaminophen, ALPRAZolam, bisacodyl, chlorpheniramine-HYDROcodone, guaiFENesin-dextromethorphan, Ipratropium-Albuterol, ondansetron (ZOFRAN) IV, oxyCODONE, sodium chloride, sodium chloride flush Anti-infectives (From admission, onward)   Start     Dose/Rate Route Frequency Ordered Stop   08/30/19 0800  remdesivir 100 mg in sodium chloride 0.9 % 250 mL IVPB     100 mg 500 mL/hr over 30 Minutes Intravenous Daily 08/29/19 0852 09/03/19 0759   08/30/19 0800  cefTRIAXone (ROCEPHIN) 1 g in sodium chloride 0.9 % 100 mL IVPB  Status:  Discontinued     1 g 200 mL/hr over 30 Minutes Intravenous Every 24 hours 08/29/19 1358 08/31/19 1258   08/29/19 1400  cefTRIAXone (ROCEPHIN) 1 g in sodium chloride 0.9 % 100 mL IVPB  Status:  Discontinued     1 g 200 mL/hr over 30 Minutes Intravenous Daily 08/29/19 1224 08/29/19 1357   08/29/19 0900  remdesivir 200 mg in sodium chloride 0.9 % 250 mL IVPB     200 mg 500 mL/hr over 30 Minutes Intravenous Once 08/29/19 0852 08/29/19 1235   08/29/19 0600  cefTRIAXone (ROCEPHIN) 1 g in sodium chloride 0.9 % 100 mL IVPB     1 g 200 mL/hr over 30 Minutes Intravenous  Once 08/29/19 0557 08/29/19 [id number]      Assessment: Pharmacy consulted to resume warfarin in the patient for afib. INR on admission was 7.1. No data  suggest that INR can be falsely elevated due to COVID.   Home dose : warfarin 1 mg daily.   Date INR Warfarin Dose  11/23 5.7 HOLD        Goal of Therapy:  INR 2-3 Monitor platelets by anticoagulation protocol: Yes   Plan:  INR supratherapeutic. Will continue to hold warfarin until INR < 3. Monitor CBC q3days. Daily INR ordered. Pharmacy will continue to follow.   If discharged. Recommend holding warfarin doses for 3 days and follow up with INR prior to restarting warfarin.   Ronnald RampKishan S Vrinda Heckstall, PharmD, BCPS 08/31/2019,1:33 PM
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
## 2019-09-01 DIAGNOSIS — F331 Major depressive disorder, recurrent, moderate: Secondary | ICD-10-CM

## 2019-09-01 LAB — COMPREHENSIVE METABOLIC PANEL
ALT: 21 U/L (ref 0–44)
AST: 52 U/L — ABNORMAL HIGH (ref 15–41)
Albumin: 2.6 g/dL — ABNORMAL LOW (ref 3.5–5.0)
Alkaline Phosphatase: 63 U/L (ref 38–126)
Anion gap: 12 (ref 5–15)
BUN: 27 mg/dL — ABNORMAL HIGH (ref 8–23)
CO2: 21 mmol/L — ABNORMAL LOW (ref 22–32)
Calcium: 8.1 mg/dL — ABNORMAL LOW (ref 8.9–10.3)
Chloride: 95 mmol/L — ABNORMAL LOW (ref 98–111)
Creatinine, Ser: 1.23 mg/dL — ABNORMAL HIGH (ref 0.44–1.00)
GFR calc Af Amer: 49 mL/min — ABNORMAL LOW (ref >60)
GFR calc non Af Amer: 42 mL/min — ABNORMAL LOW (ref >60)
Glucose, Bld: 172 mg/dL — ABNORMAL HIGH (ref 70–99)
Potassium: 4.3 mmol/L (ref 3.5–5.1)
Sodium: 128 mmol/L — ABNORMAL LOW (ref 135–145)
Total Bilirubin: 1 mg/dL (ref 0.3–1.2)
Total Protein: 5.7 g/dL — ABNORMAL LOW (ref 6.5–8.1)

## 2019-09-01 LAB — CBC WITH DIFFERENTIAL/PLATELET
Abs Immature Granulocytes: 0.09 10*3/uL — ABNORMAL HIGH (ref 0.00–0.07)
Basophils Absolute: 0 10*3/uL (ref 0.0–0.1)
Basophils Relative: 0 %
Eosinophils Absolute: 0 10*3/uL (ref 0.0–0.5)
Eosinophils Relative: 0 %
HCT: 33.2 % — ABNORMAL LOW (ref 36.0–46.0)
Hemoglobin: 11.6 g/dL — ABNORMAL LOW (ref 12.0–15.0)
Immature Granulocytes: 1 %
Lymphocytes Relative: 4 %
Lymphs Abs: 0.3 10*3/uL — ABNORMAL LOW (ref 0.7–4.0)
MCH: 32.6 pg (ref 26.0–34.0)
MCHC: 34.9 g/dL (ref 30.0–36.0)
MCV: 93.3 fL (ref 80.0–100.0)
Monocytes Absolute: 0.3 10*3/uL (ref 0.1–1.0)
Monocytes Relative: 3 %
Neutro Abs: 8.5 10*3/uL — ABNORMAL HIGH (ref 1.7–7.7)
Neutrophils Relative %: 92 %
Platelets: 130 10*3/uL — ABNORMAL LOW (ref 150–400)
RBC: 3.56 MIL/uL — ABNORMAL LOW (ref 3.87–5.11)
RDW: 13.5 % (ref 11.5–15.5)
WBC: 9.2 10*3/uL (ref 4.0–10.5)
nRBC: 0 % (ref 0.0–0.2)

## 2019-09-01 LAB — FERRITIN: Ferritin: 308 ng/mL — ABNORMAL HIGH (ref 11–307)

## 2019-09-01 LAB — FIBRIN DERIVATIVES D-DIMER (ARMC ONLY): Fibrin derivatives D-dimer (ARMC): 805.12 ng/mL (FEU) — ABNORMAL HIGH (ref 0.00–499.00)

## 2019-09-01 LAB — PROTIME-INR
INR: 6.5 (ref 0.8–1.2)
Prothrombin Time: 57.5 seconds — ABNORMAL HIGH (ref 11.4–15.2)

## 2019-09-01 LAB — C-REACTIVE PROTEIN: CRP: 10.6 mg/dL — ABNORMAL HIGH (ref <1.0)

## 2019-09-01 LAB — MAGNESIUM: Magnesium: 2 mg/dL (ref 1.7–2.4)

## 2019-09-01 LAB — GLUCOSE, CAPILLARY
Glucose-Capillary: 167 mg/dL — ABNORMAL HIGH (ref 70–99)
Glucose-Capillary: 171 mg/dL — ABNORMAL HIGH (ref 70–99)

## 2019-09-01 MED ORDER — AMIODARONE IV BOLUS ONLY 150 MG/100ML: 150.0000 mg | Freq: Once | INTRAVENOUS | Status: DC

## 2019-09-01 MED ORDER — MENTHOL 3 MG MT LOZG
1.0000 | LOZENGE | Freq: Four times a day (QID) | OROMUCOSAL | Status: DC
  Administered 2019-09-01 (×2): 3 mg via ORAL
  Filled 2019-09-01: qty 9

## 2019-09-01 MED ORDER — GUAIFENESIN 100 MG/5ML PO SOLN
5.0000 mL | Freq: Two times a day (BID) | ORAL | Status: DC
  Filled 2019-09-01 (×2): qty 5

## 2019-09-01 MED ORDER — AMIODARONE IV BOLUS ONLY 150 MG/100ML
150.0000 mg | Freq: Once | INTRAVENOUS | Status: AC
  Administered 2019-09-01: 150 mg via INTRAVENOUS
  Filled 2019-09-01: qty 100

## 2019-09-01 NOTE — Progress Notes (Signed)
Pt HR 118-130. Non sustained, HX of afib and this is not an acute change for her

## 2019-09-01 NOTE — Progress Notes (Signed)
CRITICAL VALUE ALERT  Critical Value:  INR  6.5  Date & Time Notied:  09/01/19 0830  Provider Notified: patel, Diamantina [place] 09/01/19 0835  Orders Received/Actions taken: no new orders. Warfarin currently on hold

## 2019-09-01 NOTE — Progress Notes (Signed)
[facility] [place] NAME: Maureen Wilson    MR#:  [id number]  DATE OF BIRTH:  [birth year]  SUBJECTIVE:  patient complains of significant dry cough. She wants to try some cough drops/lozenges. Trying to eat. Still short winded requiring about 5 L of nasal cannula oxygen no fever. No nausea vomiting.  REVIEW OF SYSTEMS:   Review of Systems  Constitutional: Negative for chills, fever and weight loss.  HENT: Negative for ear discharge, ear pain and nosebleeds.   Eyes: Negative for blurred vision, pain and discharge.  Respiratory: Positive for cough and shortness of breath. Negative for sputum production, wheezing and stridor.   Cardiovascular: Negative for chest pain, palpitations, orthopnea and PND.  Gastrointestinal: Negative for abdominal pain, diarrhea, nausea and vomiting.  Genitourinary: Negative for frequency and urgency.  Musculoskeletal: Positive for neck pain. Negative for back pain and joint pain.  Neurological: Positive for weakness. Negative for sensory change, speech change and focal weakness.  Psychiatric/Behavioral: Negative for depression and hallucinations. The patient is not nervous/anxious.    Tolerating Diet: yes Tolerating PT: ambulatory home. She walks around as her ADLs  DRUG ALLERGIES:   Allergies  Allergen Reactions  . Amlodipine Swelling and Other (See Comments)    Reaction:  Lip/mouth swelling   . Hydralazine Swelling and Other (See Comments)    Reaction:  Lip/mouth swelling   . Iohexol Hives and Other (See Comments)    Desc: Pt developed hives after receiving iv dye for ct scan.  suggest she be premedicated for future exams, Onset Date: [id number]   . Metoprolol Swelling and Other (See Comments)    Reaction:  Lip/mouth swelling   . Olmesartan Swelling and Other (See Comments)    Reaction:  Lip/mouth swelling   . Other Swelling    Lip/mouth swelling  . Ramipril Swelling and Other (See Comments)   Reaction:  Lip/mouth swelling   . Risperidone Other (See Comments)  . Sulfa Antibiotics Swelling and Other (See Comments)    Reaction:  Lip/mouth swelling  angioedema  . Telmisartan Swelling and Other (See Comments)    Reaction:  Lip/mouth swelling   . Tiotropium Swelling and Other (See Comments)    Reaction:  Lip/mouth swelling     VITALS:  Blood pressure 124/79, pulse 97, temperature 98.3 F (36.8 C), temperature source Oral, resp. rate 20, height 5\' 3"  (1.6 m), weight 89.4 kg, SpO2 91 %.  PHYSICAL EXAMINATION:   Physical Exam  GENERAL:  78 y.o.-year-old patient lying in the bed with no acute distress. Morbid obese EYES: Pupils equal, round, reactive to light and accommodation. No scleral icterus. Extraocular muscles intact.  HEENT: Head atraumatic, normocephalic. Oropharynx and nasopharynx clear.  NECK:  Supple, no jugular venous distention. No thyroid enlargement, no tenderness.  LUNGS: Normal breath sounds bilaterally, no wheezing, rales, rhonchi. No use of accessory muscles of respiration.  CARDIOVASCULAR: S1, S2 normal. No murmurs, rubs, or gallops.  ABDOMEN: Soft, nontender, nondistended. Bowel sounds present. No organomegaly or mass.  EXTREMITIES: No cyanosis, clubbing or edema b/l.    NEUROLOGIC: Cranial nerves II through XII are intact. No focal Motor or sensory deficits b/l. Overall appears deconditioned PSYCHIATRIC:  patient is alert and oriented x 3.  SKIN: No obvious rash, lesion, or ulcer.   LABORATORY PANEL:  CBC Recent Labs  Lab 09/01/19 0705  WBC 9.2  HGB 11.6*  HCT 33.2*  PLT 130*    Chemistries  Recent Labs  Lab 09/01/19 0705  NA  128*  K 4.3  CL 95*  CO2 21*  GLUCOSE 172*  BUN 27*  CREATININE 1.23*  CALCIUM 8.1*  MG 2.0  AST 52*  ALT 21  ALKPHOS 63  BILITOT 1.0   Cardiac Enzymes No results for input(s): TROPONINI in the last 168 hours. RADIOLOGY:  No results found. ASSESSMENT AND PLAN:   [facility] 78  y.o.femalewith medical history significant ofhigh blood pressure, chronic A. fib, COPD seen in the emergency room today for generalized weakness not feeling well since past Saturday. 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 Patient came to the emergency room via EMS for this weakness and nausea and vomiting.  1. Acute hypoxic respiratory failure secondary to COVID 19 illness/pneumonia -patient came in with nausea vomiting. She thinks the aide came who came by to help her likely made her sick with covered -chest x-ray with his bilateral opacities. Patient has underlying COPD -will give her scheduled Robitussin, continued thousand X as needed and add cough lozenges. Oxygen requirements: 91% on 5 LPM Antibiotics: Ceftriaxone for UTI completed. Diuretics: Currently on hold  Vitamin C and Zinc: Started on 08/30/2019 DVT Prophylaxis: Therapeutic Anticoagulation with Warfarin  Remdesivir: Started on 08/29/2019 Steroids: Started on 08/30/2019. CRP 9.7--15.6--10.6    2. Chronic diastolic heart failure -patient on Lasix Aldactone and metolazone--holding at present -Does not appear to be in HF  3. Chronic atrial fibrillation -heart rate controlled-- not on any rate blocking agent at presnt due to soft bP  (at home on atenolol) -continue warfarin for anticoagulation -INR 6.5--no  Bleeding, monitor for now  4. History of hypertension with relative hypotension -holding BP meds  5. Hypothyroidism continue Synthroid  6. COPD -continue oral inhalers Singulair and Breo Ellipta   Family communication : son timothy Fines Consults : none Discharge Disposition :TBD CODE STATUS: Full DVT Prophylaxis :warfarin  TOTAL TIME TAKING CARE OF THIS PATIENT: *30* minutes.  >50% time spent on counselling and coordination of care  POSSIBLE D/C IN * few** DAYS, DEPENDING ON CLINICAL CONDITION.  Note: This dictation was prepared with Dragon  dictation along with smaller phrase technology. Any transcriptional errors that result from this process are unintentional.  Enedina Finner M.D on 09/01/2019 at 2:50 PM  Between 7am to 6pm - Pager - [phone]  After 6pm go to [email or web address]  [facility]   CC: Primary care physician; Joanna Hews, MDPatient ID: Berle Mull, female   DOB: 10-22-1940, 78 y.o.   MRN: [id number]

## 2019-09-01 NOTE — Plan of Care (Signed)
  Problem: Clinical Measurements: Goal: Diagnostic test results will improve Outcome: Progressing Goal: Respiratory complications will improve Outcome: Progressing   Problem: Respiratory: Goal: Will maintain a patent airway Outcome: Progressing   

## 2019-09-01 NOTE — Consult Note (Signed)
ANTICOAGULATION CONSULT NOTE   Pharmacy Consult for Warfarin Indication: atrial fibrillation  Allergies  Allergen Reactions  . Amlodipine Swelling and Other (See Comments)    Reaction:  Lip/mouth swelling   . Hydralazine Swelling and Other (See Comments)    Reaction:  Lip/mouth swelling   . Iohexol Hives and Other (See Comments)    Desc: Pt developed hives after receiving iv dye for ct scan.  suggest she be premedicated for future exams, Onset Date: [id number]   . Metoprolol Swelling and Other (See Comments)    Reaction:  Lip/mouth swelling   . Olmesartan Swelling and Other (See Comments)    Reaction:  Lip/mouth swelling   . Other Swelling    Lip/mouth swelling  . Ramipril Swelling and Other (See Comments)    Reaction:  Lip/mouth swelling   . Risperidone Other (See Comments)  . Sulfa Antibiotics Swelling and Other (See Comments)    Reaction:  Lip/mouth swelling  angioedema  . Telmisartan Swelling and Other (See Comments)    Reaction:  Lip/mouth swelling   . Tiotropium Swelling and Other (See Comments)    Reaction:  Lip/mouth swelling     Patient Measurements: Height: 5\' 3"  (160 cm) Weight: 197 lb 1.5 oz (89.4 kg) IBW/kg (Calculated) : 52.4  Vital Signs: Temp: 98.3 F (36.8 C) (11/24 0735) Temp Source: Oral (11/24 0735) BP: 124/79 (11/24 0735) Pulse Rate: 97 (11/24 0735)  Labs: Recent Labs    08/30/19 0418 08/31/19 0416 08/31/19 1750 09/01/19 0705  HGB 11.2* 11.6*  --  11.6*  HCT 32.5* 33.1*  --  33.2*  PLT 106* 108*  --  130*  LABPROT 67.3* 51.4*  --  57.5*  INR 8.0* 5.7*  --  6.5*  CREATININE 1.27* 1.21* 1.42* 1.23*    Estimated Creatinine Clearance: 40 mL/min (A) (by C-G formula based on SCr of 1.23 mg/dL (H)).   Medical History: Past Medical History:  Diagnosis Date  . Asthma   . Atrial fibrillation (HCC)   . Back pain   . Bronchitis    hx of  . CHF (congestive heart failure) (HCC)   . Chronic kidney disease   . Chronic respiratory failure  (HCC)   . COPD (chronic obstructive pulmonary disease) (HCC)   . Emphysema lung (HCC)   . Hypertension     Medications:  Medications Prior to Admission  Medication Sig Dispense Refill Last Dose  . ALPRAZolam (XANAX) 0.25 MG tablet Take 0.25 mg by mouth every 8 (eight) hours as needed for anxiety.    Unknown at PRN  . aspirin EC 81 MG tablet Take 81 mg by mouth daily.   08/28/2019 at 0800  . atenolol (TENORMIN) 25 MG tablet Take 25 mg by mouth daily.    08/28/2019 at 0800  . atorvastatin (LIPITOR) 20 MG tablet Take 20 mg by mouth at bedtime.    08/28/2019 at 2000  . cholecalciferol (VITAMIN D) 1000 units tablet Take 2,000 Units by mouth daily.   08/28/2019 at 0800  . clonazePAM (KLONOPIN) 1 MG tablet Take 0.5 mg by mouth 2 (two) times daily.    08/28/2019 at 2000  . diltiazem (DILACOR XR) 120 MG 24 hr capsule Take 120 mg by mouth daily.    08/28/2019 at 0800  . docusate sodium (COLACE) 100 MG capsule Take 100 mg by mouth daily.   08/28/2019 at 0800  . escitalopram (LEXAPRO) 20 MG tablet Take 20 mg by mouth daily.   08/28/2019 at 0800  . Esomeprazole Magnesium 20 MG  TBEC Take 20 mg by mouth See admin instructions. Take 1 tablet (20mg ) by mouth daily and take 1 additional tablet (20mg ) by mouth daily as needed for heartburn/acid reflux symptoms   08/28/2019 at 0800  . ferrous gluconate (FERGON) 240 (27 FE) MG tablet Take 240 mg by mouth daily.   08/28/2019 at 0800  . fluticasone furoate-vilanterol (BREO ELLIPTA) 100-25 MCG/INH AEPB Inhale 1 puff into the lungs daily.   08/28/2019 at 1300  . furosemide (LASIX) 40 MG tablet Take 1 tablet (40 mg total) by mouth every other day. (Patient taking differently: Take 40 mg by mouth See admin instructions. Take 1 tablet (40mg ) by mouth every Sunday, Tuesday, Thursday and Saturday morning) 30 tablet 0 08/28/2019 at 0800  . Ipratropium-Albuterol (COMBIVENT) 20-100 MCG/ACT AERS respimat Inhale 1 puff into the lungs 4 (four) times daily as needed for wheezing  or shortness of breath.    Unknown at PRN  . levothyroxine (SYNTHROID, LEVOTHROID) 50 MCG tablet Take 50-75 mcg by mouth See admin instructions. Take 1 tablet (50mcg) by mouth every Tuesday, Wednesday, Friday, Saturday and Sunday morning and take 1 tablets (75mcg) by mouth every Monday and Thursday morning   08/28/2019 at 0700  . metolazone (ZAROXOLYN) 2.5 MG tablet Take 2.5 mg by mouth See admin instructions. Take 1 tablet (2.5mg ) by mouth every Monday, Thursday and Saturday morning   08/27/2019 at 0800  . montelukast (SINGULAIR) 10 MG tablet Take 10 mg by mouth at bedtime.   08/28/2019 at 2000  . Multiple Vitamins-Minerals (CENTRUM SILVER PO) Take 1 tablet by mouth daily.   08/28/2019 at 0800  . potassium chloride SA (K-DUR,KLOR-CON) 20 MEQ tablet Take 1 tablet (20 mEq total) by mouth every other day. (Patient taking differently: Take 10-20 mEq by mouth See admin instructions. Take  tablet (10meq) by mouth every morning and take 1 tablet (20meq) by mouth every evening) 30 tablet 0 08/28/2019 at 2000  . predniSONE (DELTASONE) 5 MG tablet Take 5 mg by mouth See admin instructions. Take 1 tablet (5mg ) by mouth every Sunday, Tuesday, Thursday and Saturday morning   08/28/2019 at 0800  . spironolactone (ALDACTONE) 25 MG tablet Take 25 mg by mouth See admin instructions. Take 1 tablet (25mg ) by mouth every Monday and Thursday morning   08/27/2019 at 0800  . tamsulosin (FLOMAX) 0.4 MG CAPS capsule Take 0.4 mg by mouth daily.    08/28/2019 at 0800  . warfarin (COUMADIN) 2 MG tablet Take 1 mg by mouth every evening.    08/28/2019 at 2000  . predniSONE (DELTASONE) 10 MG tablet 50 mg daily x 2 days, then 40 mg daily x 2 days, then 30 mg daily x 2 days, then 20 mg daily x 2 days, then 10 mg daily x 2 days. (Patient not taking: Reported on 08/29/2019) 30 tablet 0 Not Taking at Unknown time   Scheduled:  . aspirin EC  81 mg Oral Daily  . atorvastatin  20 mg Oral QHS  . benzonatate  200 mg Oral BID  .  dexamethasone (DECADRON) injection  6 mg Intravenous Q24H  . escitalopram  20 mg Oral Daily  . ferrous sulfate  325 mg Oral Q24H  . fluticasone furoate-vilanterol  1 puff Inhalation Daily  . levothyroxine  50 mcg Oral Once per day on Sun Tue Wed Fri Sat  . levothyroxine  75 mcg Oral Once per day on Mon Thu  . montelukast  10 mg Oral QHS  . nystatin  5 mL Oral QID  .  pantoprazole  40 mg Oral Daily  . sodium chloride flush  3 mL Intravenous Q12H  . tamsulosin  0.4 mg Oral Daily  . vitamin C  500 mg Oral Daily  . Warfarin - Pharmacist Dosing Inpatient   Does not apply q1800  . zinc sulfate  220 mg Oral Daily   Infusions:  . remdesivir 100 mg in NS 250 mL Stopped (08/31/19 0946)   PRN: acetaminophen, ALPRAZolam, bisacodyl, chlorpheniramine-HYDROcodone, guaiFENesin-dextromethorphan, Ipratropium-Albuterol, ondansetron (ZOFRAN) IV, oxyCODONE, sodium chloride, sodium chloride flush Anti-infectives (From admission, onward)   Start     Dose/Rate Route Frequency Ordered Stop   08/30/19 0800  remdesivir 100 mg in sodium chloride 0.9 % 250 mL IVPB     100 mg 500 mL/hr over 30 Minutes Intravenous Daily 08/29/19 0852 09/03/19 0759   08/30/19 0800  cefTRIAXone (ROCEPHIN) 1 g in sodium chloride 0.9 % 100 mL IVPB  Status:  Discontinued     1 g 200 mL/hr over 30 Minutes Intravenous Every 24 hours 08/29/19 1358 08/31/19 1258   08/29/19 1400  cefTRIAXone (ROCEPHIN) 1 g in sodium chloride 0.9 % 100 mL IVPB  Status:  Discontinued     1 g 200 mL/hr over 30 Minutes Intravenous Daily 08/29/19 1224 08/29/19 1357   08/29/19 0900  remdesivir 200 mg in sodium chloride 0.9 % 250 mL IVPB     200 mg 500 mL/hr over 30 Minutes Intravenous Once 08/29/19 0852 08/29/19 1235   08/29/19 0600  cefTRIAXone (ROCEPHIN) 1 g in sodium chloride 0.9 % 100 mL IVPB     1 g 200 mL/hr over 30 Minutes Intravenous  Once 08/29/19 0557 08/29/19 1191      Assessment: Pharmacy consulted to resume warfarin in the patient for afib. INR  on admission was 7.1. No data suggest that INR can be falsely elevated due to COVID.   Home dose : warfarin 1 mg daily.   Date INR Warfarin Dose  11/23 5.7 HOLD  11/24 6.5 HOLD    Goal of Therapy:  INR 2-3 Monitor platelets by anticoagulation protocol: Yes   Plan:  INR supratherapeutic. Will continue to hold warfarin until INR < 3. Monitor CBC q3days. Daily INR ordered. Pharmacy will continue to follow. Abx d/c'ed.   If discharged. Recommend holding warfarin doses for 4 days and follow up with INR prior to restarting warfarin.   Oswald Hillock, PharmD, BCPS 09/01/2019,9:35 AM

## 2019-09-02 ENCOUNTER — Inpatient Hospital Stay (HOSPITAL_COMMUNITY)
Admission: AD | Admit: 2019-09-02 | Discharge: 2019-09-08 | DRG: 208 | Disposition: E | Payer: Medicare Other | Source: Other Acute Inpatient Hospital | Attending: Critical Care Medicine | Admitting: Critical Care Medicine

## 2019-09-02 ENCOUNTER — Inpatient Hospital Stay: Payer: Medicare Other

## 2019-09-02 ENCOUNTER — Inpatient Hospital Stay
Admission: AD | Admit: 2019-09-02 | Payer: Medicare Other | Source: Other Acute Inpatient Hospital | Admitting: Internal Medicine

## 2019-09-02 DIAGNOSIS — Z7901 Long term (current) use of anticoagulants: Secondary | ICD-10-CM

## 2019-09-02 DIAGNOSIS — I5032 Chronic diastolic (congestive) heart failure: Secondary | ICD-10-CM | POA: Diagnosis present

## 2019-09-02 DIAGNOSIS — U071 COVID-19: Principal | ICD-10-CM | POA: Diagnosis present

## 2019-09-02 DIAGNOSIS — I4891 Unspecified atrial fibrillation: Secondary | ICD-10-CM | POA: Diagnosis not present

## 2019-09-02 DIAGNOSIS — G4733 Obstructive sleep apnea (adult) (pediatric): Secondary | ICD-10-CM | POA: Diagnosis present

## 2019-09-02 DIAGNOSIS — I482 Chronic atrial fibrillation, unspecified: Secondary | ICD-10-CM | POA: Diagnosis present

## 2019-09-02 DIAGNOSIS — J9621 Acute and chronic respiratory failure with hypoxia: Secondary | ICD-10-CM | POA: Diagnosis not present

## 2019-09-02 DIAGNOSIS — Z9981 Dependence on supplemental oxygen: Secondary | ICD-10-CM

## 2019-09-02 DIAGNOSIS — Z882 Allergy status to sulfonamides status: Secondary | ICD-10-CM

## 2019-09-02 DIAGNOSIS — Z515 Encounter for palliative care: Secondary | ICD-10-CM | POA: Diagnosis not present

## 2019-09-02 DIAGNOSIS — Z79890 Hormone replacement therapy (postmenopausal): Secondary | ICD-10-CM | POA: Diagnosis not present

## 2019-09-02 DIAGNOSIS — N183 Chronic kidney disease, stage 3 unspecified: Secondary | ICD-10-CM | POA: Diagnosis present

## 2019-09-02 DIAGNOSIS — Z978 Presence of other specified devices: Secondary | ICD-10-CM

## 2019-09-02 DIAGNOSIS — Z9911 Dependence on respirator [ventilator] status: Secondary | ICD-10-CM | POA: Diagnosis not present

## 2019-09-02 DIAGNOSIS — J80 Acute respiratory distress syndrome: Secondary | ICD-10-CM | POA: Diagnosis present

## 2019-09-02 DIAGNOSIS — Z8249 Family history of ischemic heart disease and other diseases of the circulatory system: Secondary | ICD-10-CM | POA: Diagnosis not present

## 2019-09-02 DIAGNOSIS — N179 Acute kidney failure, unspecified: Secondary | ICD-10-CM | POA: Diagnosis present

## 2019-09-02 DIAGNOSIS — I10 Essential (primary) hypertension: Secondary | ICD-10-CM | POA: Diagnosis not present

## 2019-09-02 DIAGNOSIS — Z66 Do not resuscitate: Secondary | ICD-10-CM | POA: Diagnosis not present

## 2019-09-02 DIAGNOSIS — Z79899 Other long term (current) drug therapy: Secondary | ICD-10-CM

## 2019-09-02 DIAGNOSIS — J1289 Other viral pneumonia: Secondary | ICD-10-CM

## 2019-09-02 DIAGNOSIS — I509 Heart failure, unspecified: Secondary | ICD-10-CM

## 2019-09-02 DIAGNOSIS — I130 Hypertensive heart and chronic kidney disease with heart failure and stage 1 through stage 4 chronic kidney disease, or unspecified chronic kidney disease: Secondary | ICD-10-CM | POA: Diagnosis present

## 2019-09-02 DIAGNOSIS — Z6836 Body mass index (BMI) 36.0-36.9, adult: Secondary | ICD-10-CM

## 2019-09-02 DIAGNOSIS — R791 Abnormal coagulation profile: Secondary | ICD-10-CM | POA: Diagnosis not present

## 2019-09-02 DIAGNOSIS — I5033 Acute on chronic diastolic (congestive) heart failure: Secondary | ICD-10-CM | POA: Diagnosis not present

## 2019-09-02 DIAGNOSIS — J9601 Acute respiratory failure with hypoxia: Secondary | ICD-10-CM | POA: Diagnosis not present

## 2019-09-02 DIAGNOSIS — Z888 Allergy status to other drugs, medicaments and biological substances status: Secondary | ICD-10-CM

## 2019-09-02 DIAGNOSIS — E669 Obesity, unspecified: Secondary | ICD-10-CM | POA: Diagnosis present

## 2019-09-02 DIAGNOSIS — J441 Chronic obstructive pulmonary disease with (acute) exacerbation: Secondary | ICD-10-CM | POA: Diagnosis not present

## 2019-09-02 DIAGNOSIS — E871 Hypo-osmolality and hyponatremia: Secondary | ICD-10-CM | POA: Diagnosis present

## 2019-09-02 DIAGNOSIS — J439 Emphysema, unspecified: Secondary | ICD-10-CM | POA: Diagnosis present

## 2019-09-02 DIAGNOSIS — R34 Anuria and oliguria: Secondary | ICD-10-CM | POA: Diagnosis present

## 2019-09-02 DIAGNOSIS — Z7982 Long term (current) use of aspirin: Secondary | ICD-10-CM

## 2019-09-02 DIAGNOSIS — Z7951 Long term (current) use of inhaled steroids: Secondary | ICD-10-CM

## 2019-09-02 DIAGNOSIS — Z96643 Presence of artificial hip joint, bilateral: Secondary | ICD-10-CM | POA: Diagnosis present

## 2019-09-02 DIAGNOSIS — J449 Chronic obstructive pulmonary disease, unspecified: Secondary | ICD-10-CM | POA: Diagnosis present

## 2019-09-02 LAB — CBC WITH DIFFERENTIAL/PLATELET
Abs Immature Granulocytes: 0.1 10*3/uL — ABNORMAL HIGH (ref 0.00–0.07)
Abs Immature Granulocytes: 0.1 10*3/uL — ABNORMAL HIGH (ref 0.00–0.07)
Basophils Absolute: 0 10*3/uL (ref 0.0–0.1)
Basophils Absolute: 0 10*3/uL (ref 0.0–0.1)
Basophils Relative: 0 %
Basophils Relative: 0 %
Eosinophils Absolute: 0 10*3/uL (ref 0.0–0.5)
Eosinophils Absolute: 0 10*3/uL (ref 0.0–0.5)
Eosinophils Relative: 0 %
Eosinophils Relative: 0 %
HCT: 34.1 % — ABNORMAL LOW (ref 36.0–46.0)
HCT: 32.5 % — ABNORMAL LOW (ref 36.0–46.0)
Hemoglobin: 11.3 g/dL — ABNORMAL LOW (ref 12.0–15.0)
Hemoglobin: 11 g/dL — ABNORMAL LOW (ref 12.0–15.0)
Immature Granulocytes: 1 %
Immature Granulocytes: 1 %
Lymphocytes Relative: 2 %
Lymphocytes Relative: 2 %
Lymphs Abs: 0.2 10*3/uL — ABNORMAL LOW (ref 0.7–4.0)
Lymphs Abs: 0.2 10*3/uL — ABNORMAL LOW (ref 0.7–4.0)
MCH: 32.6 pg (ref 26.0–34.0)
MCH: 32.8 pg (ref 26.0–34.0)
MCHC: 33.1 g/dL (ref 30.0–36.0)
MCHC: 33.8 g/dL (ref 30.0–36.0)
MCV: 98.3 fL (ref 80.0–100.0)
MCV: 97 fL (ref 80.0–100.0)
Monocytes Absolute: 0.3 10*3/uL (ref 0.1–1.0)
Monocytes Absolute: 0.2 10*3/uL (ref 0.1–1.0)
Monocytes Relative: 3 %
Monocytes Relative: 3 %
Neutro Abs: 8.2 10*3/uL — ABNORMAL HIGH (ref 1.7–7.7)
Neutro Abs: 7.4 10*3/uL (ref 1.7–7.7)
Neutrophils Relative %: 94 %
Neutrophils Relative %: 94 %
Platelets: 150 10*3/uL (ref 150–400)
Platelets: 151 10*3/uL (ref 150–400)
RBC: 3.47 MIL/uL — ABNORMAL LOW (ref 3.87–5.11)
RBC: 3.35 MIL/uL — ABNORMAL LOW (ref 3.87–5.11)
RDW: 13.5 % (ref 11.5–15.5)
RDW: 13.6 % (ref 11.5–15.5)
WBC: 8.8 10*3/uL (ref 4.0–10.5)
WBC: 7.9 10*3/uL (ref 4.0–10.5)
nRBC: 0 % (ref 0.0–0.2)
nRBC: 0 % (ref 0.0–0.2)

## 2019-09-02 LAB — BLOOD GAS, ARTERIAL
Acid-base deficit: 2.7 mmol/L — ABNORMAL HIGH (ref 0.0–2.0)
Bicarbonate: 22.6 mmol/L (ref 20.0–28.0)
FIO2: 60
MECHVT: 500 mL
O2 Saturation: 95.7 %
PEEP: 10 cmH2O
Patient temperature: 37
RATE: 16 resp/min
pCO2 arterial: 40 mmHg (ref 32.0–48.0)
pH, Arterial: 7.36 (ref 7.350–7.450)
pO2, Arterial: 83 mmHg (ref 83.0–108.0)

## 2019-09-02 LAB — COMPREHENSIVE METABOLIC PANEL
ALT: 22 U/L (ref 0–44)
ALT: 23 U/L (ref 0–44)
AST: 50 U/L — ABNORMAL HIGH (ref 15–41)
AST: 45 U/L — ABNORMAL HIGH (ref 15–41)
Albumin: 2.6 g/dL — ABNORMAL LOW (ref 3.5–5.0)
Albumin: 2.6 g/dL — ABNORMAL LOW (ref 3.5–5.0)
Alkaline Phosphatase: 64 U/L (ref 38–126)
Alkaline Phosphatase: 68 U/L (ref 38–126)
Anion gap: 11 (ref 5–15)
Anion gap: 10 (ref 5–15)
BUN: 27 mg/dL — ABNORMAL HIGH (ref 8–23)
BUN: 30 mg/dL — ABNORMAL HIGH (ref 8–23)
CO2: 20 mmol/L — ABNORMAL LOW (ref 22–32)
CO2: 24 mmol/L (ref 22–32)
Calcium: 8.4 mg/dL — ABNORMAL LOW (ref 8.9–10.3)
Calcium: 8.1 mg/dL — ABNORMAL LOW (ref 8.9–10.3)
Chloride: 96 mmol/L — ABNORMAL LOW (ref 98–111)
Chloride: 92 mmol/L — ABNORMAL LOW (ref 98–111)
Creatinine, Ser: 1.25 mg/dL — ABNORMAL HIGH (ref 0.44–1.00)
Creatinine, Ser: 1.25 mg/dL — ABNORMAL HIGH (ref 0.44–1.00)
GFR calc Af Amer: 48 mL/min — ABNORMAL LOW (ref >60)
GFR calc Af Amer: 48 mL/min — ABNORMAL LOW (ref >60)
GFR calc non Af Amer: 41 mL/min — ABNORMAL LOW (ref >60)
GFR calc non Af Amer: 41 mL/min — ABNORMAL LOW (ref >60)
Glucose, Bld: 175 mg/dL — ABNORMAL HIGH (ref 70–99)
Glucose, Bld: 172 mg/dL — ABNORMAL HIGH (ref 70–99)
Potassium: 4.2 mmol/L (ref 3.5–5.1)
Potassium: 3.9 mmol/L (ref 3.5–5.1)
Sodium: 127 mmol/L — ABNORMAL LOW (ref 135–145)
Sodium: 126 mmol/L — ABNORMAL LOW (ref 135–145)
Total Bilirubin: 0.9 mg/dL (ref 0.3–1.2)
Total Bilirubin: 1.1 mg/dL (ref 0.3–1.2)
Total Protein: 5.7 g/dL — ABNORMAL LOW (ref 6.5–8.1)
Total Protein: 5.6 g/dL — ABNORMAL LOW (ref 6.5–8.1)

## 2019-09-02 LAB — TROPONIN I (HIGH SENSITIVITY): Troponin I (High Sensitivity): 45 ng/L — ABNORMAL HIGH (ref <18)

## 2019-09-02 LAB — POCT I-STAT 7, (LYTES, BLD GAS, ICA,H+H)
Acid-base deficit: 1 mmol/L (ref 0.0–2.0)
Bicarbonate: 24.6 mmol/L (ref 20.0–28.0)
Calcium, Ion: 1.2 mmol/L (ref 1.15–1.40)
HCT: 38 % (ref 36.0–46.0)
Hemoglobin: 12.9 g/dL (ref 12.0–15.0)
O2 Saturation: 100 %
Patient temperature: 96.6
Potassium: 4.2 mmol/L (ref 3.5–5.1)
Sodium: 125 mmol/L — ABNORMAL LOW (ref 135–145)
TCO2: 26 mmol/L (ref 22–32)
pCO2 arterial: 41.7 mmHg (ref 32.0–48.0)
pH, Arterial: 7.373 (ref 7.350–7.450)
pO2, Arterial: 217 mmHg — ABNORMAL HIGH (ref 83.0–108.0)

## 2019-09-02 LAB — PROTIME-INR
INR: 9.7 (ref 0.8–1.2)
Prothrombin Time: 78.5 seconds — ABNORMAL HIGH (ref 11.4–15.2)

## 2019-09-02 LAB — GLUCOSE, CAPILLARY
Glucose-Capillary: 160 mg/dL — ABNORMAL HIGH (ref 70–99)
Glucose-Capillary: 174 mg/dL — ABNORMAL HIGH (ref 70–99)

## 2019-09-02 LAB — LACTIC ACID, PLASMA: Lactic Acid, Venous: 1.7 mmol/L (ref 0.5–1.9)

## 2019-09-02 LAB — PHOSPHORUS: Phosphorus: 3.1 mg/dL (ref 2.5–4.6)

## 2019-09-02 LAB — FIBRIN DERIVATIVES D-DIMER (ARMC ONLY): Fibrin derivatives D-dimer (ARMC): 681.84 ng/mL (FEU) — ABNORMAL HIGH (ref 0.00–499.00)

## 2019-09-02 LAB — C-REACTIVE PROTEIN: CRP: 7.1 mg/dL — ABNORMAL HIGH (ref <1.0)

## 2019-09-02 LAB — FERRITIN: Ferritin: 213 ng/mL (ref 11–307)

## 2019-09-02 LAB — MRSA PCR SCREENING: MRSA by PCR: NEGATIVE

## 2019-09-02 LAB — MAGNESIUM
Magnesium: 1.8 mg/dL (ref 1.7–2.4)
Magnesium: 1.9 mg/dL (ref 1.7–2.4)

## 2019-09-02 MED ORDER — FENTANYL 2500MCG IN NS 250ML (10MCG/ML) PREMIX INFUSION
0.0000 ug/h | INTRAVENOUS | Status: DC
  Administered 2019-09-02: 11:00:00 100 ug/h via INTRAVENOUS

## 2019-09-02 MED ORDER — FENTANYL 2500MCG IN NS 250ML (10MCG/ML) PREMIX INFUSION
INTRAVENOUS | Status: AC
  Administered 2019-09-02: 11:00:00 100 ug/h via INTRAVENOUS
  Filled 2019-09-02: qty 250

## 2019-09-02 MED ORDER — MIDAZOLAM HCL 2 MG/2ML IJ SOLN
INTRAMUSCULAR | Status: AC
  Administered 2019-09-02: 4 mg via INTRAVENOUS
  Filled 2019-09-02: qty 4

## 2019-09-02 MED ORDER — STERILE WATER FOR INJECTION IJ SOLN
INTRAMUSCULAR | Status: AC
  Administered 2019-09-02: 10 mL
  Filled 2019-09-02: qty 10

## 2019-09-02 MED ORDER — CHLORHEXIDINE GLUCONATE 0.12% ORAL RINSE (MEDLINE KIT)
15.0000 mL | Freq: Two times a day (BID) | OROMUCOSAL | Status: DC
  Administered 2019-09-02: 15 mL via OROMUCOSAL

## 2019-09-02 MED ORDER — DEXAMETHASONE SODIUM PHOSPHATE 10 MG/ML IJ SOLN: 6.0000 mg | INTRAMUSCULAR | 0 refills | Status: AC

## 2019-09-02 MED ORDER — LEVOTHYROXINE SODIUM 50 MCG PO TABS
50.0000 ug | ORAL_TABLET | ORAL | Status: DC
  Administered 2019-09-04 – 2019-09-05 (×2): 50 ug via ORAL
  Filled 2019-09-02 (×2): qty 1

## 2019-09-02 MED ORDER — ORAL CARE MOUTH RINSE
15.0000 mL | OROMUCOSAL | Status: DC
  Administered 2019-09-02 – 2019-09-06 (×39): 15 mL via OROMUCOSAL

## 2019-09-02 MED ORDER — FENTANYL CITRATE (PF) 100 MCG/2ML IJ SOLN
INTRAMUSCULAR | Status: AC
  Administered 2019-09-02: 100 ug via INTRAVENOUS
  Filled 2019-09-02: qty 2

## 2019-09-02 MED ORDER — ONDANSETRON HCL 4 MG PO TABS: 4.0000 mg | ORAL_TABLET | Freq: Four times a day (QID) | ORAL | Status: DC | PRN

## 2019-09-02 MED ORDER — ONDANSETRON HCL 4 MG/2ML IJ SOLN: 4.0000 mg | Freq: Four times a day (QID) | INTRAMUSCULAR | Status: DC | PRN

## 2019-09-02 MED ORDER — AMIODARONE HCL IN DEXTROSE 360-4.14 MG/200ML-% IV SOLN
60.0000 mg/h | INTRAVENOUS | Status: DC
  Administered 2019-09-03 – 2019-09-04 (×5): 60 mg/h via INTRAVENOUS
  Filled 2019-09-02 (×5): qty 200

## 2019-09-02 MED ORDER — DEXAMETHASONE SODIUM PHOSPHATE 10 MG/ML IJ SOLN
6.0000 mg | INTRAMUSCULAR | Status: DC
  Administered 2019-09-02 – 2019-09-05 (×4): 6 mg via INTRAVENOUS
  Filled 2019-09-02 (×4): qty 1

## 2019-09-02 MED ORDER — FENTANYL 2500MCG IN NS 250ML (10MCG/ML) PREMIX INFUSION
0.0000 ug/h | INTRAVENOUS | Status: DC
  Administered 2019-09-02: 100 ug/h via INTRAVENOUS
  Filled 2019-09-02: qty 250

## 2019-09-02 MED ORDER — CHLORHEXIDINE GLUCONATE CLOTH 2 % EX PADS
6.0000 | MEDICATED_PAD | Freq: Every day | CUTANEOUS | Status: DC
  Administered 2019-09-02 – 2019-09-03 (×2): 6 via TOPICAL

## 2019-09-02 MED ORDER — MONTELUKAST SODIUM 10 MG PO TABS
10.0000 mg | ORAL_TABLET | Freq: Every day | ORAL | Status: DC
  Administered 2019-09-02 – 2019-09-05 (×4): 10 mg via ORAL
  Filled 2019-09-02 (×5): qty 1

## 2019-09-02 MED ORDER — PHYTONADIONE 5 MG PO TABS
10.0000 mg | ORAL_TABLET | Freq: Once | ORAL | Status: AC
  Administered 2019-09-02: 10 mg via ORAL
  Filled 2019-09-02: qty 2

## 2019-09-02 MED ORDER — ORAL CARE MOUTH RINSE: 15.0000 mL | OROMUCOSAL | Status: DC

## 2019-09-02 MED ORDER — AMIODARONE HCL IN DEXTROSE 360-4.14 MG/200ML-% IV SOLN: 30.0000 mg/h | INTRAVENOUS | Status: AC

## 2019-09-02 MED ORDER — MIDAZOLAM HCL 2 MG/2ML IJ SOLN
4.0000 mg | Freq: Once | INTRAMUSCULAR | Status: AC
  Administered 2019-09-02: 18:00:00 2 mg via INTRAVENOUS

## 2019-09-02 MED ORDER — MIDAZOLAM HCL 2 MG/2ML IJ SOLN
INTRAMUSCULAR | Status: AC
  Administered 2019-09-02: 2 mg via INTRAVENOUS
  Filled 2019-09-02: qty 4

## 2019-09-02 MED ORDER — ZINC SULFATE 220 (50 ZN) MG PO CAPS: 220.0000 mg | ORAL_CAPSULE | Freq: Every day | ORAL | Status: AC

## 2019-09-02 MED ORDER — VECURONIUM BROMIDE 10 MG IV SOLR
10.0000 mg | Freq: Once | INTRAVENOUS | Status: AC
  Administered 2019-09-02: 12:00:00 10 mg via INTRAVENOUS

## 2019-09-02 MED ORDER — ACETAMINOPHEN 650 MG RE SUPP: 650.0000 mg | Freq: Four times a day (QID) | RECTAL | Status: DC | PRN

## 2019-09-02 MED ORDER — ASCORBIC ACID 500 MG PO TABS: 500.0000 mg | ORAL_TABLET | Freq: Every day | ORAL | Status: AC

## 2019-09-02 MED ORDER — FENTANYL CITRATE (PF) 100 MCG/2ML IJ SOLN
100.0000 ug | Freq: Once | INTRAMUSCULAR | Status: AC
  Administered 2019-09-02: 12:00:00 100 ug via INTRAVENOUS

## 2019-09-02 MED ORDER — CHLORHEXIDINE GLUCONATE CLOTH 2 % EX PADS
6.0000 | MEDICATED_PAD | Freq: Every day | CUTANEOUS | Status: DC
  Administered 2019-09-02: 6 via TOPICAL

## 2019-09-02 MED ORDER — ORAL CARE MOUTH RINSE
15.0000 mL | OROMUCOSAL | Status: DC
  Administered 2019-09-02 (×4): 15 mL via OROMUCOSAL

## 2019-09-02 MED ORDER — ACETAMINOPHEN 325 MG PO TABS: 650.0000 mg | ORAL_TABLET | Freq: Four times a day (QID) | ORAL | Status: DC | PRN

## 2019-09-02 MED ORDER — AMIODARONE HCL IN DEXTROSE 360-4.14 MG/200ML-% IV SOLN
60.0000 mg/h | INTRAVENOUS | Status: DC
  Administered 2019-09-02: 60 mg/h via INTRAVENOUS
  Administered 2019-09-02: 30 mg/h via INTRAVENOUS
  Administered 2019-09-02: 60 mg/h via INTRAVENOUS
  Filled 2019-09-02 (×2): qty 200

## 2019-09-02 MED ORDER — MIDAZOLAM HCL 2 MG/2ML IJ SOLN
4.0000 mg | Freq: Once | INTRAMUSCULAR | Status: AC
  Administered 2019-09-02: 12:00:00 4 mg via INTRAVENOUS

## 2019-09-02 MED ORDER — CHLORHEXIDINE GLUCONATE 0.12% ORAL RINSE (MEDLINE KIT)
15.0000 mL | Freq: Two times a day (BID) | OROMUCOSAL | Status: DC
  Administered 2019-09-02 – 2019-09-03 (×2): 15 mL via OROMUCOSAL

## 2019-09-02 MED ORDER — AMIODARONE LOAD VIA INFUSION
150.0000 mg | Freq: Once | INTRAVENOUS | Status: AC
  Administered 2019-09-02: 150 mg via INTRAVENOUS

## 2019-09-02 MED ORDER — AMIODARONE HCL IN DEXTROSE 360-4.14 MG/200ML-% IV SOLN
60.0000 mg/h | INTRAVENOUS | Status: AC
  Administered 2019-09-02 (×2): 60 mg/h via INTRAVENOUS
  Filled 2019-09-02 (×2): qty 200

## 2019-09-02 MED ORDER — VECURONIUM BROMIDE 10 MG IV SOLR
INTRAVENOUS | Status: AC
  Administered 2019-09-02: 10 mg via INTRAVENOUS
  Filled 2019-09-02: qty 10

## 2019-09-02 NOTE — Progress Notes (Addendum)
Notified earlier in the evening at 2200 that patient was in Atrial fibrillation with RVR up to 120bpm. Pt is not on any rate control due to low blood pressure. She is anticoagulated on warfarin. Order for one time bolus of IV amiodarone given with improvement of her heart rate.  Now again with rates of 120-140. Will start on IV amiodarone gtt without bolus.  Continue to monitor on telemetry.

## 2019-09-02 NOTE — Discharge Summary (Signed)
[facility] [place] NAME: Maureen Wilson    MR#:  [id number]  DATE OF BIRTH:  1940/11/24  DATE OF ADMISSION:  08/29/2019   ADMITTING PHYSICIAN: Para Skeans, MD  DATE OF DISCHARGE: 08/26/2019  PRIMARY CARE PHYSICIAN: Phineas Inches, MD   ADMISSION DIAGNOSIS:  Major depressive disorder, recurrent, moderate (HCC) [F33.1] Weakness [X21.1] Chronic diastolic CHF (congestive heart failure) (HCC) [I50.32] Urinary tract infection without hematuria, site unspecified [N39.0] Poisoning by other opioids, accidental (unintentional), initial encounter ([place]) [T40.2X1A] Atrial fibrillation, unspecified type ([place]) [I48.91] Chronic obstructive pulmonary disease, unspecified COPD type ([place]) [J44.9] COVID-19 [U07.1] DISCHARGE DIAGNOSIS:  Principal Problem:   Acute and chronic respiratory failure (acute-on-chronic) (HCC) Active Problems:   Chronic atrial fibrillation (HCC)   Chronic diastolic CHF (congestive heart failure) (HCC)   Hypertension   COPD (chronic obstructive pulmonary disease) (HCC)   Generalized weakness  SECONDARY DIAGNOSIS:   Past Medical History:  Diagnosis Date   Asthma    Atrial fibrillation (HCC)    Back pain    Bronchitis    hx of   CHF (congestive heart failure) (HCC)    Chronic kidney disease    Chronic respiratory failure (HCC)    COPD (chronic obstructive pulmonary disease) (HCC)    Emphysema lung (HCC)    Hypertension    HOSPITAL COURSE:  Maureen Mowbray Wilkinsis a 78 y.o.femalewith medical history significant ofhigh blood pressure, chronic A. fib, COPD admitted for generalized weakness, nausea and vomiting  not feeling well since past Saturday. Patient reported that she had a cleaning lady who has been sick and has given her the Covid infection.   1. Acute hypoxic respiratory failure secondary to COVID 19 illness/pneumonia -chest x-ray with his bilateral opacities. Patient has underlying COPD -On  scheduled Robitussin, continued Tussionex as needed and cough lozenges. Oxygen requirements: 6-8LPM Antibiotics: Ceftriaxone for UTIcompleted. Diuretics: Currently on hold  Vitamin C and Zinc: Started on 08/30/2019 Remdesivir: Started on 08/29/2019 Steroids: Started on 08/30/2019. CRP 9.7--15.6--10.6  INR - 6.5-> 9.7 Coumadin is on hold since 11/21 and Vit K 10 mg ordered today  2. Chronic diastolic heart failure -patient on Lasix Aldactone and metolazone--holding at present -Does not appear to be in HF  3. Chronic atrial fibrillation HR went up (120-130s) last night and amio drip started on 11/24 night. INR - 6.5-> 9.7 Coumadin is on hold since 11/21 and Vit K 10 mg ordered today  4. History of hypertension with relative hypotension -holding BP meds  5. Hypothyroidism continue Synthroid  6. COPD -continue oral inhalers Singulair and Breo Ellipta   Family communication : Discussed with son Maureen Wilson over phone.  He is aware of her getting transferred to [facility] due to Covid pneumonia  DISCHARGE CONDITIONS:  Critical CONSULTS OBTAINED:   DRUG ALLERGIES:   Allergies  Allergen Reactions   Amlodipine Swelling and Other (See Comments)    Reaction:  Lip/mouth swelling    Hydralazine Swelling and Other (See Comments)    Reaction:  Lip/mouth swelling    Iohexol Hives and Other (See Comments)    Desc: Pt developed hives after receiving iv dye for ct scan.  suggest she be premedicated for future exams, Onset Date: [id number]    Metoprolol Swelling and Other (See Comments)    Reaction:  Lip/mouth swelling    Olmesartan Swelling and Other (See Comments)    Reaction:  Lip/mouth swelling    Other Swelling  Lip/mouth swelling   Ramipril Swelling and Other (See Comments)    Reaction:  Lip/mouth swelling    Risperidone Other (See Comments)   Sulfa Antibiotics Swelling and Other (See Comments)    Reaction:  Lip/mouth swelling  angioedema    Telmisartan Swelling and Other (See Comments)    Reaction:  Lip/mouth swelling    Tiotropium Swelling and Other (See Comments)    Reaction:  Lip/mouth swelling    DISCHARGE MEDICATIONS:   Allergies as of 09/12/2019      Reactions   Amlodipine Swelling, Other (See Comments)   Reaction:  Lip/mouth swelling    Hydralazine Swelling, Other (See Comments)   Reaction:  Lip/mouth swelling    Iohexol Hives, Other (See Comments)   Desc: Pt developed hives after receiving iv dye for ct scan.  suggest she be premedicated for future exams, Onset Date: [id number]   Metoprolol Swelling, Other (See Comments)   Reaction:  Lip/mouth swelling    Olmesartan Swelling, Other (See Comments)   Reaction:  Lip/mouth swelling    Other Swelling   Lip/mouth swelling   Ramipril Swelling, Other (See Comments)   Reaction:  Lip/mouth swelling    Risperidone Other (See Comments)   Sulfa Antibiotics Swelling, Other (See Comments)   Reaction:  Lip/mouth swelling  angioedema   Telmisartan Swelling, Other (See Comments)   Reaction:  Lip/mouth swelling    Tiotropium Swelling, Other (See Comments)   Reaction:  Lip/mouth swelling       Medication List    STOP taking these medications   diltiazem 120 MG 24 hr capsule Commonly known as: DILACOR XR   predniSONE 10 MG tablet Commonly known as: DELTASONE   predniSONE 5 MG tablet Commonly known as: DELTASONE     TAKE these medications   ALPRAZolam 0.25 MG tablet Commonly known as: XANAX Take 0.25 mg by mouth every 8 (eight) hours as needed for anxiety.   amiodarone 360-4.14 MG/200ML-% Soln Commonly known as: NEXTERONE PREMIX Inject 30 mg/hr into the vein continuous.   ascorbic acid 500 MG tablet Commonly known as: VITAMIN C Take 1 tablet (500 mg total) by mouth daily.   aspirin EC 81 MG tablet Take 81 mg by mouth daily.   atenolol 25 MG tablet Commonly known as: TENORMIN Take 25 mg by mouth daily.   atorvastatin 20 MG tablet Commonly known as:  LIPITOR Take 20 mg by mouth at bedtime.   Breo Ellipta 100-25 MCG/INH Aepb Generic drug: fluticasone furoate-vilanterol Inhale 1 puff into the lungs daily.   CENTRUM SILVER PO Take 1 tablet by mouth daily.   cholecalciferol 1000 units tablet Commonly known as: VITAMIN D Take 2,000 Units by mouth daily.   clonazePAM 1 MG tablet Commonly known as: KLONOPIN Take 0.5 mg by mouth 2 (two) times daily.   dexamethasone 10 MG/ML injection Commonly known as: DECADRON Inject 0.6 mLs (6 mg total) into the vein daily.   docusate sodium 100 MG capsule Commonly known as: COLACE Take 100 mg by mouth daily.   escitalopram 20 MG tablet Commonly known as: LEXAPRO Take 20 mg by mouth daily.   Esomeprazole Magnesium 20 MG Tbec Take 20 mg by mouth See admin instructions. Take 1 tablet ( ) by mouth daily and take 1 additional tablet ( ) by mouth daily as needed for heartburn/acid reflux symptoms   ferrous gluconate 240 (27 FE) MG tablet Commonly known as: FERGON Take 240 mg by mouth daily.   furosemide 40 MG tablet Commonly known as: LASIX Take 1 tablet (  40 mg total) by mouth every other day. What changed:   when to take this  additional instructions   Ipratropium-Albuterol 20-100 MCG/ACT Aers respimat Commonly known as: COMBIVENT Inhale 1 puff into the lungs 4 (four) times daily as needed for wheezing or shortness of breath.   levothyroxine 50 MCG tablet Commonly known as: SYNTHROID Take 50-75 mcg by mouth See admin instructions. Take 1 tablet ( ) by mouth every Tuesday, Wednesday, Friday, Saturday and Sunday morning and take 1 tablets ( ) by mouth every Monday and Thursday morning   metolazone 2.5 MG tablet Commonly known as: ZAROXOLYN Take 2.5 mg by mouth See admin instructions. Take 1 tablet (2.5mg ) by mouth every Monday, Thursday and Saturday morning   montelukast 10 MG tablet Commonly known as: SINGULAIR Take 10 mg by mouth at bedtime.   potassium chloride  SA 20 MEQ tablet Commonly known as: KLOR-CON Take 1 tablet (20 mEq total) by mouth every other day. What changed:   how much to take  when to take this  additional instructions   spironolactone 25 MG tablet Commonly known as: ALDACTONE Take 25 mg by mouth See admin instructions. Take 1 tablet ( ) by mouth every Monday and Thursday morning   tamsulosin 0.4 MG Caps capsule Commonly known as: FLOMAX Take 0.4 mg by mouth daily.   warfarin 2 MG tablet Commonly known as: COUMADIN Take 1 mg by mouth every evening.   zinc sulfate 220 (50 Zn) MG capsule Take 1 capsule (220 mg total) by mouth daily.      DISCHARGE INSTRUCTIONS:   DIET:  Cardiac diet DISCHARGE CONDITION:  Critical ACTIVITY:  Activity as tolerated OXYGEN:  Home Oxygen: Yes.    Oxygen Delivery: 6-8 liters/min via Patient connected to nasal cannula oxygen DISCHARGE LOCATION:  [facility]  If you experience worsening of your admission symptoms, develop shortness of breath, life threatening emergency, suicidal or homicidal thoughts you must seek medical attention immediately by calling 911 or calling your MD immediately  if symptoms less severe.  You Must read complete instructions/literature along with all the possible adverse reactions/side effects for all the Medicines you take and that have been prescribed to you. Take any new Medicines after you have completely understood and accpet all the possible adverse reactions/side effects.   Please note  You were cared for by a hospitalist during your hospital stay. If you have any questions about your discharge medications or the care you received while you were in the hospital after you are discharged, you can call the unit and asked to speak with the hospitalist on call if the hospitalist that took care of you is not available. Once you are discharged, your primary care physician will handle any further medical issues. Please note that NO REFILLS for any  discharge medications will be authorized once you are discharged, as it is imperative that you return to your primary care physician (or establish a relationship with a primary care physician if you do not have one) for your aftercare needs so that they can reassess your need for medications and monitor your lab values.    On the day of Discharge:  VITAL SIGNS:  Blood pressure (!) 137/92, pulse (!) 112, temperature 98.4 F (36.9 C), temperature source Oral, resp. rate (!) 23, height  (1.6 m), weight 89.1 kg, SpO2 93 %. PHYSICAL EXAMINATION:  GENERAL:  78 y.o.-year-old patient lying in the bed with no acute distress.  EYES: Pupils equal, round, reactive to light and accommodation. No  scleral icterus. Extraocular muscles intact.  HEENT: Head atraumatic, normocephalic. Oropharynx and nasopharynx clear.  NECK:  Supple, no jugular venous distention. No thyroid enlargement, no tenderness.  LUNGS: Normal breath sounds bilaterally, no wheezing, rales,rhonchi or crepitation. No use of accessory muscles of respiration.  CARDIOVASCULAR: S1, S2 normal. No murmurs, rubs, or gallops.  ABDOMEN: Soft, non-tender, non-distended. Bowel sounds present. No organomegaly or mass.  EXTREMITIES: No pedal edema, cyanosis, or clubbing.  NEUROLOGIC: Cranial nerves II through XII are intact. Muscle strength 5/5 in all extremities. Sensation intact. Gait not checked.  PSYCHIATRIC: The patient is alert and oriented x 3.  SKIN: No obvious rash, lesion, or ulcer.  DATA REVIEW:   CBC Recent Labs  Lab 08/25/2019 0414  WBC 8.8  HGB 11.3*  HCT 34.1*  PLT 150    Chemistries  Recent Labs  Lab 08/25/2019 0414  NA 127*  K 4.2  CL 96*  CO2 20*  GLUCOSE 175*  BUN 27*  CREATININE 1.25*  CALCIUM 8.4*  MG 1.8  AST 50*  ALT 22  ALKPHOS 64  BILITOT 0.9     Microbiology Results  Results for orders placed or performed during the hospital encounter of 08/29/19  Culture, blood (routine x 2)     Status: None  (Preliminary result)   Collection Time: 08/29/19  5:06 AM   Specimen: BLOOD  Result Value Ref Range Status   Specimen Description BLOOD LEFT FOREARM  Final   Special Requests   Final    BOTTLES DRAWN AEROBIC AND ANAEROBIC Blood Culture adequate volume   Culture   Final    NO GROWTH 4 DAYS Performed at [facility], [address] [place], [place] [address]    Report Status PENDING  Incomplete  Culture, blood (routine x 2)     Status: None (Preliminary result)   Collection Time: 08/29/19  5:06 AM   Specimen: BLOOD  Result Value Ref Range Status   Specimen Description BLOOD RIGHT ARM  Final   Special Requests   Final    BOTTLES DRAWN AEROBIC AND ANAEROBIC Blood Culture adequate volume   Culture   Final    NO GROWTH 4 DAYS Performed at [facility], [address] [place], [place] [address]    Report Status PENDING  Incomplete  Urine culture     Status: Abnormal   Collection Time: 08/29/19  5:06 AM   Specimen: Urine, Catheterized  Result Value Ref Range Status   Specimen Description   Final    URINE, CATHETERIZED Performed at [facility], [address], [place], [place] [address]    Special Requests   Final    NONE Performed at [facility], [address] [place], [place] [address]    Culture >=100,000 COLONIES/mL ESCHERICHIA COLI (A)  Final   Report Status 08/31/2019 FINAL  Final   Organism ID, Bacteria ESCHERICHIA COLI (A)  Final      Susceptibility   Escherichia coli - MIC*    AMPICILLIN >=32 RESISTANT Resistant     CEFAZOLIN <=4 SENSITIVE Sensitive     CEFTRIAXONE <=1 SENSITIVE Sensitive     CIPROFLOXACIN >=4 RESISTANT Resistant     GENTAMICIN <=1 SENSITIVE Sensitive     IMIPENEM <=0.25 SENSITIVE Sensitive     NITROFURANTOIN <=16 SENSITIVE Sensitive     TRIMETH/SULFA <=20 SENSITIVE Sensitive     AMPICILLIN/SULBACTAM >=32 RESISTANT Resistant     PIP/TAZO <=4 SENSITIVE Sensitive     Extended ESBL NEGATIVE Sensitive     *  >=  100,000 COLONIES/mL ESCHERICHIA COLI  SARS Coronavirus 2 by RT PCR (hospital order, [facility]) Nasopharyngeal Nasopharyngeal Swab     Status: Abnormal   Collection Time: 08/29/19  5:06 AM   Specimen: Nasopharyngeal Swab  Result Value Ref Range Status   SARS Coronavirus 2 POSITIVE (A) NEGATIVE Final    Comment: RESULT CALLED TO, READ BACK BY AND VERIFIED WITH: BRANDY DAVIS @0813  ON 08/29/2019 BY FMW (NOTE) If result is NEGATIVE SARS-CoV-2 target nucleic acids are NOT DETECTED. The SARS-CoV-2 RNA is generally detectable in upper and lower  respiratory specimens during the acute phase of infection. The lowest  concentration of SARS-CoV-2 viral copies this assay can detect is 250  copies / mL. A negative result does not preclude SARS-CoV-2 infection  and should not be used as the sole basis for treatment or other  patient management decisions.  A negative result may occur with  improper specimen collection / handling, submission of specimen other  than nasopharyngeal swab, presence of viral mutation(s) within the  areas targeted by this assay, and inadequate number of viral copies  (<250 copies / mL). A negative result must be combined with clinical  observations, patient history, and epidemiological information. If result is POSITIVE SARS-CoV-2 target nucleic acids are DETECTED . The SARS-CoV-2 RNA is generally detectable in upper and lower  respiratory specimens during the acute phase of infection.  Positive  results are indicative of active infection with SARS-CoV-2.  Clinical  correlation with patient history and other diagnostic information is  necessary to determine patient infection status.  Positive results do  not rule out bacterial infection or co-infection with other viruses. If result is PRESUMPTIVE POSTIVE SARS-CoV-2 nucleic acids MAY BE PRESENT.   A presumptive positive result was obtained on the submitted specimen  and confirmed on repeat  testing.  While 2019 novel coronavirus  (SARS-CoV-2) nucleic acids may be present in the submitted sample  additional confirmatory testing may be necessary for epidemiological  and / or clinical management purposes  to differentiate between  SARS-CoV-2 and other Sarbecovirus currently known to infect humans.  If clinically indicated additional testing with an alternate test  methodology [phone]([id number] ) is advised. The SARS-CoV-2 RNA is generally  detectable in upper and lower respiratory specimens during the acute  phase of infection. The expected result is Negative. Fact Sheet for Patients:  BoilerBrush.com.cyhttps://www.fda.gov/media/136312/download Fact Sheet for Healthcare Providers: https://pope.com/https://www.fda.gov/media/136313/download This test is not yet approved or cleared by the [place] FDA and has been authorized for detection and/or diagnosis of SARS-CoV-2 by FDA under an Emergency Use Authorization (EUA).  This EUA will remain in effect (meaning this test can be used) for the duration of the COVID-19 declaration under Section 564(b)(1) of the Act, 21 U.S.C. section 360bbb-3(b)(1), unless the authorization is terminated or revoked sooner. Performed at [facility], [address] [place], [place] [address]     RADIOLOGY:  No results found.   Management plans discussed with the patient, family and they are in agreement.  CODE STATUS: Full Code   TOTAL TIME TAKING CARE OF THIS PATIENT: 45 minutes.    Delfino LovettVipul Deaundra Kutzer M.D on 22-May-2019 at 8:11 AM  Between 7am to 6pm - Pager - [phone]  After 6pm go to [email or web address] - password [email or web address]  [facility]   CC: Primary care physician; Joanna Hewsate, Allen D, MD   Note: This dictation was prepared with Dragon dictation along with smaller phrase technology. Any transcriptional errors that result from this process  are unintentional.

## 2019-09-02 NOTE — Consult Note (Signed)
CRITICAL CARE NOTE  CC  SEVERE RESP FAILURE COVID 19 infection pneumonia  HPI  PER REPORT 78 y.o. female with medical history significant of high blood pressure, chronic A. fib, COPD seen in the emergency room today for generalized weakness not feeling well since past Saturday.  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  Patient came to the emergency room via EMS for this weakness and nausea and vomiting.  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  Patient lives home with spouse and she is usually able to cook their meals and takes care of him and herself. Pt was diagnosed with Covid-19 Infection on Wednesday and has been progressively weak and fatigued.  ED Course:  Blood pressure 111/62, pulse 92, temperature 100 F (37.8 C), temperature source Core, resp. rate (!) 21, height  (1.6 m), weight 92.1 kg, SpO2 97 %. Patient in the ED is alert awake oriented denies any complete pulse oximetry is 97% on 2 L and patient is comfortable on this.  Patient found to have Covid positive rapid in the emergency room as well patient started on remdesivir, Zofran for her nausea.  Results show hyponatremia and hypochloremia reflecting her dehydration.  Patient's creatinine is 1.31 which is her baseline from her previous admissions, serum albumin level is mildly low.  AST ALT are within normal limits. Pt had chest xray which shows bl opacities consistent with Covid-19 pneumonia infection.  Patient transferred to ICU for severe resp failure Patient is dying from OCIVD 19 infection and pneumonai  She has end stage COPD and dCHF  I am trying to call family and awaiting for  Their response    BP (!) 137/92   Pulse (!) 112   Temp 98.4 F (36.9 C) (Oral)   Resp (!) 23   Ht  (1.6 m)   Wt 89.1 kg   SpO2 (!) 84%   BMI 34.80 kg/m    I/O last 3 completed shifts: In: 250 [IV Piggyback:250] Out: 810 [Urine:810] No  intake/output data recorded.  SpO2: (!) 84 % O2 Flow Rate (L/min): 7 L/min   SIGNIFICANT EVENTS   REVIEW OF SYSTEMS  PATIENT IS UNABLE TO PROVIDE COMPLETE REVIEW OF SYSTEMS DUE TO SEVERE CRITICAL ILLNESS    COVID-19 DISASTER DECLARATION:   FULL CONTACT PHYSICAL EXAMINATION WAS NOT POSSIBLE DUE TO TREATMENT OF COVID-19 AND   CONSERVATION OF PERSONAL PROTECTIVE EQUIPMENT, LIMITED EXAM FINDINGS INCLUDE-   Patient assessed or the symptoms described in the history of present illness.   In the context of the Global COVID-19 pandemic, which necessitated consideration that the patient might be at risk for infection with the SARS-CoV-2 virus that causes COVID-19, Institutional protocols and algorithms that pertain to the evaluation of patients at risk for COVID-19 are in a state of rapid change based on information released by regulatory bodies including the CDC and federal and state organizations. These policies and algorithms were followed during the patient's care while in hospital.     MEDICATIONS: I have reviewed all medications and confirmed regimen as documented   CULTURE RESULTS   Recent Results (from the past 240 hour(s))  Culture, blood (routine x 2)     Status: None (Preliminary result)   Collection Time: 08/29/19  5:06 AM   Specimen: BLOOD  Result Value Ref Range Status   Specimen Description BLOOD LEFT FOREARM  Final   Special Requests  Final    BOTTLES DRAWN AEROBIC AND ANAEROBIC Blood Culture adequate volume   Culture   Final    NO GROWTH 4 DAYS Performed at [facility], [address] [place], [place] [address]    Report Status PENDING  Incomplete  Culture, blood (routine x 2)     Status: None (Preliminary result)   Collection Time: 08/29/19  5:06 AM   Specimen: BLOOD  Result Value Ref Range Status   Specimen Description BLOOD RIGHT ARM  Final   Special Requests   Final    BOTTLES DRAWN AEROBIC AND ANAEROBIC Blood Culture adequate volume    Culture   Final    NO GROWTH 4 DAYS Performed at [facility], [address] [place], [place] [address]    Report Status PENDING  Incomplete  Urine culture     Status: Abnormal   Collection Time: 08/29/19  5:06 AM   Specimen: Urine, Catheterized  Result Value Ref Range Status   Specimen Description   Final    URINE, CATHETERIZED Performed at [facility], [address] [place], [place] [address]    Special Requests   Final    NONE Performed at [facility], [address] [place], [place] [address]    Culture >=100,000 COLONIES/mL ESCHERICHIA COLI (A)  Final   Report Status 08/31/2019 FINAL  Final   Organism ID, Bacteria ESCHERICHIA COLI (A)  Final      Susceptibility   Escherichia coli - MIC*    AMPICILLIN >=32 RESISTANT Resistant     CEFAZOLIN <=4 SENSITIVE Sensitive     CEFTRIAXONE <=1 SENSITIVE Sensitive     CIPROFLOXACIN >=4 RESISTANT Resistant     GENTAMICIN <=1 SENSITIVE Sensitive     IMIPENEM <=0.25 SENSITIVE Sensitive     NITROFURANTOIN <=16 SENSITIVE Sensitive     TRIMETH/SULFA <=20 SENSITIVE Sensitive     AMPICILLIN/SULBACTAM >=32 RESISTANT Resistant     PIP/TAZO <=4 SENSITIVE Sensitive     Extended ESBL NEGATIVE Sensitive     * >=100,000 COLONIES/mL ESCHERICHIA COLI  SARS Coronavirus 2 by RT PCR (hospital order, performed in [facility] hospital lab) Nasopharyngeal Nasopharyngeal Swab     Status: Abnormal   Collection Time: 08/29/19  5:06 AM   Specimen: Nasopharyngeal Swab  Result Value Ref Range Status   SARS Coronavirus 2 POSITIVE (A) NEGATIVE Final    Comment: RESULT CALLED TO, READ BACK BY AND VERIFIED WITH: BRANDY DAVIS @0813  ON 08/29/2019 BY FMW (NOTE) If result is NEGATIVE SARS-CoV-2 target nucleic acids are NOT DETECTED. The SARS-CoV-2 RNA is generally detectable in upper and lower  respiratory specimens during the acute phase of infection. The lowest  concentration of SARS-CoV-2 viral copies this assay can detect is  250  copies / mL. A negative result does not preclude SARS-CoV-2 infection  and should not be used as the sole basis for treatment or other  patient management decisions.  A negative result may occur with  improper specimen collection / handling, submission of specimen other  than nasopharyngeal swab, presence of viral mutation(s) within the  areas targeted by this assay, and inadequate number of viral copies  (<250 copies / mL). A negative result must be combined with clinical  observations, patient history, and epidemiological information. If result is POSITIVE SARS-CoV-2 target nucleic acids are DETECTED . The SARS-CoV-2 RNA is generally detectable in upper and lower  respiratory specimens during the acute phase of infection.  Positive  results are indicative of active infection with  SARS-CoV-2.  Clinical  correlation with patient history and other diagnostic information is  necessary to determine patient infection status.  Positive results do  not rule out bacterial infection or co-infection with other viruses. If result is PRESUMPTIVE POSTIVE SARS-CoV-2 nucleic acids MAY BE PRESENT.   A presumptive positive result was obtained on the submitted specimen  and confirmed on repeat testing.  While 2019 novel coronavirus  (SARS-CoV-2) nucleic acids may be present in the submitted sample  additional confirmatory testing may be necessary for epidemiological  and / or clinical management purposes  to differentiate between  SARS-CoV-2 and other Sarbecovirus currently known to infect humans.  If clinically indicated additional testing with an alternate test  methodology [phone] is advised. The SARS-CoV-2 RNA is generally  detectable in upper and lower respiratory specimens during the acute  phase of infection. The expected result is Negative. Fact Sheet for Patients:  BoilerBrush.com.cyhttps://www.fda.gov/media/136312/download Fact Sheet for Healthcare  Providers: https://pope.com/https://www.fda.gov/media/136313/download This test is not yet approved or cleared by the [place] FDA and has been authorized for detection and/or diagnosis of SARS-CoV-2 by FDA under an Emergency Use Authorization (EUA).  This EUA will remain in effect (meaning this test can be used) for the duration of the COVID-19 declaration under Section 564(b)(1) of the Act, 21 U.S.C. section 360bbb-3(b)(1), unless the authorization is terminated or revoked sooner. Performed at [facility], [address] [place], [place] [address]      CBC    Component Value Date/Time   WBC 8.8 08/19/2019 0414   RBC 3.47 (L) 08/28/2019 0414   HGB 11.3 (L) 08/19/2019 0414   HGB 13.3 06/26/2014 0820   HCT 34.1 (L) 08/24/2019 0414   HCT 40.3 06/26/2014 0820   PLT 150 08/23/2019 0414   PLT 190 06/26/2014 0820   MCV 98.3 08/27/2019 0414   MCV 99 06/26/2014 0820   MCH 32.6 08/12/2019 0414   MCHC 33.1 09/04/2019 0414   RDW 13.5 08/13/2019 0414   RDW 14.7 (H) 06/26/2014 0820   LYMPHSABS 0.2 (L) 08/19/2019 0414   LYMPHSABS 0.8 (L) 11/02/2013 0636   MONOABS 0.3 08/12/2019 0414   MONOABS 0.7 11/02/2013 0636   EOSABS 0.0 08/18/2019 0414   EOSABS 0.0 11/02/2013 0636   BASOSABS 0.0 08/12/2019 0414   BASOSABS 0.1 11/02/2013 0636       BMP Latest Ref Rng & Units 08/27/2019 09/01/2019 08/31/2019  Glucose 70 - 99 mg/dL 811(B175(H) 147(W172(H) 295(A143(H)  BUN 8 - 23 mg/dL 21(H27(H) 08(M27(H) 57(Q26(H)  Creatinine 0.44 - 1.00 mg/dL 4.69(G1.25(H) 2.95(M1.23(H) 8.41(L1.42(H)  Sodium 135 - 145 mmol/L 127(L) 128(L) 130(L)  Potassium 3.5 - 5.1 mmol/L 4.2 4.3 4.2  Chloride 98 - 111 mmol/L 96(L) 95(L) 92(L)  CO2 22 - 32 mmol/L 20(L) 21(L) 22  Calcium 8.9 - 10.3 mg/dL 2.4(M8.4(L) 8.1(L) 8.5(L)        ASSESSMENT AND PLAN SYNOPSIS  78 yo white female with severe resp failure from severe COVID 19 infection and pneumonia with end stage COPD and dCHF with progressive metabolic encephlopathy  Severe ACUTE Hypoxic and Hypercapnic  Respiratory Failure Patient with very poor prognosis and patient is dying I recommend contacting family ASAP to assess CODE status  ACUTE DIASTOLIC CARDIAC FAILURE- EF -oxygen as needed -Lasix as tolerated   SEVERE COPD EXACERBATION -continue IV steroids as prescribed   ACUTE KIDNEY INJURY/Renal Failure -follow chem 7 -follow UO -continue Foley Catheter-assess need -Avoid nephrotoxic agents -Recheck creatinine    NEUROLOGY encephalopathy   CARDIAC ICU monitoring  ID  COVID 19 infection Completed Remdisivir    ELECTROLYTES -follow labs as needed -replace as needed -pharmacy consultation and following   DVT/GI PRX ordered TRANSFUSIONS AS NEEDED MONITOR FSBS ASSESS the need for LABS as needed   Critical Care Time devoted to patient care services described in this note is 46 minutes.   Overall, patient is critically ill, prognosis is guarded.  Patient with Multiorgan failure and at high risk for cardiac arrest and death.  Patient is dying from [place] infection  Cheronda Erck Anshika Pesa, M.D.  [facility]  Medical Director [place] Director [facility] Cardio-Pulmonary Department

## 2019-09-02 NOTE — Consult Note (Addendum)
ANTICOAGULATION CONSULT NOTE   Pharmacy Consult for Warfarin Indication: atrial fibrillation  Allergies  Allergen Reactions  . Amlodipine Swelling and Other (See Comments)    Reaction:  Lip/mouth swelling   . Hydralazine Swelling and Other (See Comments)    Reaction:  Lip/mouth swelling   . Iohexol Hives and Other (See Comments)    Desc: Pt developed hives after receiving iv dye for ct scan.  suggest she be premedicated for future exams, Onset Date: [id number]   . Metoprolol Swelling and Other (See Comments)    Reaction:  Lip/mouth swelling   . Olmesartan Swelling and Other (See Comments)    Reaction:  Lip/mouth swelling   . Other Swelling    Lip/mouth swelling  . Ramipril Swelling and Other (See Comments)    Reaction:  Lip/mouth swelling   . Risperidone Other (See Comments)  . Sulfa Antibiotics Swelling and Other (See Comments)    Reaction:  Lip/mouth swelling  angioedema  . Telmisartan Swelling and Other (See Comments)    Reaction:  Lip/mouth swelling   . Tiotropium Swelling and Other (See Comments)    Reaction:  Lip/mouth swelling     Patient Measurements: Height: 5\' 3"  (160 cm) Weight: 196 lb 6.9 oz (89.1 kg) IBW/kg (Calculated) : 52.4  Vital Signs: Temp: 97.9 F (36.6 C) (11/25 0600) Temp Source: Oral (11/25 0600) BP: 112/81 (11/25 0700) Pulse Rate: 116 (11/25 0700)  Labs: Recent Labs    08/31/19 0416 08/31/19 1750 09/01/19 0705 09/21/19 0414  HGB 11.6*  --  11.6* 11.3*  HCT 33.1*  --  33.2* 34.1*  PLT 108*  --  130* 150  LABPROT 51.4*  --  57.5* 78.5*  INR 5.7*  --  6.5* 9.7*  CREATININE 1.21* 1.42* 1.23* 1.25*    Estimated Creatinine Clearance: 39.3 mL/min (A) (by C-G formula based on SCr of 1.25 mg/dL (H)).   Medical History: Past Medical History:  Diagnosis Date  . Asthma   . Atrial fibrillation (HCC)   . Back pain   . Bronchitis    hx of  . CHF (congestive heart failure) (HCC)   . Chronic kidney disease   . Chronic respiratory failure  (HCC)   . COPD (chronic obstructive pulmonary disease) (HCC)   . Emphysema lung (HCC)   . Hypertension     Medications:  Medications Prior to Admission  Medication Sig Dispense Refill Last Dose  . ALPRAZolam (XANAX) 0.25 MG tablet Take 0.25 mg by mouth every 8 (eight) hours as needed for anxiety.    Unknown at PRN  . aspirin EC 81 MG tablet Take 81 mg by mouth daily.   08/28/2019 at 0800  . atenolol (TENORMIN) 25 MG tablet Take 25 mg by mouth daily.    08/28/2019 at 0800  . atorvastatin (LIPITOR) 20 MG tablet Take 20 mg by mouth at bedtime.    08/28/2019 at 2000  . cholecalciferol (VITAMIN D) 1000 units tablet Take 2,000 Units by mouth daily.   08/28/2019 at 0800  . clonazePAM (KLONOPIN) 1 MG tablet Take 0.5 mg by mouth 2 (two) times daily.    08/28/2019 at 2000  . diltiazem (DILACOR XR) 120 MG 24 hr capsule Take 120 mg by mouth daily.    08/28/2019 at 0800  . docusate sodium (COLACE) 100 MG capsule Take 100 mg by mouth daily.   08/28/2019 at 0800  . escitalopram (LEXAPRO) 20 MG tablet Take 20 mg by mouth daily.   08/28/2019 at 0800  . Esomeprazole Magnesium 20 MG  TBEC Take 20 mg by mouth See admin instructions. Take 1 tablet (20mg ) by mouth daily and take 1 additional tablet (20mg ) by mouth daily as needed for heartburn/acid reflux symptoms   08/28/2019 at 0800  . ferrous gluconate (FERGON) 240 (27 FE) MG tablet Take 240 mg by mouth daily.   08/28/2019 at 0800  . fluticasone furoate-vilanterol (BREO ELLIPTA) 100-25 MCG/INH AEPB Inhale 1 puff into the lungs daily.   08/28/2019 at 1300  . furosemide (LASIX) 40 MG tablet Take 1 tablet (40 mg total) by mouth every other day. (Patient taking differently: Take 40 mg by mouth See admin instructions. Take 1 tablet (40mg ) by mouth every Sunday, Tuesday, Thursday and Saturday morning) 30 tablet 0 08/28/2019 at 0800  . Ipratropium-Albuterol (COMBIVENT) 20-100 MCG/ACT AERS respimat Inhale 1 puff into the lungs 4 (four) times daily as needed for wheezing  or shortness of breath.    Unknown at PRN  . levothyroxine (SYNTHROID, LEVOTHROID) 50 MCG tablet Take 50-75 mcg by mouth See admin instructions. Take 1 tablet (33mcg) by mouth every Tuesday, Wednesday, Friday, Saturday and Sunday morning and take 1 tablets (102mcg) by mouth every Monday and Thursday morning   08/28/2019 at 0700  . metolazone (ZAROXOLYN) 2.5 MG tablet Take 2.5 mg by mouth See admin instructions. Take 1 tablet (2.5mg ) by mouth every Monday, Thursday and Saturday morning   08/27/2019 at 0800  . montelukast (SINGULAIR) 10 MG tablet Take 10 mg by mouth at bedtime.   08/28/2019 at 2000  . Multiple Vitamins-Minerals (CENTRUM SILVER PO) Take 1 tablet by mouth daily.   08/28/2019 at 0800  . potassium chloride SA (K-DUR,KLOR-CON) 20 MEQ tablet Take 1 tablet (20 mEq total) by mouth every other day. (Patient taking differently: Take 10-20 mEq by mouth See admin instructions. Take  tablet (27meq) by mouth every morning and take 1 tablet (3meq) by mouth every evening) 30 tablet 0 08/28/2019 at 2000  . predniSONE (DELTASONE) 5 MG tablet Take 5 mg by mouth See admin instructions. Take 1 tablet (5mg ) by mouth every Sunday, Tuesday, Thursday and Saturday morning   08/28/2019 at 0800  . spironolactone (ALDACTONE) 25 MG tablet Take 25 mg by mouth See admin instructions. Take 1 tablet (25mg ) by mouth every Monday and Thursday morning   08/27/2019 at 0800  . tamsulosin (FLOMAX) 0.4 MG CAPS capsule Take 0.4 mg by mouth daily.    08/28/2019 at 0800  . warfarin (COUMADIN) 2 MG tablet Take 1 mg by mouth every evening.    08/28/2019 at 2000  . predniSONE (DELTASONE) 10 MG tablet 50 mg daily x 2 days, then 40 mg daily x 2 days, then 30 mg daily x 2 days, then 20 mg daily x 2 days, then 10 mg daily x 2 days. (Patient not taking: Reported on 08/29/2019) 30 tablet 0 Not Taking at Unknown time   Scheduled:  . aspirin EC  81 mg Oral Daily  . atorvastatin  20 mg Oral QHS  . benzonatate  200 mg Oral BID  .  dexamethasone (DECADRON) injection  6 mg Intravenous Q24H  . escitalopram  20 mg Oral Daily  . ferrous sulfate  325 mg Oral Q24H  . fluticasone furoate-vilanterol  1 puff Inhalation Daily  . guaiFENesin  5 mL Oral BID  . levothyroxine  50 mcg Oral Once per day on Sun Tue Wed Fri Sat  . levothyroxine  75 mcg Oral Once per day on Mon Thu  . menthol-cetylpyridinium  1 lozenge Oral QID  .  montelukast  10 mg Oral QHS  . nystatin  5 mL Oral QID  . pantoprazole  40 mg Oral Daily  . sodium chloride flush  3 mL Intravenous Q12H  . tamsulosin  0.4 mg Oral Daily  . vitamin C  500 mg Oral Daily  . Warfarin - Pharmacist Dosing Inpatient   Does not apply q1800  . zinc sulfate  220 mg Oral Daily   Infusions:  . amiodarone 60 mg/hr (08/14/2019 0726)   Followed by  . amiodarone    . remdesivir 100 mg in NS 250 mL Stopped (09/01/19 1045)   PRN: acetaminophen, ALPRAZolam, bisacodyl, chlorpheniramine-HYDROcodone, guaiFENesin-dextromethorphan, Ipratropium-Albuterol, ondansetron (ZOFRAN) IV, oxyCODONE, sodium chloride, sodium chloride flush Anti-infectives (From admission, onward)   Start     Dose/Rate Route Frequency Ordered Stop   08/30/19 0800  remdesivir 100 mg in sodium chloride 0.9 % 250 mL IVPB     100 mg 500 mL/hr over 30 Minutes Intravenous Daily 08/29/19 0852 09/03/19 0759   08/30/19 0800  cefTRIAXone (ROCEPHIN) 1 g in sodium chloride 0.9 % 100 mL IVPB  Status:  Discontinued     1 g 200 mL/hr over 30 Minutes Intravenous Every 24 hours 08/29/19 1358 08/31/19 1258   08/29/19 1400  cefTRIAXone (ROCEPHIN) 1 g in sodium chloride 0.9 % 100 mL IVPB  Status:  Discontinued     1 g 200 mL/hr over 30 Minutes Intravenous Daily 08/29/19 1224 08/29/19 1357   08/29/19 0900  remdesivir 200 mg in sodium chloride 0.9 % 250 mL IVPB     200 mg 500 mL/hr over 30 Minutes Intravenous Once 08/29/19 0852 08/29/19 1235   08/29/19 0600  cefTRIAXone (ROCEPHIN) 1 g in sodium chloride 0.9 % 100 mL IVPB     1 g 200 mL/hr  over 30 Minutes Intravenous  Once 08/29/19 0557 08/29/19 [id number]      Assessment: Pharmacy consulted to resume warfarin in the patient for afib. INR on admission was 7.1.    Home dose : warfarin 1 mg daily.   Date INR Warfarin Dose  11/23 5.7 HOLD  11/24 6.5 HOLD  11/25 9.7 HOLD gave vitamin K 10 mg PO    Goal of Therapy:  INR 2-3 Monitor platelets by anticoagulation protocol: Yes   Plan:  INR supratherapeutic. Will continue to hold warfarin until INR < 3. Monitor CBC q3days. Pt started on amiodarone gtt due to afib last night (11/24). Daily INR ordered. Pharmacy will continue to follow. Abx d/c'ed. Will recommend to give vitamin K. If pt remains on amiodarone at discharge, warfarin dose will need to be cut in half. Unsure why INR is continuing to trend up, MD aware.  Ronnald RampKishan S Shayde Gervacio, PharmD, BCPS 08/18/2019,7:39 AM

## 2019-09-02 NOTE — Progress Notes (Signed)
Patient was a rapid response from the floor- patient was tachypneic in 30's and stated that she was having a difficult time breathing- pt was alert and oriented and stated that she would like to be placed on the ventilator.  Patient was intubated- patient comfortable on fentanyl drip- follows commands and says she is not in pain.  [facility] just came and transferred patient to [facility].  Called and updated patient's Son Maureen Wilson and his wife that patient is transferring at this time.

## 2019-09-02 NOTE — Procedures (Signed)
Endotracheal Intubation: Patient required placement of an artificial airway secondary to Respiratory Failure  Consent: Emergent.   Hand washing performed prior to starting the procedure.   Medications administered for sedation prior to procedure:  Midazolam 4 mg IV,  Vecuronium 10 mg IV, Fentanyl 100 mcg IV.    A time out procedure was called and correct patient, name, & ID confirmed. Needed supplies and equipment were assembled and checked to include ETT, 10 ml syringe, Glidescope, Mac and Miller blades, suction, oxygen and bag mask valve, end tidal CO2 monitor.   Patient was positioned to align the mouth and pharynx to facilitate visualization of the glottis.   Heart rate, SpO2 and blood pressure was continuously monitored during the procedure. Pre-oxygenation was conducted prior to intubation and endotracheal tube was placed through the vocal cords into the trachea.     The artificial airway was placed under direct visualization via glidescope route using a 8.0 ETT on the first attempt.  ETT was secured at 23 cm mark.  Placement was confirmed by auscuitation of lungs with good breath sounds bilaterally and no stomach sounds.  Condensation was noted on endotracheal tube.   Pulse ox 98%.  CO2 detector in place with appropriate color change.   Complications: None .   Operator: Jennavieve Arrick.   Chest radiograph ordered and pending.   Comments: OGT placed via glidescope.  Twanda Stakes David Dennis Hegeman, M.D.  [facility]  Medical Director ICU-[facility] [place] Medical Director [facility] Cardio-Pulmonary Department       

## 2019-09-02 NOTE — Progress Notes (Signed)
Spoke with respiratory prior to assessing patient. Made respiratory aware patient has been on 7.5L via [place]. Current sat 92-96%, has history of copd. Asked respiratory what's the plan and what else can she be put on like a HFNC or NRB. Per respiratory patient is not a retainer and just wean her dow to 6l. Will continue to monitor closely

## 2019-09-02 NOTE — Progress Notes (Signed)
eLink Physician-Brief Progress Note Patient Name: Maureen Wilson DOB: [birth year] MRN: [id number]   Date of Service  2019-09-05  HPI/Events of Note  Pt needs orders for Fentanyl & Amiodarone infusions, as well as ventilator.  eICU Interventions  Orders entered.        Kerry Kass [name] 09-05-2019, 10:05 PM

## 2019-09-02 NOTE — Consult Note (Signed)
  Amiodarone Drug - Drug Interaction Consult Note  Recommendations: Pt is on warfarin PTA, holding due to elevated INR. Adjust warfarin dose if pt is discharged on amiodarone. Pt home dilt, atenolol, lasix due to hypotension on admission. BP improving, monitor medication restart.   Amiodarone is metabolized by the cytochrome P450 system and therefore has the potential to cause many drug interactions. Amiodarone has an average plasma half-life of 50 days (range 20 to 100 days).   There is potential for drug interactions to occur several weeks or months after stopping treatment and the onset of drug interactions may be slow after initiating amiodarone.   []  Statins: Increased risk of myopathy. Simvastatin- restrict dose to 20mg  daily. Other statins: counsel patients to report any muscle pain or weakness immediately.  [x]  Anticoagulants: Amiodarone can increase anticoagulant effect. Consider warfarin dose reduction. Patients should be monitored closely and the dose of anticoagulant altered accordingly, remembering that amiodarone levels take several weeks to stabilize.  []  Antiepileptics: Amiodarone can increase plasma concentration of phenytoin, the dose should be reduced. Note that small changes in phenytoin dose can result in large changes in levels. Monitor patient and counsel on signs of toxicity.  [x]  Beta blockers: increased risk of bradycardia, AV block and myocardial depression. Sotalol - avoid concomitant use. Pt takes atenolol PTA.   [x]   Calcium channel blockers (diltiazem and verapamil): increased risk of bradycardia, AV block and myocardial depression. Pt takes diltiazem 120 mg daily  []   Cyclosporine: Amiodarone increases levels of cyclosporine. Reduced dose of cyclosporine is recommended.   []  Digoxin dose should be halved when amiodarone is started.  [x]  Diuretics: increased risk of cardiotoxicity if hypokalemia occurs. Pt takes lasix PTA.   []  Oral hypoglycemic agents  (glyburide, glipizide, glimepiride): increased risk of hypoglycemia. Patient's glucose levels should be monitored closely when initiating amiodarone therapy.   [x]  Drugs that prolong the QT interval:  Torsades de pointes risk may be increased with concurrent use - avoid if possible.  Monitor QTc, also keep magnesium/potassium WNL if concurrent therapy can't be avoided. Marland Kitchen Antibiotics: e.g. fluoroquinolones, erythromycin. . Antiarrhythmics: e.g. quinidine, procainamide, disopyramide, sotalol. . Antipsychotics: e.g. phenothiazines, haloperidol.  . Lithium, tricyclic antidepressants, and methadone.  Pt is on escitalopram,   Thank You,  Oswald Hillock  2019/09/05 8:00 AM

## 2019-09-02 NOTE — Progress Notes (Addendum)
Rapid response called. Patient unable to keep sats above 84 on HFNC. Patient was on 7L sat was in low 80's, had attempted to wean to 6l per respiratory. During rapid patient being placed on NRB. Sat increased to 93%, however patient becoming tired and still working. Patient to be transferred to CCU bed 10. Will continue to monitor closely

## 2019-09-02 NOTE — Progress Notes (Addendum)
Rapid response called. Patient unable to keep sats above 84 on HFNC. Patient was on 7L sat was in low 80's, had attempted to wean to 6l per respiratory. During rapid patient being placed on NRB.   She is being transferred to ICU.  Will transfer care to ICU attending Dr. Mortimer Fries.  Please note she was earlier waiting to get transfer to [facility] due to Covid pneumonia and family was aware and in agreement for [facility] transfer.

## 2019-09-02 NOTE — Progress Notes (Signed)
Called to patient bedside @ 0840 due to patient saturations in the 70'S on 6lpm. Placed patient on HFNC 15lpm patient saturations 84%. Patient placed on NRB mask 15lpm and patient saturations 93%.  Rapid response called.  Patient transported to ICU bed 10.

## 2019-09-02 NOTE — Progress Notes (Signed)
Pt was started on Amiodarone gtt per MD Tu's order for HR in the 120-140's sustaining. Pt was given an Amiodarone bolus around 2245 for the same reason, it did help initially but pt's HR went back to 120-140's, up to 150's at times with coughing spells. Will continue to monitor pt.   [phone] Message sent to Dr. Manuella Ghazi to make him aware that Pt's INR is 9.7 this AM.

## 2019-09-02 NOTE — Progress Notes (Signed)
eLink Physician-Brief Progress Note Patient Name: Maureen Wilson DOB: 09-08-1941 MRN: [id number]   Date of Service  Sep 29, 2019  HPI/Events of Note  Pt with Covid-19 pneumonia and hypoxemic respiratory failure intubated earlier today then transferred to [facility] on the ventilator.  eICU Interventions  New Pt evaluation completed.        Maureen Wilson 09-29-19, 8:44 PM

## 2019-09-02 NOTE — Procedures (Signed)
Central Venous Catheter Placement:TRIPLE LUMEN Indication: Patient receiving vesicant or irritant drug.; Patient receiving intravenous therapy for longer than 5 days.; Patient has limited or no vascular access.   Consent:emergent    Hand washing performed prior to starting the procedure.   Procedure:   An active timeout was performed and correct patient, name, & ID confirmed.   Patient was positioned correctly for central venous access.  Patient was prepped using strict sterile technique including chlorohexadine preps, sterile drape, sterile gown and sterile gloves.    The area was prepped, draped and anesthetized in the usual sterile manner. Patient comfort was obtained.    A triple lumen catheter was placed in RT  Internal Jugular Vein There was good blood return, catheter caps were placed on lumens, catheter flushed easily, the line was secured and a sterile dressing and BIO-PATCH applied.   Ultrasound was used to visualize vasculature and guidance of needle.   Number of Attempts: 1 Complications:none Estimated Blood Loss: none Chest Radiograph indicated and ordered.  Operator: Angeliyah Kirkey.   Maureen Wilson, M.D.  [facility]  Medical Director ICU-[facility] [place] [place] Medical Director [facility] Cardio-Pulmonary Department     

## 2019-09-02 NOTE — Consult Note (Signed)
Today (11/25), pt will receive the last dose of remdesivir.    Maureen Wilson, PharmD

## 2019-09-02 NOTE — Progress Notes (Signed)
Spoke to [employer] - they stated they would arrive in approximately 2 hrs to transfer patient to [facility].  I gave report to the nurse Jocelyn Lamer- who will pick up the patient at [facility].

## 2019-09-02 NOTE — H&P (Signed)
History and Physical    Maureen Wilson ZOX:[id number] DOB: 1941/07/03 DOA: 02/09/19    PCP: Joanna Hewsate, Allen D, MD  [facility]   Chief Complaint: Shortness of breath  HPI: Maureen Mullatricia King Reddish is a 78 y.o. female with medical history significant of chronic A. fib, COPD, HTN, who was being [facility] when she went into acute respiratory failure with hypoxia requiring ET intubation, she was also found to be in A. fib with RVR.  Patient is currently intubated and unable to provide history, no family is available at this time, history obtained from reviewing the medical chart.  According to the previous H&P, she developed nausea, vomiting and generalized weakness on 08/22/2019, toes progressively worsened prompting her to seek medical attention on 08/29/2019.  In the ER she was noted to have acute respiratory failure with hypoxia secondary to Covid pneumonia, [facility].  She was started on remdesivir and Decadron, she is stabilized on 6 to 8 L/min [place] O2.  Rapid response was called 02/09/19 where she was noted to have increasing work of breathing and subsequently intubated.  She has chronic A. fib, she went into A. fib with RVR and was started on amiodarone.  Review of Systems: Unable to obtain patient is intubated and sedated  Past Medical History:  Diagnosis Date  . Asthma   . Atrial fibrillation (HCC)   . Back pain   . Bronchitis    hx of  . CHF (congestive heart failure) (HCC)   . Chronic kidney disease   . Chronic respiratory failure (HCC)   . COPD (chronic obstructive pulmonary disease) (HCC)   . Emphysema lung (HCC)   . Hypertension     Past Surgical History:  Procedure Laterality Date  . ABDOMINAL HYSTERECTOMY    . PARTIAL HIP ARTHROPLASTY Right   . TOTAL HIP ARTHROPLASTY Left   . TOTAL HIP REVISION Left 01/17/2017   Procedure: LEFT TOTAL HIP REVISION;  Surgeon: Samson FredericBrian Swinteck, MD;  Location: [facility] ORS;  Service:  Orthopedics;  Laterality: Left;     reports that she has never smoked. She has never used smokeless tobacco. She reports that she does not drink alcohol or use drugs.  Allergies  Allergen Reactions  . Amlodipine Swelling and Other (See Comments)    Reaction:  Lip/mouth swelling   . Hydralazine Swelling and Other (See Comments)    Reaction:  Lip/mouth swelling   . Iohexol Hives and Other (See Comments)    Desc: Pt developed hives after receiving iv dye for ct scan.  suggest she be premedicated for future exams, Onset Date: [id number]   . Metoprolol Swelling and Other (See Comments)    Reaction:  Lip/mouth swelling   . Olmesartan Swelling and Other (See Comments)    Reaction:  Lip/mouth swelling   . Other Swelling    Lip/mouth swelling  . Ramipril Swelling and Other (See Comments)    Reaction:  Lip/mouth swelling   . Risperidone Other (See Comments)  . Sulfa Antibiotics Swelling and Other (See Comments)    Reaction:  Lip/mouth swelling  angioedema  . Telmisartan Swelling and Other (See Comments)    Reaction:  Lip/mouth swelling   . Tiotropium Swelling and Other (See Comments)    Reaction:  Lip/mouth swelling     Family History  Problem Relation Age of Onset  . Hypertension Son      Prior to Admission medications   Medication Sig Start Date End Date Taking?  Authorizing Provider  ALPRAZolam (XANAX) 0.25 MG tablet Take 0.25 mg by mouth every 8 (eight) hours as needed for anxiety.     [provider]  amiodarone (NEXTERONE PREMIX) 360-4.14 MG/200ML-% SOLN Inject 30 mg/hr into the vein continuous. 09/11/2019   Max Sane, MD  aspirin EC 81 MG tablet Take 81 mg by mouth daily.    [provider]  atenolol (TENORMIN) 25 MG tablet Take 25 mg by mouth daily.     [provider]  atorvastatin (LIPITOR) 20 MG tablet Take 20 mg by mouth at bedtime.     [provider]  cholecalciferol (VITAMIN D) 1000 units tablet Take 2,000 Units by mouth daily.     [provider]  clonazePAM (KLONOPIN) 1 MG tablet Take 0.5 mg by mouth 2 (two) times daily.     [provider]  dexamethasone (DECADRON) 10 MG/ML injection Inject 0.6 mLs (6 mg total) into the vein daily. September 11, 2019   Max Sane, MD  docusate sodium (COLACE) 100 MG capsule Take 100 mg by mouth daily.    [provider]  escitalopram (LEXAPRO) 20 MG tablet Take 20 mg by mouth daily.    [provider]  Esomeprazole Magnesium 20 MG TBEC Take 20 mg by mouth See admin instructions. Take 1 tablet (20mg ) by mouth daily and take 1 additional tablet (20mg ) by mouth daily as needed for heartburn/acid reflux symptoms    [provider]  ferrous gluconate (FERGON) 240 (27 FE) MG tablet Take 240 mg by mouth daily.    [provider]  fluticasone furoate-vilanterol (BREO ELLIPTA) 100-25 MCG/INH AEPB Inhale 1 puff into the lungs daily. 03/27/16   [provider]  furosemide (LASIX) 40 MG tablet Take 1 tablet (40 mg total) by mouth every other day. Patient taking differently: Take 40 mg by mouth See admin instructions. Take 1 tablet (40mg ) by mouth every Sunday, Tuesday, Thursday and Saturday morning 01/21/16   Fritzi Mandes, MD  Ipratropium-Albuterol (COMBIVENT) 20-100 MCG/ACT AERS respimat Inhale 1 puff into the lungs 4 (four) times daily as needed for wheezing or shortness of breath.     [provider]  levothyroxine (SYNTHROID, LEVOTHROID) 50 MCG tablet Take 50-75 mcg by mouth See admin instructions. Take 1 tablet (79mcg) by mouth every Tuesday, Wednesday, Friday, Saturday and Sunday morning and take 1 tablets (16mcg) by mouth every Monday and Thursday morning    [provider]  metolazone (ZAROXOLYN) 2.5 MG tablet Take 2.5 mg by mouth See admin instructions. Take 1 tablet (2.5mg ) by mouth every Monday, Thursday and Saturday morning    [provider]  montelukast (SINGULAIR) 10 MG tablet Take 10 mg by mouth at bedtime.     [provider]  Multiple Vitamins-Minerals (CENTRUM SILVER PO) Take 1 tablet by mouth daily.    [provider]  potassium chloride SA (K-DUR,KLOR-CON) 20 MEQ tablet Take 1 tablet (20 mEq total) by mouth every other day. Patient taking differently: Take 10-20 mEq by mouth See admin instructions. Take  tablet (29meq) by mouth every morning and take 1 tablet (40meq) by mouth every evening 01/21/16   Fritzi Mandes, MD  spironolactone (ALDACTONE) 25 MG tablet Take 25 mg by mouth See admin instructions. Take 1 tablet (25mg ) by mouth every Monday and Thursday morning 04/07/19   [provider]  tamsulosin (FLOMAX) 0.4 MG CAPS capsule Take 0.4 mg by mouth daily.     [provider]  vitamin C (VITAMIN C) 500 MG tablet Take 1  tablet (500 mg total) by mouth daily. 08/11/2019   Delfino Lovett, MD  warfarin (COUMADIN) 2 MG tablet Take 1 mg by mouth every evening.  12/12/16   [provider]  zinc sulfate 220 (50 Zn) MG capsule Take 1 capsule (220 mg total) by mouth daily. 08/22/2019   Delfino Lovett, MD    Physical Exam: Vitals:   08/11/2019 2015 08/16/2019 2031 08/12/2019 2100 08/12/2019 2200  BP: (!) 123/95  (!) 147/92 123/68  Pulse: (!) 50  (!) 134 76  Resp: 18 (!) 22 (!) 23 (!) 23  Temp:      TempSrc:      SpO2: 100%  100% 99%    Constitutional: NAD, calm, comfortable Vitals:   08/16/2019 2015 08/26/2019 2031 08/29/2019 2100 08/29/2019 2200  BP: (!) 123/95  (!) 147/92 123/68  Pulse: (!) 50  (!) 134 76  Resp: 18 (!) 22 (!) 23 (!) 23  Temp:      TempSrc:      SpO2: 100%  100% 99%   Eyes: PERRL, lids and conjunctivae normal ENMT: Mucous membranes are moist. Posterior pharynx clear of any exudate or lesions.Normal dentition.  Neck: normal, supple, no masses, no thyromegaly Respiratory: clear to auscultation bilaterally, no wheezing, no crackles. Normal respiratory effort. No accessory muscle use.  Cardiovascular: Regular rate and rhythm, no murmurs / rubs / gallops. No  extremity edema. 2+ pedal pulses. No carotid bruits.  Abdomen: no tenderness, no masses palpated. No hepatosplenomegaly. Bowel sounds positive.  Musculoskeletal: no clubbing / cyanosis. No joint deformity upper and lower extremities. Good ROM, no contractures. Normal muscle tone.  Skin: no rashes, lesions, ulcers. No induration Neurologic: CN 2-12 grossly intact. Sensation intact, DTR normal. Strength 5/5 in all 4.  Psychiatric: Normal judgment and insight. Alert and oriented x 3. Normal mood.   Wt Readings from Last 10 Encounters:  08/19/2019 89.1 kg  05/12/19 94.3 kg  02/06/17 106 kg  01/30/17 109.1 kg  01/16/17 103.4 kg  01/21/16 105.4 kg  10/02/15 107.3 kg    Labs on Admission: I have personally reviewed following labs and imaging studies  CBC: Recent Labs  Lab 08/29/19 0506 08/30/19 0418 08/31/19 0416 09/01/19 0705 08/13/2019 0414 09/05/2019 1033 08/23/2019 2145  WBC 7.5 6.1 5.9 9.2 8.8 7.9  --   NEUTROABS 6.2  --  5.4 8.5* 8.2* 7.4  --   HGB 11.8* 11.2* 11.6* 11.6* 11.3* 11.0* 12.9  HCT 35.0* 32.5* 33.1* 33.2* 34.1* 32.5* 38.0  MCV 98.0 94.2 93.2 93.3 98.3 97.0  --   PLT 124* 106* 108* 130* 150 151  --    Basic Metabolic Panel: Recent Labs  Lab 08/31/19 0416 08/31/19 1750 09/01/19 0705 08/25/2019 0414 08/22/2019 1033 08/17/2019 1707 08/25/2019 2145  NA 128* 130* 128* 127* 126*  --  125*  K 3.1* 4.2 4.3 4.2 3.9  --  4.2  CL 93* 92* 95* 96* 92*  --   --   CO2 23 22 21* 20* 24  --   --   GLUCOSE 140* 143* 172* 175* 172*  --   --   BUN 26* 26* 27* 27* 30*  --   --   CREATININE 1.21* 1.42* 1.23* 1.25* 1.25*  --   --   CALCIUM 7.8* 8.5* 8.1* 8.4* 8.1*  --   --   MG 1.5* 1.9 2.0 1.8  --  1.9  --   PHOS  --   --   --   --   --  3.1  --  GFR: Estimated Creatinine Clearance: 39.3 mL/min (A) (by C-G formula based on SCr of 1.25 mg/dL (H)). Liver Function Tests: Recent Labs  Lab 08/30/19 0418 08/31/19 0416 09/01/19 0705 09-13-2019 0414 2019-09-13 1033  AST 40 46* 52* 50*  45*  ALT 19 21 21 22 23   ALKPHOS 53 58 63 64 68  BILITOT 1.3* 0.9 1.0 0.9 1.1  PROT 5.7* 5.7* 5.7* 5.7* 5.6*  ALBUMIN 2.7* 2.6* 2.6* 2.6* 2.6*   No results for input(s): LIPASE, AMYLASE in the last 168 hours. No results for input(s): AMMONIA in the last 168 hours. Coagulation Profile: Recent Labs  Lab 08/29/19 0506 08/30/19 0418 08/31/19 0416 09/01/19 0705 09-13-19 0414  INR 7.1* 8.0* 5.7* 6.5* 9.7*   Cardiac Enzymes: No results for input(s): CKTOTAL, CKMB, CKMBINDEX, TROPONINI in the last 168 hours. BNP (last 3 results) No results for input(s): PROBNP in the last 8760 hours. HbA1C: No results for input(s): HGBA1C in the last 72 hours. CBG: Recent Labs  Lab 08/31/19 0801 09/01/19 0732 09/01/19 1139 2019-09-13 0759 Sep 13, 2019 0949  GLUCAP 126* 167* 171* 160* 174*   Lipid Profile: No results for input(s): CHOL, HDL, LDLCALC, TRIG, CHOLHDL, LDLDIRECT in the last 72 hours. Thyroid Function Tests: No results for input(s): TSH, T4TOTAL, FREET4, T3FREE, THYROIDAB in the last 72 hours. Anemia Panel: Recent Labs    09/01/19 0705 September 13, 2019 0414  FERRITIN 308* 213   Urine analysis:    Component Value Date/Time   COLORURINE YELLOW (A) 08/29/2019 0506   APPEARANCEUR CLOUDY (A) 08/29/2019 0506   APPEARANCEUR CLEAR 01/11/2015 0950   LABSPEC 1.013 08/29/2019 0506   LABSPEC 1.005 01/11/2015 0950   PHURINE 7.0 08/29/2019 0506   GLUCOSEU NEGATIVE 08/29/2019 0506   GLUCOSEU NEGATIVE 01/11/2015 0950   HGBUR NEGATIVE 08/29/2019 0506   BILIRUBINUR NEGATIVE 08/29/2019 0506   BILIRUBINUR NEGATIVE 01/11/2015 0950   KETONESUR NEGATIVE 08/29/2019 0506   PROTEINUR 30 (A) 08/29/2019 0506   UROBILINOGEN 1.0 11/21/2010 1115   NITRITE NEGATIVE 08/29/2019 0506   LEUKOCYTESUR LARGE (A) 08/29/2019 0506   LEUKOCYTESUR NEGATIVE 01/11/2015 0950    Radiological Exams on Admission: Dg Abd 1 View  Result Date: 2019-09-13 CLINICAL DATA:  OG TUBE PLACEMENT. EXAM: ABDOMEN - 1 VIEW COMPARISON:   Chest x-ray 09/13/2019. FINDINGS: OG tube noted with tip and side hole over the stomach. No gastric or bowel distention. No free air. Calcific density noted over the right lung base consistent calcified granuloma. IMPRESSION: OG tube noted with tip and side hole over the stomach. No gastric or bowel distention. Electronically Signed   By: 09/04/2019  Register   On: 09/13/19 10:58   Dg Chest Port 1 View  Result Date: 13-Sep-2019 CLINICAL DATA:  Nasogastric tube placement. EXAM: PORTABLE CHEST 1 VIEW COMPARISON:  08/29/2019. FINDINGS: Endotracheal tube terminates 1.5 cm above the carina. Nasogastric tube is followed into the stomach with the side port just beyond the gastroesophageal junction. Right IJ central line tip is at the SVC RA junction. Heart is enlarged, stable. Thoracic aorta is calcified. Mild mixed interstitial and airspace opacification bilaterally, slightly progressive from 08/29/2019. There may be a tiny left pleural effusion. Biapical pleural thickening. IMPRESSION: 1. Endotracheal tube is somewhat low lying. Retracting approximately 2 cm would better position the tip above the carina. 2. Nasogastric tube terminates in the stomach. 3. Mild increase in diffuse mixed interstitial and airspace opacification, indicative of pneumonia in this patient with COVID-19 infection. 4. Possible tiny left pleural effusion. 5.  Aortic atherosclerosis (ICD10-170.0). Electronically Signed   By:  Leanna Battles M.D.   On: 08/18/2019 10:59    EKG: Independently reviewed.   Assessment/Plan Principal Problem:   Acute hypoxemic respiratory failure due to severe acute respiratory syndrome coronavirus 2 (SARS-CoV-2) disease (HCC) Active Problems:   Chronic atrial fibrillation (HCC)   Hypertension   COPD (chronic obstructive pulmonary disease) (HCC)   CHF (congestive heart failure) (HCC)  78 year old Caucasian woman with COPD, HFpEF, HTN, chronic A. fib presented as a transfer from [facility] after she was intubated  for acute respiratory failure with hypoxia.  Acute hypoxemic respiratory failure due to SARS-CoV-2/COPD: Patient with COPD on 2 to 3 L [place] O2 at baseline, OSA, obesity who has had progressive shortness of breath, CXR with bilateral opacities, rapid response called and subsequently intubated.  Poor prognosis. Oxygen requirements: 50% FiO2 on vent Antibiotics: Ceftriaxone for UTIcompleted. Diuretics: Currently on hold  Vitamin C and Zinc: Started on 08/30/2019 Remdesivir: Started on 08/29/2019 Steroids: Started on 08/30/2019. CRP9.7--15.6--10.6  INR - 6.5-> 9.7 Coumadin is on hold since 11/21     Chronic atrial fibrillation (HCC)/supratherapeutic INR: Went into A. fib with RVR, currently rate controlled on amiodarone.  Warfarin on hold for supratherapeutic INR.  No active bleeding.  Pharmacy to dose    Hypertension: Episodes of hypotension particularly with tachycardia.  Holding home meds.    CHF (congestive heart failure) (HCC): Clinically euvolemic.  Marginally elevated BNP.  Unlikely contributing to her current presentation.  Continue diuresis as tolerated.  Monitor renal function.  DVT prophylaxis: Warfarin  Code Status: Full code Disposition Plan: She will probably need inpatient rehabilitation at discharge Consults called: N/A Admission status: inpatient   Coletta Memos MD [facility] Pager [phone]  If 7PM-7AM, please contact night-coverage [email or web address] Password [facility]  09/01/2019, 10:43 PM

## 2019-09-03 DIAGNOSIS — J80 Acute respiratory distress syndrome: Secondary | ICD-10-CM

## 2019-09-03 DIAGNOSIS — U071 COVID-19: Principal | ICD-10-CM

## 2019-09-03 DIAGNOSIS — J9601 Acute respiratory failure with hypoxia: Secondary | ICD-10-CM

## 2019-09-03 DIAGNOSIS — R791 Abnormal coagulation profile: Secondary | ICD-10-CM

## 2019-09-03 DIAGNOSIS — E871 Hypo-osmolality and hyponatremia: Secondary | ICD-10-CM

## 2019-09-03 DIAGNOSIS — Z9911 Dependence on respirator [ventilator] status: Secondary | ICD-10-CM

## 2019-09-03 DIAGNOSIS — I482 Chronic atrial fibrillation, unspecified: Secondary | ICD-10-CM

## 2019-09-03 DIAGNOSIS — J441 Chronic obstructive pulmonary disease with (acute) exacerbation: Secondary | ICD-10-CM

## 2019-09-03 DIAGNOSIS — N179 Acute kidney failure, unspecified: Secondary | ICD-10-CM

## 2019-09-03 DIAGNOSIS — I10 Essential (primary) hypertension: Secondary | ICD-10-CM

## 2019-09-03 LAB — CULTURE, BLOOD (ROUTINE X 2)
Culture: NO GROWTH
Culture: NO GROWTH
Special Requests: ADEQUATE
Special Requests: ADEQUATE

## 2019-09-03 LAB — POCT I-STAT 7, (LYTES, BLD GAS, ICA,H+H)
Acid-base deficit: 3 mmol/L — ABNORMAL HIGH (ref 0.0–2.0)
Bicarbonate: 22.6 mmol/L (ref 20.0–28.0)
Calcium, Ion: 1.21 mmol/L (ref 1.15–1.40)
HCT: 33 % — ABNORMAL LOW (ref 36.0–46.0)
Hemoglobin: 11.2 g/dL — ABNORMAL LOW (ref 12.0–15.0)
O2 Saturation: 96 %
Potassium: 4 mmol/L (ref 3.5–5.1)
Sodium: 126 mmol/L — ABNORMAL LOW (ref 135–145)
TCO2: 24 mmol/L (ref 22–32)
pCO2 arterial: 40.4 mmHg (ref 32.0–48.0)
pH, Arterial: 7.356 (ref 7.350–7.450)
pO2, Arterial: 88 mmHg (ref 83.0–108.0)

## 2019-09-03 LAB — CBC
HCT: 34 % — ABNORMAL LOW (ref 36.0–46.0)
HCT: 33.8 % — ABNORMAL LOW (ref 36.0–46.0)
Hemoglobin: 10.9 g/dL — ABNORMAL LOW (ref 12.0–15.0)
Hemoglobin: 11.1 g/dL — ABNORMAL LOW (ref 12.0–15.0)
MCH: 32.7 pg (ref 26.0–34.0)
MCH: 33 pg (ref 26.0–34.0)
MCHC: 32.1 g/dL (ref 30.0–36.0)
MCHC: 32.8 g/dL (ref 30.0–36.0)
MCV: 102.1 fL — ABNORMAL HIGH (ref 80.0–100.0)
MCV: 100.6 fL — ABNORMAL HIGH (ref 80.0–100.0)
Platelets: 157 10*3/uL (ref 150–400)
Platelets: 183 10*3/uL (ref 150–400)
RBC: 3.33 MIL/uL — ABNORMAL LOW (ref 3.87–5.11)
RBC: 3.36 MIL/uL — ABNORMAL LOW (ref 3.87–5.11)
RDW: 13.9 % (ref 11.5–15.5)
RDW: 13.9 % (ref 11.5–15.5)
WBC: 11.3 10*3/uL — ABNORMAL HIGH (ref 4.0–10.5)
WBC: 10.4 10*3/uL (ref 4.0–10.5)
nRBC: 0 % (ref 0.0–0.2)
nRBC: 0 % (ref 0.0–0.2)

## 2019-09-03 LAB — C-REACTIVE PROTEIN: CRP: 5 mg/dL — ABNORMAL HIGH (ref <1.0)

## 2019-09-03 LAB — COMPREHENSIVE METABOLIC PANEL
ALT: 22 U/L (ref 0–44)
AST: 46 U/L — ABNORMAL HIGH (ref 15–41)
Albumin: 2.6 g/dL — ABNORMAL LOW (ref 3.5–5.0)
Alkaline Phosphatase: 76 U/L (ref 38–126)
Anion gap: 14 (ref 5–15)
BUN: 37 mg/dL — ABNORMAL HIGH (ref 8–23)
CO2: 21 mmol/L — ABNORMAL LOW (ref 22–32)
Calcium: 8.7 mg/dL — ABNORMAL LOW (ref 8.9–10.3)
Chloride: 93 mmol/L — ABNORMAL LOW (ref 98–111)
Creatinine, Ser: 1.96 mg/dL — ABNORMAL HIGH (ref 0.44–1.00)
GFR calc Af Amer: 28 mL/min — ABNORMAL LOW (ref >60)
GFR calc non Af Amer: 24 mL/min — ABNORMAL LOW (ref >60)
Glucose, Bld: 172 mg/dL — ABNORMAL HIGH (ref 70–99)
Potassium: 4.7 mmol/L (ref 3.5–5.1)
Sodium: 128 mmol/L — ABNORMAL LOW (ref 135–145)
Total Bilirubin: 1.1 mg/dL (ref 0.3–1.2)
Total Protein: 6 g/dL — ABNORMAL LOW (ref 6.5–8.1)

## 2019-09-03 LAB — PROTIME-INR
INR: 6.2 (ref 0.8–1.2)
Prothrombin Time: 54.8 seconds — ABNORMAL HIGH (ref 11.4–15.2)

## 2019-09-03 LAB — GLUCOSE, CAPILLARY
Glucose-Capillary: 161 mg/dL — ABNORMAL HIGH (ref 70–99)
Glucose-Capillary: 156 mg/dL — ABNORMAL HIGH (ref 70–99)
Glucose-Capillary: 151 mg/dL — ABNORMAL HIGH (ref 70–99)
Glucose-Capillary: 148 mg/dL — ABNORMAL HIGH (ref 70–99)

## 2019-09-03 LAB — D-DIMER, QUANTITATIVE: D-Dimer, Quant: 0.32 ug/mL-FEU (ref 0.00–0.50)

## 2019-09-03 LAB — PHOSPHORUS: Phosphorus: 3.4 mg/dL (ref 2.5–4.6)

## 2019-09-03 LAB — FERRITIN: Ferritin: 177 ng/mL (ref 11–307)

## 2019-09-03 LAB — OSMOLALITY: Osmolality: 280 mOsm/kg (ref 275–295)

## 2019-09-03 LAB — MAGNESIUM: Magnesium: 1.8 mg/dL (ref 1.7–2.4)

## 2019-09-03 MED ORDER — FREE WATER
100.0000 mL | Freq: Three times a day (TID) | Status: DC
  Administered 2019-09-03 – 2019-09-06 (×9): 100 mL

## 2019-09-03 MED ORDER — SODIUM CHLORIDE 0.9 % IV BOLUS
500.0000 mL | Freq: Once | INTRAVENOUS | Status: AC
  Administered 2019-09-03: 500 mL via INTRAVENOUS

## 2019-09-03 MED ORDER — INSULIN ASPART 100 UNIT/ML ~~LOC~~ SOLN
0.0000 [IU] | SUBCUTANEOUS | Status: DC
  Administered 2019-09-03: 7 [IU] via SUBCUTANEOUS
  Administered 2019-09-03: 4 [IU] via SUBCUTANEOUS
  Administered 2019-09-04 (×2): 3 [IU] via SUBCUTANEOUS
  Administered 2019-09-04: 4 [IU] via SUBCUTANEOUS
  Administered 2019-09-04 – 2019-09-05 (×2): 3 [IU] via SUBCUTANEOUS
  Administered 2019-09-05 – 2019-09-06 (×4): 7 [IU] via SUBCUTANEOUS
  Administered 2019-09-06: 3 [IU] via SUBCUTANEOUS
  Administered 2019-09-06: 7 [IU] via SUBCUTANEOUS

## 2019-09-03 MED ORDER — FAMOTIDINE 20 MG PO TABS
20.0000 mg | ORAL_TABLET | Freq: Every day | ORAL | Status: DC
  Administered 2019-09-03 – 2019-09-04 (×2): 20 mg via ORAL
  Filled 2019-09-03 (×2): qty 1

## 2019-09-03 MED ORDER — LACTATED RINGERS IV BOLUS
250.0000 mL | Freq: Once | INTRAVENOUS | Status: AC
  Administered 2019-09-03: 250 mL via INTRAVENOUS

## 2019-09-03 MED ORDER — CHLORHEXIDINE GLUCONATE 0.12 % MT SOLN
15.0000 mL | Freq: Two times a day (BID) | OROMUCOSAL | Status: DC
  Administered 2019-09-03 – 2019-09-06 (×6): 15 mL via OROMUCOSAL
  Filled 2019-09-03 (×3): qty 15

## 2019-09-03 MED ORDER — FENTANYL CITRATE (PF) 100 MCG/2ML IJ SOLN
25.0000 ug | Freq: Once | INTRAMUSCULAR | Status: AC
  Administered 2019-09-03: 25 ug via INTRAVENOUS

## 2019-09-03 MED ORDER — VITAL HIGH PROTEIN PO LIQD
1000.0000 mL | ORAL | Status: DC
  Administered 2019-09-03: 1000 mL

## 2019-09-03 MED ORDER — MIDAZOLAM HCL 2 MG/2ML IJ SOLN
1.0000 mg | INTRAMUSCULAR | Status: DC | PRN
  Administered 2019-09-03 – 2019-09-06 (×4): 1 mg via INTRAVENOUS
  Filled 2019-09-03 (×4): qty 2

## 2019-09-03 MED ORDER — FENTANYL 2500MCG IN NS 250ML (10MCG/ML) PREMIX INFUSION
25.0000 ug/h | INTRAVENOUS | Status: DC
  Administered 2019-09-04: 150 ug/h via INTRAVENOUS
  Administered 2019-09-04: 175 ug/h via INTRAVENOUS
  Administered 2019-09-05: 125 ug/h via INTRAVENOUS
  Administered 2019-09-06: 150 ug/h via INTRAVENOUS
  Filled 2019-09-03 (×5): qty 250

## 2019-09-03 MED ORDER — PHYTONADIONE 5 MG PO TABS
5.0000 mg | ORAL_TABLET | Freq: Once | ORAL | Status: AC
  Administered 2019-09-03: 5 mg via ORAL
  Filled 2019-09-03: qty 1

## 2019-09-03 MED ORDER — FENTANYL BOLUS VIA INFUSION
25.0000 ug | INTRAVENOUS | Status: DC | PRN
  Administered 2019-09-05 – 2019-09-06 (×7): 25 ug via INTRAVENOUS
  Filled 2019-09-03: qty 25

## 2019-09-03 NOTE — Progress Notes (Addendum)
NAME:  Maureen Wilson, MRN:  [id number], DOB:  17-Jan-1941, LOS: 1 ADMISSION DATE:  08/10/2019, CONSULTATION DATE:  08/25/2019 REFERRING MD:  Posey Pronto, CHIEF COMPLAINT:  Respiratory failure   Brief History   COVID-19 pneumonia, progressive hypoxia necessitating intubation after 4 days on HFNC due to work of breathing.  History of present illness   Maureen Wilson is a 78 y.o. female with medical history significant of chronic A. fib, COPD, HTN, who was being treated for SARS-CoV-2 infection at [facility] when she went into acute respiratory failure with hypoxia requiring ET intubation, she was also found to be in A. fib with RVR.  Patient is currently intubated and unable to provide history, no family is available at this time, history obtained from reviewing the medical chart.  According to the previous H&P, she developed nausea, vomiting and generalized weakness on 08/22/2019, toes progressively worsened prompting her to seek medical attention on 08/29/2019.  In the ER she was noted to have acute respiratory failure with hypoxia secondary to Covid pneumonia, she was admitted at [facility].  She was started on remdesivir and Decadron, she is stabilized on 6 to 8 L/min [place] O2.  Rapid response was called 09/07/2019 where she was noted to have increasing work of breathing and subsequently intubated.  She has chronic A. fib, she went into A. fib with RVR and was started on amiodarone.  Past Medical History  COPD (no PFTs) Afib CKD HTN  Significant Hospital Events   Intubated 11/25, subsequently transferred to [place]:  PCCM  Procedures:  ETT 11/25  RIJ CVC 11/25  Significant Diagnostic Tests:  11/25 CXR-ET tube 2 cm above the carina, diffuse bilateral infiltrates, tented hemidiaphragms  Micro Data:  covid + Urine 11/21 E. Coli (resistant to ampicillin, Unasyn, ciprofloxacin) Blood 11/21>> NG  Antimicrobials:  Ceftriaxone 11/20, 11/22- 11/23 Remdesivir 11/21-11/25 Dexamethasone  11/22>>  Interim history/subjective:  Awake, following commands  Objective   Blood pressure 102/60, pulse (!) 107, temperature 99.1 F (37.3 C), temperature source Oral, resp. rate (!) 22, weight 94.7 kg, SpO2 97 %.    Vent Mode: PRVC FiO2 (%):  [50 %-80 %] 50 % Set Rate:  [16 bmp-22 bmp] 22 bmp Vt Set:  [420 mL-500 mL] 420 mL PEEP:  [10 cmH20-14 cmH20] 10 cmH20 Plateau Pressure:  [22 cmH20-24 cmH20] 22 cmH20   Intake/Output Summary (Last 24 hours) at 09/03/2019 1432 Last data filed at 09/03/2019 1300 Gross per 24 hour  Intake 1811.35 ml  Output 405 ml  Net 1406.35 ml   Filed Weights   09/03/19 0500  Weight: 94.7 kg    Examination: General: Elderly woman lying in bed intubated, lightly sedated HENT: old-appearing bruising around right eye/brow, eyes anicteric Lungs: Rales bilaterally, breathing comfortably on the vent Cardiovascular: Irregular rhythm, regular rate Abdomen: Obese, soft, nondistended Extremities: No pitting edema Neuro: Eyes open spontaneously, but not tracking.  Following commands in all extremities. GU: Minimal clear yellow urine output  Resolved Hospital Problem list     Assessment & Plan:   Acute hypoxic vent dependent respiratory failure due to ARDS from severe COVID-19 viral pneumonia -Continue low tidal volume ventilation, 48 cc KG ideal body weight with goal plateau less than 30 and driving pressure less than 15.  Follow ARDSNet ladder for PEEP and FiO2 titration.  Titrate down as able. -Daily SAT and SBT when appropriate -Goal RASS 0 to -1-fentanyl and Versed as needed -VAP prevention protocol -Continue dexamethasone; previously completed remdesivir -Does not meet criteria for  proning at this time  Supratherapeutic INR with possible RP bleed; on Coumadin prior to admission -Additional vitamin K today -Serial CBC -Continue to monitor clinically  Hyponatremia, suspect hypovolemic -Check osmolality -500 cc bolus of normal saline  -Continue to monitor  Oliguric AKI -500 cc bolus and monitor urine output. May benefit from additional fluid administration, although we are limited with the degree of respiratory failure she has. -Monitor I/O -Renally dose meds -Avoid nephrotoxic meds -Continue to monitor  Chronic atrial fibrillation -Reversing supratherapeutic INR due to concern for bleeding -Rate control with amiodarone    Best practice:  Diet: tube feeds Pain/Anxiety/Delirium protocol (if indicated): ordered VAP protocol (if indicated): ordered DVT prophylaxis: supratherapeutic warfarin (but bleeding) GI prophylaxis: pepcid Glucose control: SSI Mobility: bedrest Code Status: full Family Communication: will update Disposition: ICU  Labs   CBC: Recent Labs  Lab 08/29/19 0506  08/31/19 0416 09/01/19 0705 2019/09/14 0414 09-14-2019 1033 09-14-19 2145 09/03/19 0459 09/03/19 1138  WBC 7.5   < > 5.9 9.2 8.8 7.9  --  11.3*  --   NEUTROABS 6.2  --  5.4 8.5* 8.2* 7.4  --   --   --   HGB 11.8*   < > 11.6* 11.6* 11.3* 11.0* 12.9 10.9* 11.2*  HCT 35.0*   < > 33.1* 33.2* 34.1* 32.5* 38.0 34.0* 33.0*  MCV 98.0   < > 93.2 93.3 98.3 97.0  --  102.1*  --   PLT 124*   < > 108* 130* 150 151  --  157  --    < > = values in this interval not displayed.    Basic Metabolic Panel: Recent Labs  Lab 08/31/19 1750 09/01/19 0705 09-14-2019 0414 09/14/2019 1033 09-14-2019 1707 09/14/2019 2145 09/03/19 0459 09/03/19 1138  NA 130* 128* 127* 126*  --  125* 128* 126*  K 4.2 4.3 4.2 3.9  --  4.2 4.7 4.0  CL 92* 95* 96* 92*  --   --  93*  --   CO2 22 21* 20* 24  --   --  21*  --   GLUCOSE 143* 172* 175* 172*  --   --  172*  --   BUN 26* 27* 27* 30*  --   --  37*  --   CREATININE 1.42* 1.23* 1.25* 1.25*  --   --  1.96*  --   CALCIUM 8.5* 8.1* 8.4* 8.1*  --   --  8.7*  --   MG 1.9 2.0 1.8  --  1.9  --  1.8  --   PHOS  --   --   --   --  3.1  --  3.4  --    GFR: Estimated Creatinine Clearance: 25.9 mL/min (A) (by C-G formula  based on SCr of 1.96 mg/dL (H)). Recent Labs  Lab 08/29/19 0506 08/29/19 0645  09/01/19 0705 2019/09/14 0414 2019-09-14 1033 09/03/19 0459  PROCALCITON  --  <0.10  --   --   --   --   --   WBC 7.5  --    < > 9.2 8.8 7.9 11.3*  LATICACIDVEN 1.7  --   --   --   --  1.7  --    < > = values in this interval not displayed.    Liver Function Tests: Recent Labs  Lab 08/31/19 0416 09/01/19 0705 2019-09-14 0414 09-14-19 1033 09/03/19 0459  AST 46* 52* 50* 45* 46*  ALT ALKPHOS 58  63 64 68 76  BILITOT 0.9 1.0 0.9 1.1 1.1  PROT 5.7* 5.7* 5.7* 5.6* 6.0*  ALBUMIN 2.6* 2.6* 2.6* 2.6* 2.6*   No results for input(s): LIPASE, AMYLASE in the last 168 hours. No results for input(s): AMMONIA in the last 168 hours.  ABG    Component Value Date/Time   PHART 7.356 09/03/2019 1138   PCO2ART 40.4 09/03/2019 1138   PO2ART 88.0 09/03/2019 1138   HCO3 22.6 09/03/2019 1138   TCO2 24 09/03/2019 1138   ACIDBASEDEF 3.0 (H) 09/03/2019 1138   O2SAT 96.0 09/03/2019 1138     Coagulation Profile: Recent Labs  Lab 08/30/19 0418 08/31/19 0416 09/01/19 0705 09/05/2019 0414 09/03/19 0500  INR 8.0* 5.7* 6.5* 9.7* 6.2*    Cardiac Enzymes: No results for input(s): CKTOTAL, CKMB, CKMBINDEX, TROPONINI in the last 168 hours.  HbA1C: Hgb A1c MFr Bld  Date/Time Value Ref Range Status  08/29/2019 05:06 AM 5.9 (H) 4.8 - 5.6 % Final    Comment:    (NOTE) Pre diabetes:          5.7%-6.4% Diabetes:              >6.4% Glycemic control for   <7.0% adults with diabetes   04/05/2010 05:55 AM (H) <5.7 % Final   6.0 (NOTE)                                                                       According to the ADA Clinical Practice Recommendations for 2011, when HbA1c is used as a screening test:   >=6.5%   Diagnostic of Diabetes Mellitus           (if abnormal result  is confirmed)  5.7-6.4%   Increased risk of developing Diabetes Mellitus  References:Diagnosis and Classification of Diabetes  [place],Diabetes Care,2011,34(Suppl 1):S62-S69 and Standards of Medical Care in         Diabetes - 2011,Diabetes Care,2011,34  (Suppl 1):S11-S61.    CBG: Recent Labs  Lab 09/01/19 1139 09/05/2019 0759 08/14/2019 0949 09/03/19 0747 09/03/19 1201  GLUCAP 171* 160* 174* 161* 156*    Review of Systems:   Unable to be provided 2/2 intubated  Past Medical History  She,  has a past medical history of Asthma, Atrial fibrillation (HCC), Back pain, Bronchitis, CHF (congestive heart failure) (HCC), Chronic kidney disease, Chronic respiratory failure (HCC), COPD (chronic obstructive pulmonary disease) (HCC), Emphysema lung (HCC), and Hypertension.   Surgical History    Past Surgical History:  Procedure Laterality Date  . ABDOMINAL HYSTERECTOMY    . PARTIAL HIP ARTHROPLASTY Right   . TOTAL HIP ARTHROPLASTY Left   . TOTAL HIP REVISION Left 01/17/2017   Procedure: LEFT TOTAL HIP REVISION;  Surgeon: Samson Frederic, MD;  Location: [facility];  Service: Orthopedics;  Laterality: Left;     Social History   reports that she has never smoked. She has never used smokeless tobacco. She reports that she does not drink alcohol or use drugs.   Family History   Her family history includes Hypertension in her son.   Allergies Allergies  Allergen Reactions  . Amlodipine Swelling and Other (See Comments)    Reaction:  Lip/mouth swelling   . Hydralazine Swelling and Other (See Comments)  Reaction:  Lip/mouth swelling   . Iohexol Hives and Other (See Comments)    Desc: Pt developed hives after receiving iv dye for ct scan.  suggest she be premedicated for future exams, Onset Date: [id number]   . Metoprolol Swelling and Other (See Comments)    Reaction:  Lip/mouth swelling   . Olmesartan Swelling and Other (See Comments)    Reaction:  Lip/mouth swelling   . Other Swelling    Lip/mouth swelling  . Ramipril Swelling and Other (See Comments)    Reaction:  Lip/mouth swelling   . Risperidone Other (See  Comments)  . Sulfa Antibiotics Swelling and Other (See Comments)    Reaction:  Lip/mouth swelling  angioedema  . Telmisartan Swelling and Other (See Comments)    Reaction:  Lip/mouth swelling   . Tiotropium Swelling and Other (See Comments)    Reaction:  Lip/mouth swelling      Home Medications  Prior to Admission medications   Medication Sig Start Date End Date Taking? Authorizing Provider  ALPRAZolam (XANAX) 0.25 MG tablet Take 0.25 mg by mouth every 8 (eight) hours as needed for anxiety.    Yes [provider]  aspirin EC 81 MG tablet Take 81 mg by mouth daily.   Yes [provider]  atenolol (TENORMIN) 25 MG tablet Take 25 mg by mouth daily.    Yes [provider]  atorvastatin (LIPITOR) 20 MG tablet Take 20 mg by mouth at bedtime.    Yes [provider]  cholecalciferol (VITAMIN D) 1000 units tablet Take 2,000 Units by mouth daily.   Yes [provider]  clonazePAM (KLONOPIN) 1 MG tablet Take 0.5 mg by mouth 2 (two) times daily.    Yes [provider]  diltiazem (DILACOR XR) 120 MG 24 hr capsule Take 120 mg by mouth daily.   Yes [provider]  docusate sodium (COLACE) 100 MG capsule Take 100 mg by mouth daily as needed for mild constipation.    Yes [provider]  escitalopram (LEXAPRO) 20 MG tablet Take 20 mg by mouth daily.   Yes [provider]  Esomeprazole Magnesium 20 MG TBEC Take 20 mg by mouth See admin instructions. Take 1 tablet (20mg ) by mouth daily and take 1 additional tablet (20mg ) by mouth daily as needed for heartburn/acid reflux symptoms   Yes [provider]  ferrous gluconate (FERGON) 240 (27 FE) MG tablet Take 240 mg by mouth daily.   Yes [provider]  fluticasone furoate-vilanterol (BREO ELLIPTA) 100-25 MCG/INH AEPB Inhale 1 puff into the lungs daily. 03/27/16  Yes [provider]  furosemide (LASIX) 40 MG tablet Take 1 tablet (40 mg total) by mouth every  other day. Patient taking differently: Take 40 mg by mouth See admin instructions. Take 1 tablet (40mg ) by mouth every Sunday, Tuesday, Thursday and Saturday morning 01/21/16  Yes Enedina FinnerPatel, Sona, MD  Ipratropium-Albuterol (COMBIVENT) 20-100 MCG/ACT AERS respimat Inhale 1 puff into the lungs 4 (four) times daily as needed for wheezing or shortness of breath.    Yes [provider]  levothyroxine (SYNTHROID, LEVOTHROID) 50 MCG tablet Take 50-75 mcg by mouth See admin instructions. Take 1 tablet (50mcg) by mouth every Tuesday, Wednesday, Friday, Saturday and Sunday morning and take 1 tablets (75mcg) by mouth every Monday and Thursday morning   Yes [provider]  metolazone (ZAROXOLYN) 2.5 MG tablet Take 2.5 mg by mouth See admin instructions. Take 1 tablet (2.5mg ) by mouth every Monday, Thursday   Yes  [provider]  montelukast (SINGULAIR) 10 MG tablet Take 10 mg by mouth at bedtime.   Yes [provider]  Multiple Vitamins-Minerals (CENTRUM SILVER PO) Take 1 tablet by mouth daily.   Yes [provider]  potassium chloride SA (K-DUR,KLOR-CON) 20 MEQ tablet Take 1 tablet (20 mEq total) by mouth every other day. Patient taking differently: Take 10-20 mEq by mouth See admin instructions. Take  tablet ( ) by mouth every morning and take 1 tablet ( ) by mouth every evening 01/21/16  Yes Enedina Finner, MD  predniSONE (DELTASONE) 5 MG tablet Take 5 mg by mouth See admin instructions. Taking 5mg  on Sun, Tues, Thur, Sat.   Yes [provider]  spironolactone (ALDACTONE) 25 MG tablet Take 25 mg by mouth See admin instructions. Take 1 tablet (25mg ) by mouth every Monday and Thursday morning 04/07/19  Yes [provider]  tamsulosin (FLOMAX) 0.4 MG CAPS capsule Take 0.4 mg by mouth daily.    Yes [provider]  warfarin (COUMADIN) 2 MG tablet Take 2 mg by mouth See admin instructions. Taking 2mg  on Monday and Thursday, all other days 1/2  (1mg ) daily, unless directed by clinic 12/12/16  Yes [provider]  amiodarone (NEXTERONE PREMIX) 360-4.14 MG/200ML-% SOLN Inject 30 mg/hr into the vein continuous. 08/28/2019   , MD  dexamethasone (DECADRON) 10 MG/ML injection Inject 0.6 mLs (6 mg total) into the vein daily. 08/30/2019   01-31-1980, MD  vitamin C (VITAMIN C) 500 MG tablet Take 1 tablet (500 mg total) by mouth daily. 08/23/2019   Delfino Lovett, MD  zinc sulfate 220 (50 Zn) MG capsule Take 1 capsule (220 mg total) by mouth daily. 09/01/2019   Delfino Lovett, MD     Critical care time: 55 minutes    09/04/19, DO 09/03/19 2:57 PM [facility]    Family updated via phone.  Patient examined- bruising on right flank looks superficial, and the patient is endorsing a fall to her right side previously. Family indicated she had fallen and hit her head and back PTA.  09/04/19, DO 09/03/19 4:42 PM [facility]

## 2019-09-03 NOTE — Progress Notes (Signed)
CRITICAL VALUE ALERT  Critical Value:  INR = 6.2  Date & Time Notied:  09/03/19 @ 5456  Provider Notified: Carlis Abbott, MD  Orders Received/Actions taken: MD to assess at bedside rounds.

## 2019-09-03 NOTE — Progress Notes (Signed)
eLink Physician-Brief Progress Note Patient Name: Maureen Wilson DOB: [birth year] MRN: [id number]   Date of Service  09/03/2019  HPI/Events of Note  Poor urine output, Afib with RVR.  eICU Interventions  Increase amiodarone to 60, 250 ml iv LR fluid bolus x 1        Okoronkwo U Ogan 09/03/2019, 3:14 AM

## 2019-09-03 NOTE — Progress Notes (Signed)
ABG results verbally given to Dr. Carlis Abbott. No new orders at this time. RT will continue to monitor.

## 2019-09-04 DIAGNOSIS — I5033 Acute on chronic diastolic (congestive) heart failure: Secondary | ICD-10-CM

## 2019-09-04 LAB — FERRITIN: Ferritin: 167 ng/mL (ref 11–307)

## 2019-09-04 LAB — GLUCOSE, CAPILLARY
Glucose-Capillary: 115 mg/dL — ABNORMAL HIGH (ref 70–99)
Glucose-Capillary: 120 mg/dL — ABNORMAL HIGH (ref 70–99)
Glucose-Capillary: 130 mg/dL — ABNORMAL HIGH (ref 70–99)
Glucose-Capillary: 146 mg/dL — ABNORMAL HIGH (ref 70–99)
Glucose-Capillary: 148 mg/dL — ABNORMAL HIGH (ref 70–99)
Glucose-Capillary: 161 mg/dL — ABNORMAL HIGH (ref 70–99)

## 2019-09-04 LAB — POCT I-STAT 7, (LYTES, BLD GAS, ICA,H+H)
Acid-base deficit: 3 mmol/L — ABNORMAL HIGH (ref 0.0–2.0)
Bicarbonate: 23 mmol/L (ref 20.0–28.0)
Calcium, Ion: 1.25 mmol/L (ref 1.15–1.40)
HCT: 32 % — ABNORMAL LOW (ref 36.0–46.0)
Hemoglobin: 10.9 g/dL — ABNORMAL LOW (ref 12.0–15.0)
O2 Saturation: 100 %
Patient temperature: 98.4
Potassium: 3.9 mmol/L (ref 3.5–5.1)
Sodium: 126 mmol/L — ABNORMAL LOW (ref 135–145)
TCO2: 24 mmol/L (ref 22–32)
pCO2 arterial: 43.5 mmHg (ref 32.0–48.0)
pH, Arterial: 7.33 — ABNORMAL LOW (ref 7.350–7.450)
pO2, Arterial: 227 mmHg — ABNORMAL HIGH (ref 83.0–108.0)

## 2019-09-04 LAB — BASIC METABOLIC PANEL
Anion gap: 10 (ref 5–15)
BUN: 48 mg/dL — ABNORMAL HIGH (ref 8–23)
CO2: 23 mmol/L (ref 22–32)
Calcium: 8.3 mg/dL — ABNORMAL LOW (ref 8.9–10.3)
Chloride: 95 mmol/L — ABNORMAL LOW (ref 98–111)
Creatinine, Ser: 2.55 mg/dL — ABNORMAL HIGH (ref 0.44–1.00)
GFR calc Af Amer: 20 mL/min — ABNORMAL LOW (ref >60)
GFR calc non Af Amer: 17 mL/min — ABNORMAL LOW (ref >60)
Glucose, Bld: 162 mg/dL — ABNORMAL HIGH (ref 70–99)
Potassium: 4 mmol/L (ref 3.5–5.1)
Sodium: 128 mmol/L — ABNORMAL LOW (ref 135–145)

## 2019-09-04 LAB — MAGNESIUM: Magnesium: 1.8 mg/dL (ref 1.7–2.4)

## 2019-09-04 LAB — PROTIME-INR
INR: 3.8 — ABNORMAL HIGH (ref 0.8–1.2)
Prothrombin Time: 37.3 seconds — ABNORMAL HIGH (ref 11.4–15.2)

## 2019-09-04 LAB — C-REACTIVE PROTEIN: CRP: 6.2 mg/dL — ABNORMAL HIGH (ref <1.0)

## 2019-09-04 LAB — D-DIMER, QUANTITATIVE: D-Dimer, Quant: 0.41 ug/mL-FEU (ref 0.00–0.50)

## 2019-09-04 LAB — PHOSPHORUS: Phosphorus: 3.6 mg/dL (ref 2.5–4.6)

## 2019-09-04 MED ORDER — AMIODARONE HCL IN DEXTROSE 360-4.14 MG/200ML-% IV SOLN: 30.0000 mg/h | INTRAVENOUS | Status: DC

## 2019-09-04 MED ORDER — VITAL AF 1.2 CAL PO LIQD
1000.0000 mL | ORAL | Status: DC
  Administered 2019-09-04 – 2019-09-05 (×2): 1000 mL

## 2019-09-04 MED ORDER — FUROSEMIDE 10 MG/ML IJ SOLN
20.0000 mg | Freq: Once | INTRAMUSCULAR | Status: AC
  Administered 2019-09-04: 20 mg via INTRAVENOUS
  Filled 2019-09-04: qty 2

## 2019-09-04 MED ORDER — AMIODARONE HCL IN DEXTROSE 360-4.14 MG/200ML-% IV SOLN
30.0000 mg/h | INTRAVENOUS | Status: DC
  Filled 2019-09-04: qty 200

## 2019-09-04 MED ORDER — AMIODARONE HCL 200 MG PO TABS
200.0000 mg | ORAL_TABLET | Freq: Two times a day (BID) | ORAL | Status: DC
  Administered 2019-09-04 – 2019-09-06 (×5): 200 mg via ORAL
  Filled 2019-09-04 (×5): qty 1

## 2019-09-04 MED ORDER — FUROSEMIDE 10 MG/ML IJ SOLN
80.0000 mg | Freq: Four times a day (QID) | INTRAMUSCULAR | Status: AC
  Administered 2019-09-04 (×2): 80 mg via INTRAVENOUS
  Filled 2019-09-04 (×2): qty 8

## 2019-09-04 MED ORDER — CHLORHEXIDINE GLUCONATE CLOTH 2 % EX PADS
6.0000 | MEDICATED_PAD | Freq: Every day | CUTANEOUS | Status: DC
  Administered 2019-09-05 (×2): 6 via TOPICAL

## 2019-09-04 NOTE — Progress Notes (Signed)
eLink Physician-Brief Progress Note Patient Name: Maureen Wilson DOB: April 19, 1941 MRN: [id number]   Date of Service  09/04/2019  HPI/Events of Note  Notified of agitation despite Fentanyl 200 and prn Versed 1 mg q 1  eICU Interventions  Ordered to increase Fentanyl titration and assess and response     Intervention Category Minor Interventions: Agitation / anxiety - evaluation and management  Judd Lien 09/04/2019, 10:09 PM

## 2019-09-04 NOTE — Progress Notes (Signed)
Spoke with Kathlee Nations, RN at [facility] and discussed lasix order and BP of 92/62 with MAP of 72.  Nurse spoke with Dr. Lucile Shutters and MD gave order to go ahead and give lasix and to monitor patient's blood pressure.

## 2019-09-04 NOTE — Progress Notes (Signed)
Decreased based on ABG result

## 2019-09-04 NOTE — Progress Notes (Signed)
eLink Physician-Brief Progress Note Patient Name: Maureen Wilson DOB: 1941/05/10 MRN: [id number]   Date of Service  09/04/2019  HPI/Events of Note  Pt with oliguria.  eICU Interventions  Lasix 20 mg iv x 1        Okoronkwo U Ogan 09/04/2019, 3:25 AM

## 2019-09-04 NOTE — Progress Notes (Signed)
NAME:  Candace Begue, MRN:  [id number], DOB:  10/18/1940, LOS: 2 ADMISSION DATE:  2019-10-02, CONSULTATION DATE:  10/02/2019 REFERRING MD:  Posey Pronto, CHIEF COMPLAINT:  Respiratory failure   Brief History   COVID-19 pneumonia, progressive hypoxia necessitating intubation after 4 days on HFNC due to work of breathing.  History of present illness   Maureen Wilson is a 78 y.o. female with medical history significant of chronic A. fib, COPD, HTN, who was being treated for SARS-CoV-2 infection at [facility] when she went into acute respiratory failure with hypoxia requiring ET intubation, she was also found to be in A. fib with RVR.  Patient is currently intubated and unable to provide history, no family is available at this time, history obtained from reviewing the medical chart.  According to the previous H&P, she developed nausea, vomiting and generalized weakness on 08/22/2019, toes progressively worsened prompting her to seek medical attention on 08/29/2019.  In the ER she was noted to have acute respiratory failure with hypoxia secondary to Covid pneumonia, she was admitted at [facility].  She was started on remdesivir and Decadron, she is stabilized on 6 to 8 L/min [place] O2.  Rapid response was called 10/02/2019 where she was noted to have increasing work of breathing and subsequently intubated.  She has chronic A. fib, she went into A. fib with RVR and was started on amiodarone.  Past Medical History  COPD (no PFTs) Afib CKD HTN  Significant Hospital Events   Intubated 11/25, subsequently transferred to [place]:  PCCM  Procedures:  ETT 11/25  RIJ CVC 11/25  Significant Diagnostic Tests:  11/25 CXR-ET tube 2 cm above the carina, diffuse bilateral infiltrates, tented hemidiaphragms  Micro Data:  covid + Urine 11/21 E. Coli (resistant to ampicillin, Unasyn, ciprofloxacin) Blood 11/21>> NG  Antimicrobials:  Ceftriaxone 11/20, 11/22- 11/23 Remdesivir 11/21-11/25 Dexamethasone  11/22>>  Interim history/subjective:  Had variable oxygen requirements overnight.  Her husband is admitted to the hospital and not doing well due to Covid, is likely to pass away within the next day.  Objective   Blood pressure (!) 107/59, pulse 100, temperature 97.7 F (36.5 C), temperature source Axillary, resp. rate (!) 21, weight 94.7 kg, SpO2 (!) 83 %.    Vent Mode: PRVC FiO2 (%):  [50 %-100 %] 100 % Set Rate:  [22 bmp] 22 bmp Vt Set:  [420 mL] 420 mL PEEP:  [10 cmH20-14 cmH20] 10 cmH20 Plateau Pressure:  [23 cmH20-34 cmH20] 27 cmH20   Intake/Output Summary (Last 24 hours) at 09/04/2019 1600 Last data filed at 09/04/2019 1200 Gross per 24 hour  Intake 2170.19 ml  Output 160 ml  Net 2010.19 ml   Filed Weights   09/03/19 0500  Weight: 94.7 kg    Examination: General: Elderly woman lying in bed intubated, lightly sedated.   HENT: Unchanged bruising around her right eye, eyes anicteric.  ETT in place. Lungs: Breathing comfortably on the vent, CTAB.  No significant secretions from ET tube Cardiovascular: Minimally tachycardic, regular rhythm Abdomen: Obese, soft, nondistended Extremities: Pitting edema in the lower extremities.  Bruising on right flank, right arm, right leg-similar to previous exams Neuro: Opens eyes spontaneously, but not responsive to verbal stimulation without physical stimulation.  Sluggishly follows commands to wiggle toes and squeeze fists, globally weak.  Strong cough reflex. GU: Clear yellow urine in Foley  Resolved Hospital Problem list     Assessment & Plan:   Acute hypoxic vent dependent respiratory failure due to ARDS from  severe COVID-19 viral pneumonia -Continue LTV V, 4 to 8 cc/kg ideal body weight with goal plateau less than 30 and driving pressure less than 15.  Follow ARDS ladder for PEEP and FiO2 titration.  Saturations not always reliable. -Daily SAT and SBT when appropriate-failed SBT due to tachypnea today -Goal RASS 0 to -1, on  fentanyl and Versed  Continue low tidal volume ventilation, 48 cc KG ideal body weight with goal plateau less than 30 and driving pressure less than 15.  Follow ARDSNet ladder for PEEP and FiO2 titration.  Titrate down as able. -Daily SAT and SBT when appropriate -Goal RASS 0 to -1; fentanyl and Versed for sedation -VAP prevention protocol -con't dexamethasone; previously completed remdesivir -not requiring proning  Supratherapeutic INR no evidence of bleeding; on Coumadin prior to admission -con't to monitor INR -serial CBCs  Hyponatremia, suspect hypervolemic given lack of response. Normal osm. -Lasix x2 doses -Continue to monitor  Oliguric AKI; nonresponsive to low-dose Lasix overnight -Lasix 80 mg x 2 doses today -Monitor I/O -Renally dose medications -Avoid nephrotoxic meds -Continue to monitor  Chronic atrial fibrillation; RVR with agitation, but otherwise rate controlled in the low 100s  -Continue to monitor INR -Rate control with amiodarone-switching to oral -Can add metoprolol if rate may remains uncontrolled    Best practice:  Diet: tube feeds Pain/Anxiety/Delirium protocol (if indicated): ordered VAP protocol (if indicated): ordered DVT prophylaxis: supratherapeutic warfarin  GI prophylaxis: pepcid Glucose control: SSI Mobility: bedrest Code Status: full Family Communication: son & daughter-in-law updated today Disposition: ICU  Labs   CBC: Recent Labs  Lab 08/29/19 0506  08/31/19 0416 09/01/19 0705 08/15/2019 0414 08/25/2019 1033 08/15/2019 2145 09/03/19 0459 09/03/19 1138 09/03/19 1506 09/04/19 0304  WBC 7.5   < > 5.9 9.2 8.8 7.9  --  11.3*  --  10.4  --   NEUTROABS 6.2  --  5.4 8.5* 8.2* 7.4  --   --   --   --   --   HGB 11.8*   < > 11.6* 11.6* 11.3* 11.0* 12.9 10.9* 11.2* 11.1* 10.9*  HCT 35.0*   < > 33.1* 33.2* 34.1* 32.5* 38.0 34.0* 33.0* 33.8* 32.0*  MCV 98.0   < > 93.2 93.3 98.3 97.0  --  102.1*  --  100.6*  --   PLT 124*   < > 108* 130* 150  151  --  157  --  183  --    < > = values in this interval not displayed.    Basic Metabolic Panel: Recent Labs  Lab 09/01/19 0705 08/20/2019 0414 09/01/2019 1033 09/05/2019 1707 08/30/2019 2145 09/03/19 0459 09/03/19 1138 09/04/19 0304 09/04/19 0450  NA 128* 127* 126*  --  125* 128* 126* 126* 128*  K 4.3 4.2 3.9  --  4.2 4.7 4.0 3.9 4.0  CL 95* 96* 92*  --   --  93*  --   --  95*  CO2 21* 20* 24  --   --  21*  --   --  23  GLUCOSE 172* 175* 172*  --   --  172*  --   --  162*  BUN 27* 27* 30*  --   --  37*  --   --  48*  CREATININE 1.23* 1.25* 1.25*  --   --  1.96*  --   --  2.55*  CALCIUM 8.1* 8.4* 8.1*  --   --  8.7*  --   --  8.3*  MG 2.0  1.8  --  1.9  --  1.8  --   --  1.8  PHOS  --   --   --  3.1  --  3.4  --   --  3.6   GFR: Estimated Creatinine Clearance: 19.9 mL/min (A) (by C-G formula based on SCr of 2.55 mg/dL (H)). Recent Labs  Lab 08/29/19 0506 08/29/19 0645  09/13/19 0414 2019/09/13 1033 09/03/19 0459 09/03/19 1506  PROCALCITON  --  <0.10  --   --   --   --   --   WBC 7.5  --    < > 8.8 7.9 11.3* 10.4  LATICACIDVEN 1.7  --   --   --  1.7  --   --    < > = values in this interval not displayed.    Liver Function Tests: Recent Labs  Lab 08/31/19 0416 09/01/19 0705 2019-09-13 0414 13-Sep-2019 1033 09/03/19 0459  AST 46* 52* 50* 45* 46*  ALT 21 21 22 23 22   ALKPHOS 58 63 64 68 76  BILITOT 0.9 1.0 0.9 1.1 1.1  PROT 5.7* 5.7* 5.7* 5.6* 6.0*  ALBUMIN 2.6* 2.6* 2.6* 2.6* 2.6*   No results for input(s): LIPASE, AMYLASE in the last 168 hours. No results for input(s): AMMONIA in the last 168 hours.  ABG    Component Value Date/Time   PHART 7.330 (L) 09/04/2019 0304   PCO2ART 43.5 09/04/2019 0304   PO2ART 227.0 (H) 09/04/2019 0304   HCO3 23.0 09/04/2019 0304   TCO2 24 09/04/2019 0304   ACIDBASEDEF 3.0 (H) 09/04/2019 0304   O2SAT 100.0 09/04/2019 0304     Coagulation Profile: Recent Labs  Lab 08/31/19 0416 09/01/19 0705 2019/09/13 0414 09/03/19 0500  09/04/19 0450  INR 5.7* 6.5* 9.7* 6.2* 3.8*    Cardiac Enzymes: No results for input(s): CKTOTAL, CKMB, CKMBINDEX, TROPONINI in the last 168 hours.  HbA1C: Hgb A1c MFr Bld  Date/Time Value Ref Range Status  08/29/2019 05:06 AM 5.9 (H) 4.8 - 5.6 % Final    Comment:    (NOTE) Pre diabetes:          5.7%-6.4% Diabetes:              >6.4% Glycemic control for   <7.0% adults with diabetes   04/05/2010 05:55 AM (H) <5.7 % Final   6.0 (NOTE)                                                                       According to the ADA Clinical Practice Recommendations for 2011, when HbA1c is used as a screening test:   >=6.5%   Diagnostic of Diabetes Mellitus           (if abnormal result  is confirmed)  5.7-6.4%   Increased risk of developing Diabetes Mellitus  References:Diagnosis and Classification of Diabetes Mellitus,Diabetes Care,2011,34(Suppl 1):S62-S69 and Standards of Medical Care in         Diabetes - 2011,Diabetes Care,2011,34  (Suppl 1):S11-S61.    CBG: Recent Labs  Lab 09/04/19 0005 09/04/19 0341 09/04/19 0744 09/04/19 1228 09/04/19 1523  GLUCAP 115* 161* 130* 146* 148*       Critical care time: 50 minutes    08/28/2019, DO 09/04/19  4:00 PM [facility]

## 2019-09-04 NOTE — Progress Notes (Signed)
Initial Nutrition Assessment RD working remotely.  DOCUMENTATION CODES:   Obesity unspecified  INTERVENTION:    Vital AF 1.2 at 60 ml/h (1440 ml per day)   Provides 1728 kcal, 108 gm protein, 1168 ml free water daily  NUTRITION DIAGNOSIS:   Increased nutrient needs related to acute illness(COVID 19 PNA) as evidenced by estimated needs.  GOAL:   Patient will meet greater than or equal to 90% of their needs  MONITOR:   Vent status, TF tolerance, Skin, Labs  REASON FOR ASSESSMENT:   Ventilator, Consult Enteral/tube feeding initiation and management  ASSESSMENT:   78 yo female admitted with COVID-19 PNA. Required intubation 11/25. PMH includes COPD, A fib, CKD, HTN, CHF.   Received MD Consult for TF initiation and management. OG tube in place.  Not requiring proning at this time.  Patient is currently intubated on ventilator support MV: 8.4 L/min Temp (24hrs), Avg:98.2 F (36.8 C), Min:97.3 F (36.3 C), Max:98.8 F (37.1 C)   Labs reviewed. Sodium 128 (L), BUN 48 (H), creatinine 2.55 (H) CBG's: 115-161-130-146  Medications reviewed and include decadron, lasix, novolog.  I/O +4 L since admission Weight encounters reviewed. No significant weight changes over the past 3.5 months.   NUTRITION - FOCUSED PHYSICAL EXAM:  unable to complete  Diet Order:   Diet Order            Diet NPO time specified  Diet effective now              EDUCATION NEEDS:   Not appropriate for education at this time  Skin:  Skin Assessment: Reviewed RN Assessment  Last BM:  11/24  Height:   Ht Readings from Last 1 Encounters:  08/29/19 5\' 3"  (1.6 m)    Weight:   Wt Readings from Last 1 Encounters:  09/03/19 94.7 kg    Ideal Body Weight:  52.3 kg  BMI:  Body mass index is 36.98 kg/m.  Estimated Nutritional Needs:   Kcal:  1650-1850  Protein:  >/= 105 gm  Fluid:  >/= 1.7 L    Molli Barrows, RD, LDN, [place] Pager [phone] After Hours Pager  [phone]

## 2019-09-04 NOTE — Progress Notes (Signed)
Called [facility] and spoke with Dr. Lucile Shutters.  Made MD aware that patient has only had 75 cc of urine output out this shift so far and bladder scan result is 15 cc.  Also made MD aware that patient only had 85 cc UOP on day shift yesterday. MD acknowledged and stated he would look at patient's chart and review.

## 2019-09-04 NOTE — Plan of Care (Signed)
  Problem: Respiratory: Goal: Complications related to the disease process, condition or treatment will be avoided or minimized Outcome: Progressing   Problem: Education: Goal: Knowledge of risk factors and measures for prevention of condition will improve Outcome: Not Progressing   Problem: Coping: Goal: Psychosocial and spiritual needs will be supported Outcome: Not Progressing   Problem: Respiratory: Goal: Will maintain a patent airway Outcome: Not Progressing

## 2019-09-05 ENCOUNTER — Inpatient Hospital Stay (HOSPITAL_COMMUNITY): Payer: Medicare Other

## 2019-09-05 DIAGNOSIS — R34 Anuria and oliguria: Secondary | ICD-10-CM

## 2019-09-05 DIAGNOSIS — I4891 Unspecified atrial fibrillation: Secondary | ICD-10-CM

## 2019-09-05 LAB — BASIC METABOLIC PANEL
Anion gap: 13 (ref 5–15)
Anion gap: 15 (ref 5–15)
BUN: 77 mg/dL — ABNORMAL HIGH (ref 8–23)
BUN: 93 mg/dL — ABNORMAL HIGH (ref 8–23)
CO2: 22 mmol/L (ref 22–32)
CO2: 21 mmol/L — ABNORMAL LOW (ref 22–32)
Calcium: 8.5 mg/dL — ABNORMAL LOW (ref 8.9–10.3)
Calcium: 8.5 mg/dL — ABNORMAL LOW (ref 8.9–10.3)
Chloride: 93 mmol/L — ABNORMAL LOW (ref 98–111)
Chloride: 92 mmol/L — ABNORMAL LOW (ref 98–111)
Creatinine, Ser: 3.44 mg/dL — ABNORMAL HIGH (ref 0.44–1.00)
Creatinine, Ser: 4.11 mg/dL — ABNORMAL HIGH (ref 0.44–1.00)
GFR calc Af Amer: 14 mL/min — ABNORMAL LOW (ref >60)
GFR calc Af Amer: 11 mL/min — ABNORMAL LOW (ref >60)
GFR calc non Af Amer: 12 mL/min — ABNORMAL LOW (ref >60)
GFR calc non Af Amer: 10 mL/min — ABNORMAL LOW (ref >60)
Glucose, Bld: 198 mg/dL — ABNORMAL HIGH (ref 70–99)
Glucose, Bld: 82 mg/dL (ref 70–99)
Potassium: 4.1 mmol/L (ref 3.5–5.1)
Potassium: 4.5 mmol/L (ref 3.5–5.1)
Sodium: 128 mmol/L — ABNORMAL LOW (ref 135–145)
Sodium: 128 mmol/L — ABNORMAL LOW (ref 135–145)

## 2019-09-05 LAB — GLUCOSE, CAPILLARY
Glucose-Capillary: 104 mg/dL — ABNORMAL HIGH (ref 70–99)
Glucose-Capillary: 129 mg/dL — ABNORMAL HIGH (ref 70–99)
Glucose-Capillary: 211 mg/dL — ABNORMAL HIGH (ref 70–99)
Glucose-Capillary: 212 mg/dL — ABNORMAL HIGH (ref 70–99)
Glucose-Capillary: 245 mg/dL — ABNORMAL HIGH (ref 70–99)
Glucose-Capillary: 99 mg/dL (ref 70–99)

## 2019-09-05 LAB — URINALYSIS, MICROSCOPIC (REFLEX)

## 2019-09-05 LAB — TROPONIN I (HIGH SENSITIVITY)
Troponin I (High Sensitivity): 55 ng/L — ABNORMAL HIGH (ref <18)
Troponin I (High Sensitivity): 48 ng/L — ABNORMAL HIGH (ref <18)

## 2019-09-05 LAB — C-REACTIVE PROTEIN: CRP: 4.9 mg/dL — ABNORMAL HIGH (ref <1.0)

## 2019-09-05 LAB — PROTIME-INR
INR: 1.7 — ABNORMAL HIGH (ref 0.8–1.2)
Prothrombin Time: 20.3 seconds — ABNORMAL HIGH (ref 11.4–15.2)

## 2019-09-05 LAB — PHOSPHORUS: Phosphorus: 4.5 mg/dL (ref 2.5–4.6)

## 2019-09-05 LAB — URINALYSIS, ROUTINE W REFLEX MICROSCOPIC
Bilirubin Urine: NEGATIVE
Glucose, UA: NEGATIVE mg/dL
Nitrite: NEGATIVE
Specific Gravity, Urine: 1.03 (ref 1.005–1.030)
pH: 5 (ref 5.0–8.0)

## 2019-09-05 LAB — FERRITIN: Ferritin: 203 ng/mL (ref 11–307)

## 2019-09-05 LAB — MAGNESIUM
Magnesium: 1.9 mg/dL (ref 1.7–2.4)
Magnesium: 2.1 mg/dL (ref 1.7–2.4)

## 2019-09-05 LAB — D-DIMER, QUANTITATIVE: D-Dimer, Quant: 2.08 ug/mL-FEU — ABNORMAL HIGH (ref 0.00–0.50)

## 2019-09-05 LAB — CREATININE, URINE, RANDOM: Creatinine, Urine: 98.23 mg/dL

## 2019-09-05 LAB — SODIUM, URINE, RANDOM: Sodium, Ur: 70 mmol/L

## 2019-09-05 MED ORDER — INSULIN DETEMIR 100 UNIT/ML ~~LOC~~ SOLN
5.0000 [IU] | Freq: Two times a day (BID) | SUBCUTANEOUS | Status: DC
  Administered 2019-09-05 – 2019-09-06 (×3): 5 [IU] via SUBCUTANEOUS
  Filled 2019-09-05 (×4): qty 0.05

## 2019-09-05 MED ORDER — HEPARIN (PORCINE) 25000 UT/250ML-% IV SOLN
1000.0000 [IU]/h | INTRAVENOUS | Status: DC
  Administered 2019-09-05: 1000 [IU]/h via INTRAVENOUS
  Filled 2019-09-05: qty 250

## 2019-09-05 MED ORDER — DILTIAZEM HCL 30 MG PO TABS
30.0000 mg | ORAL_TABLET | Freq: Four times a day (QID) | ORAL | Status: DC
  Administered 2019-09-05 – 2019-09-06 (×4): 30 mg
  Filled 2019-09-05 (×8): qty 1

## 2019-09-05 MED ORDER — SODIUM CHLORIDE 0.9 % IV SOLN
250.0000 mL | INTRAVENOUS | Status: DC
  Administered 2019-09-05: 250 mL via INTRAVENOUS

## 2019-09-05 MED ORDER — NOREPINEPHRINE 4 MG/250ML-% IV SOLN
0.0000 ug/min | INTRAVENOUS | Status: DC
  Administered 2019-09-05: 2 ug/min via INTRAVENOUS

## 2019-09-05 MED ORDER — FAMOTIDINE 20 MG PO TABS
20.0000 mg | ORAL_TABLET | Freq: Every day | ORAL | Status: DC
  Administered 2019-09-06: 20 mg
  Filled 2019-09-05 (×2): qty 1

## 2019-09-05 MED ORDER — LEVOTHYROXINE SODIUM 50 MCG PO TABS
50.0000 ug | ORAL_TABLET | ORAL | Status: DC
  Administered 2019-09-06: 50 ug
  Filled 2019-09-05: qty 1

## 2019-09-05 MED ORDER — NOREPINEPHRINE 4 MG/250ML-% IV SOLN
2.0000 ug/min | INTRAVENOUS | Status: DC
  Filled 2019-09-05: qty 250

## 2019-09-05 NOTE — Progress Notes (Addendum)
eLink Physician-Brief Progress Note Patient Name: Kenora Spayd DOB: 13-Apr-1941 MRN: [id number]   Date of Service  09/05/2019  HPI/Events of Note  MAP 50's. Asking for pressor, has a central line.  Camera eval done; Covid PNA. 420/10 peep. On heparin gtt.   CxR from noon reviewed. No pneumo. Notes, meds reviewed.   eICU Interventions  - Levophed gtt ordered.  - consider ABG and CxR if not better.      Intervention Category Major Interventions: Hypotension - evaluation and management  Elmer Sow 09/05/2019, 9:20 PM   22:22 Called bed side RN. On low dose Levo at 3 mcg, doing good, MAP up. Vent in synchrony. Wean Vt as tolerated . Last P/F > 200.

## 2019-09-05 NOTE — Progress Notes (Signed)
ANTICOAGULATION CONSULT NOTE - Initial Consult  Pharmacy Consult for heparin Indication: atrial fibrillation  Allergies  Allergen Reactions  . Amlodipine Swelling and Other (See Comments)    Reaction:  Lip/mouth swelling   . Hydralazine Swelling and Other (See Comments)    Reaction:  Lip/mouth swelling   . Iohexol Hives and Other (See Comments)    Desc: Pt developed hives after receiving iv dye for ct scan.  suggest she be premedicated for future exams, Onset Date: [id number]   . Metoprolol Swelling and Other (See Comments)    Reaction:  Lip/mouth swelling   . Olmesartan Swelling and Other (See Comments)    Reaction:  Lip/mouth swelling   . Other Swelling    Lip/mouth swelling  . Ramipril Swelling and Other (See Comments)    Reaction:  Lip/mouth swelling   . Risperidone Other (See Comments)  . Sulfa Antibiotics Swelling and Other (See Comments)    Reaction:  Lip/mouth swelling  angioedema  . Telmisartan Swelling and Other (See Comments)    Reaction:  Lip/mouth swelling   . Tiotropium Swelling and Other (See Comments)    Reaction:  Lip/mouth swelling     Patient Measurements: Weight: 222 lb 0.1 oz (100.7 kg) Heparin Dosing Weight: 76 kg   Vital Signs: Temp: 98.4 F (36.9 C) (11/28 1139) Temp Source: Axillary (11/28 1139) BP: 111/59 (11/28 1400) Pulse Rate: 69 (11/28 1400)  Labs: Recent Labs    09/03/19 0459 09/03/19 0500 09/03/19 1138 09/03/19 1506 09/04/19 0304 09/04/19 0450 09/05/19 0405  HGB 10.9*  --  11.2* 11.1* 10.9*  --   --   HCT 34.0*  --  33.0* 33.8* 32.0*  --   --   PLT 157  --   --  183  --   --   --   LABPROT  --  54.8*  --   --   --  37.3* 20.3*  INR  --  6.2*  --   --   --  3.8* 1.7*  CREATININE 1.96*  --   --   --   --  2.55* 3.44*    Estimated Creatinine Clearance: 15.3 mL/min (A) (by C-G formula based on SCr of 3.44 mg/dL (H)).   Medical History: Past Medical History:  Diagnosis Date  . Asthma   . Atrial fibrillation (HCC)   . Back  pain   . Bronchitis    hx of  . CHF (congestive heart failure) (HCC)   . Chronic kidney disease   . Chronic respiratory failure (HCC)   . COPD (chronic obstructive pulmonary disease) (HCC)   . Emphysema lung (HCC)   . Hypertension     Medications:  Medications Prior to Admission  Medication Sig Dispense Refill Last Dose  . ALPRAZolam (XANAX) 0.25 MG tablet Take 0.25 mg by mouth every 8 (eight) hours as needed for anxiety.    unk  . aspirin EC 81 MG tablet Take 81 mg by mouth daily.   08/28/2019  . atenolol (TENORMIN) 25 MG tablet Take 25 mg by mouth daily.    08/28/2019 at 0900  . atorvastatin (LIPITOR) 20 MG tablet Take 20 mg by mouth at bedtime.    08/28/2019  . cholecalciferol (VITAMIN D) 1000 units tablet Take 2,000 Units by mouth daily.   08/28/2019  . clonazePAM (KLONOPIN) 1 MG tablet Take 0.5 mg by mouth 2 (two) times daily.    08/28/2019  . diltiazem (DILACOR XR) 120 MG 24 hr capsule Take 120 mg by mouth daily.  08/28/2019  . docusate sodium (COLACE) 100 MG capsule Take 100 mg by mouth daily as needed for mild constipation.    unk  . escitalopram (LEXAPRO) 20 MG tablet Take 20 mg by mouth daily.   08/28/2019  . Esomeprazole Magnesium 20 MG TBEC Take 20 mg by mouth See admin instructions. Take 1 tablet (20mg ) by mouth daily and take 1 additional tablet (20mg ) by mouth daily as needed for heartburn/acid reflux symptoms   08/28/2019  . ferrous gluconate (FERGON) 240 (27 FE) MG tablet Take 240 mg by mouth daily.   08/28/2019  . fluticasone furoate-vilanterol (BREO ELLIPTA) 100-25 MCG/INH AEPB Inhale 1 puff into the lungs daily.   08/28/2019  . furosemide (LASIX) 40 MG tablet Take 1 tablet (40 mg total) by mouth every other day. (Patient taking differently: Take 40 mg by mouth See admin instructions. Take 1 tablet (40mg ) by mouth every Sunday, Tuesday, Thursday and Saturday morning) 30 tablet 0 08/28/2019  . Ipratropium-Albuterol (COMBIVENT) 20-100 MCG/ACT AERS respimat Inhale 1 puff  into the lungs 4 (four) times daily as needed for wheezing or shortness of breath.    unk  . levothyroxine (SYNTHROID, LEVOTHROID) 50 MCG tablet Take 50-75 mcg by mouth See admin instructions. Take 1 tablet (50mcg) by mouth every Tuesday, Wednesday, Friday, Saturday and Sunday morning and take 1 tablets (75mcg) by mouth every Monday and Thursday morning   08/27/2019  . metolazone (ZAROXOLYN) 2.5 MG tablet Take 2.5 mg by mouth See admin instructions. Take 1 tablet (2.5mg ) by mouth every Monday, Thursday   08/27/2019  . montelukast (SINGULAIR) 10 MG tablet Take 10 mg by mouth at bedtime.   08/27/2019  . Multiple Vitamins-Minerals (CENTRUM SILVER PO) Take 1 tablet by mouth daily.   08/27/2019  . potassium chloride SA (K-DUR,KLOR-CON) 20 MEQ tablet Take 1 tablet (20 mEq total) by mouth every other day. (Patient taking differently: Take 10-20 mEq by mouth See admin instructions. Take  tablet (10meq) by mouth every morning and take 1 tablet (20meq) by mouth every evening) 30 tablet 0 08/27/2019  . predniSONE (DELTASONE) 5 MG tablet Take 5 mg by mouth See admin instructions. Taking 5mg  on Sun, Tues, Thur, Sat.   08/27/2019  . spironolactone (ALDACTONE) 25 MG tablet Take 25 mg by mouth See admin instructions. Take 1 tablet (25mg ) by mouth every Monday and Thursday morning   08/27/2019  . tamsulosin (FLOMAX) 0.4 MG CAPS capsule Take 0.4 mg by mouth daily.    08/28/2019  . warfarin (COUMADIN) 2 MG tablet Take 2 mg by mouth See admin instructions. Taking 2mg  on Monday and Thursday, all other days 1/2 (1mg ) daily, unless directed by clinic   08/28/2019  . amiodarone (NEXTERONE PREMIX) 360-4.14 MG/200ML-% SOLN Inject 30 mg/hr into the vein continuous.     Marland Kitchen. dexamethasone (DECADRON) 10 MG/ML injection Inject 0.6 mLs (6 mg total) into the vein daily. 1 mL 0   . vitamin C (VITAMIN C) 500 MG tablet Take 1 tablet (500 mg total) by mouth daily.     Marland Kitchen. zinc sulfate 220 (50 Zn) MG capsule Take 1 capsule (220 mg total) by  mouth daily.       Assessment: [age] YOF with h/o of Afib on warfarin prior to admission. INR on admission was supratherapeutic at 9.7. INR has trended down to 1.7 today after 2 doses of Vit K. Pharmacy consulted to start IV heparin.   H/H low stable. Plt wnl. SCr worsening.   Goal of Therapy:  Heparin level 0.3-0.7 units/ml  Monitor platelets by anticoagulation protocol: Yes   Plan:  Start IV heparin at 1000 units/hr F/u 8 hr HL  Monitor daily HL, CBC and s/s of bleeding   Albertina Parr, PharmD., BCPS Clinical Pharmacist Clinical phone for 09/05/19 until 5pm: [phone]

## 2019-09-05 NOTE — Progress Notes (Addendum)
Spoke with Levada Dy RN and Dr Renaldo Harrison via telephone re consult to place 2 PIV's and d/c CVC.  Dr Carlis Abbott states to wait to proceed with this until 08/28/2019.  Please place consult if IV services are needed in am.

## 2019-09-05 NOTE — Progress Notes (Signed)
Spoke to patient's son and gave updates on patient condition. Answered all questions regarding patient's plan of care.

## 2019-09-05 NOTE — Consult Note (Addendum)
Renal Service Consult Note [facility]  Maureen Wilson 09/05/2019 [place] Requesting Physician:  Dr Noemi Chapel  Reason for Consult:  Renal failure HPI: The patient is a 78 y.o. year-old with hx of HTN, COPD, CKD 3, CHF and atrial fib who was at [facility] in hospital admitted 08/29/19 for COVID infection, who then developed resp failure and was intubated on 09/01/2019 and was subsequently tx'd to [facility].  Creat on admit was 1.3 and today is up to 4.5.  Asked to see for renal failure.   Pt is intubated and sedated, no hx provided.    Chart shows hx of stage III CKD dating back several years.   Here BP's have been normal to low since admission. UOP has been poor since admission < 300 cc/day.  Today BP's dropped and pt started on pressors. I/O's are 6.7 L +    Past Medical History  Past Medical History:  Diagnosis Date  . Asthma   . Atrial fibrillation ([place])   . Back pain   . Bronchitis    hx of  . CHF (congestive heart failure) ([place])   . Chronic kidney disease   . Chronic respiratory failure ([place])   . COPD (chronic obstructive pulmonary disease) ([place])   . Emphysema lung ([place])   . Hypertension    Past Surgical History  Past Surgical History:  Procedure Laterality Date  . ABDOMINAL HYSTERECTOMY    . PARTIAL HIP ARTHROPLASTY Right   . TOTAL HIP ARTHROPLASTY Left   . TOTAL HIP REVISION Left 01/17/2017   Procedure: LEFT TOTAL HIP REVISION;  Surgeon: Rod Can, MD;  Location: [facility] ORS;  Service: Orthopedics;  Laterality: Left;   Family History  Family History  Problem Relation Age of Onset  . Hypertension Son    Social History  reports that she has never smoked. She has never used smokeless tobacco. She reports that she does not drink alcohol or use drugs. Allergies  Allergies  Allergen Reactions  . Amlodipine Swelling and Other (See Comments)    Reaction:  Lip/mouth swelling   . Hydralazine Swelling and Other (See Comments)    Reaction:   Lip/mouth swelling   . Iohexol Hives and Other (See Comments)    Desc: Pt developed hives after receiving iv dye for ct scan.  suggest she be premedicated for future exams, Onset Date: [id number]   . Metoprolol Swelling and Other (See Comments)    Reaction:  Lip/mouth swelling   . Olmesartan Swelling and Other (See Comments)    Reaction:  Lip/mouth swelling   . Other Swelling    Lip/mouth swelling  . Ramipril Swelling and Other (See Comments)    Reaction:  Lip/mouth swelling   . Risperidone Other (See Comments)  . Sulfa Antibiotics Swelling and Other (See Comments)    Reaction:  Lip/mouth swelling  angioedema  . Telmisartan Swelling and Other (See Comments)    Reaction:  Lip/mouth swelling   . Tiotropium Swelling and Other (See Comments)    Reaction:  Lip/mouth swelling    Home medications Prior to Admission medications   Medication Sig Start Date End Date Taking? Authorizing Provider  ALPRAZolam (XANAX) 0.25 MG tablet Take 0.25 mg by mouth every 8 (eight) hours as needed for anxiety.    Yes [provider]  aspirin EC 81 MG tablet Take 81 mg by mouth daily.   Yes [provider]  atenolol (TENORMIN) 25 MG tablet Take 25 mg by mouth daily.    Yes  [provider]  atorvastatin (LIPITOR) 20 MG tablet Take 20 mg by mouth at bedtime.    Yes [provider]  cholecalciferol (VITAMIN D) 1000 units tablet Take 2,000 Units by mouth daily.   Yes [provider]  clonazePAM (KLONOPIN) 1 MG tablet Take 0.5 mg by mouth 2 (two) times daily.    Yes [provider]  diltiazem (DILACOR XR) 120 MG 24 hr capsule Take 120 mg by mouth daily.   Yes [provider]  docusate sodium (COLACE) 100 MG capsule Take 100 mg by mouth daily as needed for mild constipation.    Yes [provider]  escitalopram (LEXAPRO) 20 MG tablet Take 20 mg by mouth daily.   Yes [provider]  Esomeprazole Magnesium 20 MG TBEC Take 20 mg by mouth  See admin instructions. Take 1 tablet (63m) by mouth daily and take 1 additional tablet (232m by mouth daily as needed for heartburn/acid reflux symptoms   Yes [provider]  ferrous gluconate (FERGON) 240 (27 FE) MG tablet Take 240 mg by mouth daily.   Yes [provider]  fluticasone furoate-vilanterol (BREO ELLIPTA) 100-25 MCG/INH AEPB Inhale 1 puff into the lungs daily. 03/27/16  Yes [provider]  furosemide (LASIX) 40 MG tablet Take 1 tablet (40 mg total) by mouth every other day. Patient taking differently: Take 40 mg by mouth See admin instructions. Take 1 tablet (4010mby mouth every Sunday, Tuesday, Thursday and Saturday morning 01/21/16  Yes PatFritzi MandesD  Ipratropium-Albuterol (COMBIVENT) 20-100 MCG/ACT AERS respimat Inhale 1 puff into the lungs 4 (four) times daily as needed for wheezing or shortness of breath.    Yes [provider]  levothyroxine (SYNTHROID, LEVOTHROID) 50 MCG tablet Take 50-75 mcg by mouth See admin instructions. Take 1 tablet (68m60mby mouth every Tuesday, Wednesday, Friday, Saturday and Sunday morning and take 1 tablets (75mc29my mouth every Monday and Thursday morning   Yes [provider]  metolazone (ZAROXOLYN) 2.5 MG tablet Take 2.5 mg by mouth See admin instructions. Take 1 tablet (2.5mg) 35mmouth every Monday, Thursday   Yes [provider]  montelukast (SINGULAIR) 10 MG tablet Take 10 mg by mouth at bedtime.   Yes [provider]  Multiple Vitamins-Minerals (CENTRUM SILVER PO) Take 1 tablet by mouth daily.   Yes [provider]  potassium chloride SA (K-DUR,KLOR-CON) 20 MEQ tablet Take 1 tablet (20 mEq total) by mouth every other day. Patient taking differently: Take 10-20 mEq by mouth See admin instructions. Take  tablet (10meq)53mmouth every morning and take 1 tablet (20meq) 17mouth every evening 01/21/16  Yes Patel, SFritzi MandesedniSONE (DELTASONE) 5 MG tablet Take 5 mg by mouth  See admin instructions. Taking 5mg on S21m Tues, Thur, Sat.   Yes [provider]  spironolactone (ALDACTONE) 25 MG tablet Take 25 mg by mouth See admin instructions. Take 1 tablet (25mg) by 79mh every Monday and Thursday morning 04/07/19  Yes [provider]  tamsulosin (FLOMAX) 0.4 MG CAPS capsule Take 0.4 mg by mouth daily.    Yes [provider]  warfarin (COUMADIN) 2 MG tablet Take 2 mg by mouth See admin instructions. Taking 2mg on Mon23m and Thursday, all other days 1/2 (1mg) daily,64mless directed by clinic 12/12/16  Yes [provider]  amiodarone (NEXTERONE PREMIX) 360-4.14 MG/200ML-% SOLN Inject 30 mg/hr into the vein continuous. 08/09/2019   Shah, Vipul,Max Sanethasone (DECADRON) 10 MG/ML injection Inject  0.6 mLs (6 mg total) into the vein daily. 09/01/2019   Max Sane, MD  vitamin C (VITAMIN C) 500 MG tablet Take 1 tablet (500 mg total) by mouth daily. 08/18/2019   Max Sane, MD  zinc sulfate 220 (50 Zn) MG capsule Take 1 capsule (220 mg total) by mouth daily. 08/31/2019   Max Sane, MD   Liver Function Tests Recent Labs  Lab 08/27/2019 0414 08/14/2019 1033 09/03/19 0459  AST 50* 45* 46*  ALT _0 ALKPHOS 64 68 76  BILITOT 0.9 1.1 1.1  PROT 5.7* 5.6* 6.0*  ALBUMIN 2.6* 2.6* 2.6*   No results for input(s): LIPASE, AMYLASE in the last 168 hours. CBC Recent Labs  Lab 09/01/19 0705 08/20/2019 0414 08/21/2019 1033  09/03/19 0459 09/03/19 1138 09/03/19 1506 09/04/19 0304  WBC 9.2 8.8 7.9  --  11.3*  --  10.4  --   NEUTROABS 8.5* 8.2* 7.4  --   --   --   --   --   HGB 11.6* 11.3* 11.0*   < > 10.9* 11.2* 11.1* 10.9*  HCT 33.2* 34.1* 32.5*   < > 34.0* 33.0* 33.8* 32.0*  MCV 93.3 98.3 97.0  --  102.1*  --  100.6*  --   PLT 130* 150 151  --  157  --  183  --    < > = values in this interval not displayed.   Basic Metabolic Panel Recent Labs  Lab 09/01/19 0705 08/14/2019 0414 08/17/2019 1033 08/24/2019 1707 08/26/2019 2145 09/03/19 0459  09/03/19 1138 09/04/19 0304 09/04/19 0450 09/05/19 0405 09/05/19 1820  NA 128* 127* 126*  --  125* 128* 126* 126* 128* 128* 128*  K 4.3 4.2 3.9  --  4.2 4.7 4.0 3.9 4.0 4.1 4.5  CL 95* 96* 92*  --   --  93*  --   --  95* 93* 92*  CO2 21* 20* 24  --   --  21*  --   --  23 22 21*  GLUCOSE 172* 175* 172*  --   --  172*  --   --  162* 198* 82  BUN 27* 27* 30*  --   --  37*  --   --  48* 77* 93*  CREATININE 1.23* 1.25* 1.25*  --   --  1.96*  --   --  2.55* 3.44* 4.11*  CALCIUM 8.1* 8.4* 8.1*  --   --  8.7*  --   --  8.3* 8.5* 8.5*  PHOS  --   --   --  3.1  --  3.4  --   --  3.6 4.5  --    Iron/TIBC/Ferritin/ %Sat    Component Value Date/Time   FERRITIN 203 09/05/2019 0405    Vitals:   09/05/19 1800 09/05/19 1900 09/05/19 1926 09/05/19 2000  BP: 116/78 (!) 105/49 (!) 105/49 (!) 97/59  Pulse: (!) 56 (!) 37 (!) 120 (!) 111  Resp: 17 (!) 21 (!) 25 (!) 22  Temp:    98.4 F (36.9 C)  TempSrc:    Oral  SpO2: 90% (!) 86%  90%  Weight:        Exam Gen elderly WF on vent, sedated No rash, cyanosis or gangrene Sclera anicteric, throat w ETT  No jvd or bruits Chest occ rhonchi mostly clear bilat RRR no MRG Abd soft ntnd no mass or ascites +bs obese  GU foley draining clear urine  MS no joint effusions or  deformity Ext moderate diffuse LE edema, no wounds or ulcers Neuro is sedated on vent    Date   Creat  eGFR   2011- 2012  0.80- 1.30    2013- 2015  1.33- 1.59    2016   2.2 > 1.2  AKI episode, eGFR 20 > 43 ml/min    2017- 2018  1.36- 2.73 16- 42 ml/min     Aug 29, 2019  1.31  39 ml/min   10/02/2019   4.11   Home meds:  - diltiazem 120 qd/ atenolol 25 qd/ furosemide 40 mg 4d per wk/ metolazone 2.5 2d per wk/ Kdur 20 qod/ spironolactone 25 2d per wk  - warfarin 1- 46m qd   - levothyroxine 50/ tamsulosin 0.4/ esomeprazole 20 prn  - prednisone 564md per wk  - escitalopram 20/ clonazepam 0.5 bid  - aspirin 81  - montelukast 10/ ipratropium-albuterol qid prn/ fluticasone-  vilanterol qd    CXR 11/21 > 11/25 > 11/28 show worsening patchy and now severe bilat infiltrates   I/O 7.6 L in and 890 cc out, + 6.7 L    UOP 210- 360 cc per day    Na 128  K 4.5  CO2 21  BUN 93  Creat 4.5 (1.31 on admission 11/21)    UA trace protein, 6-10 rbc/ 0-5 wbc     UNa 70, UCr 98  Assessment/ Plan: 1. AKI on CKD 3 - baseline creat 1.2- 1.5.  Declining renal function since admission for COVID +PNA.  AKI is common in serious COVID infection, exact causes other than tubular injury are not clear yet. Urine lytes c/w ATN. Oliguric. No specific Rx to recommend, cont supportive care.  Will need RRT soon if not improving. 2. COVID+ PNA 3. COPD 4. Atrial fib 5. Mild hyponatremia 6. Volume - sig LE edema      RoKelly SplinterMD 09/05/2019, 8:49 PM

## 2019-09-05 NOTE — Progress Notes (Signed)
Updated pt's daughter in law Tammy, all questions answered

## 2019-09-05 NOTE — Progress Notes (Signed)
NAME:  Maureen Wilson, MRN:  [id number], DOB:  28-Jul-1941, LOS: 3 ADMISSION DATE:  08/20/2019, CONSULTATION DATE:  08/28/2019 REFERRING MD:  Allena Katz, CHIEF COMPLAINT:  Respiratory failure   Brief History   COVID-19 pneumonia, progressive hypoxia necessitating intubation after 4 days on HFNC due to work of breathing.  History of present illness   Maureen Wilson is a 78 y.o. female with medical history significant of chronic A. fib, COPD, HTN, who was being treated for SARS-CoV-2 infection at [facility] when she went into acute respiratory failure with hypoxia requiring ET intubation, she was also found to be in A. fib with RVR.  Patient is currently intubated and unable to provide history, no family is available at this time, history obtained from reviewing the medical chart.  According to the previous H&P, she developed nausea, vomiting and generalized weakness on 08/22/2019, toes progressively worsened prompting her to seek medical attention on 08/29/2019.  In the ER she was noted to have acute respiratory failure with hypoxia secondary to Covid pneumonia, she was admitted at [facility].  She was started on remdesivir and Decadron, she is stabilized on 6 to 8 L/min [place] O2.  Rapid response was called 08/22/2019 where she was noted to have increasing work of breathing and subsequently intubated.  She has chronic A. fib, she went into A. fib with RVR and was started on amiodarone.  Past Medical History  COPD (no PFTs) Afib CKD HTN  Significant Hospital Events   Intubated 11/25, subsequently transferred to [facility]  Consults:  PCCM  Procedures:  ETT 11/25  RIJ CVC 11/25  Significant Diagnostic Tests:  11/25 CXR-ET tube 2 cm above the carina, diffuse bilateral infiltrates, tented hemidiaphragms  Micro Data:  covid + Urine 11/21 E. Coli (resistant to ampicillin, Unasyn, ciprofloxacin) Blood 11/21>> NG  Antimicrobials:  Ceftriaxone 11/20, 11/22- 11/23 Remdesivir 11/21-11/25 Dexamethasone  11/22>>  Interim history/subjective:  Oliguric, minimal response to Lasix twice yesterday.  Periods of agitation overnight requiring increased sedation.  This morning she is less responsive during her exam  Objective   Blood pressure (!) 111/59, pulse 69, temperature 98.4 F (36.9 C), temperature source Axillary, resp. rate (!) 23, weight 100.7 kg, SpO2 93 %.    Vent Mode: PRVC FiO2 (%):  [55 %-75 %] 55 % Set Rate:  [22 bmp] 22 bmp Vt Set:  [420 mL] 420 mL PEEP:  [10 cmH20-12 cmH20] 10 cmH20 Plateau Pressure:  [21 cmH20-35 cmH20] 21 cmH20   Intake/Output Summary (Last 24 hours) at 09/05/2019 1500 Last data filed at 09/05/2019 1400 Gross per 24 hour  Intake 2004.87 ml  Output 215 ml  Net 1789.87 ml   Filed Weights   09/03/19 0500 09/04/19 0500  Weight: 94.7 kg 100.7 kg    Examination: General: Elderly woman lying in bed sleeping, moderately sedated HENT: Improved bruising around her right eye.  Eyes anicteric, ETT in place Lungs: Mild tachypnea, decreased basilar breath sounds bilaterally.  Thick, yellow sputum for ET tube. Cardiovascular: Tachycardic, irregular rhythm.  No murmurs.  A. fib with RVR on telemetry, heart rate around 120 Abdomen: Obese, soft, nondistended, nontender Extremities: No clubbing or cyanosis Neuro: Eyes open spontaneously but not tracking.  Following commands less today.  Not answering yes or no questions.  RASS -3. GU: Minimal urine output  Resolved Hospital Problem list     Assessment & Plan:   Acute hypoxic vent dependent respiratory failure due to ARDS from severe COVID-19 viral pneumonia -Continue LTV V, 4 to 8 cc/kg  ideal body weight with goal plateau less than 30 and driving pressure less than 15.  Please follow the ARDS ladder for PEEP and FiO2 titration. -Daily SAT and SBT as appropriate -Goal RASS zero to -1; Versed and fentanyl for sedation. -Goal RASS 0 to -1, on fentanyl and Versed -Dexamethasone; prev completed remdesivir -VAP  prevention protocol -Sputum culture & CXR  History of chronic anticoagulation on Coumadin-previously supratherapeutic INR without evidence of bleeding; now subtherapeutic INR -Start heparin infusion -Serial CBCs  Hyponatremia, suspect hypervolemic given lack of response. Normal osm. -Nephrology consultation; appreciate their assistance -Continue to monitor  Oliguric AKI; nonresponsive to dose Lasix -Allergy consultation; appreciate their recommendations -Monitor I/O and record -Avoid nephrotoxic meds -Renally dose meds -Renal ultrasound -UA, urine sodium and creatinine, urine eosinophils -Checking CVP per nephrology  Chronic atrial fibrillation; with RVR -Restarting diltiazem p.o. (allergies in the chart to both diltiazem and metoprolol, but on diltiazem at home) -Continue enteral amiodarone -Heparin infusion   Spoke to son Maureen Wilson and his wife who are both at home with Covid currently.  They indicated that Maureen Wilson never wanted life support, but that was not known at the time she was intubated at [facility], and when she was in respiratory distress she verbally consented for intubation.  When we discussed her kidney injury and nephrology would evaluate her, dialysis as a potential treatment modality was discussed, although it is not yet known if this would be required.  I asked them to please discuss if they think that she would be okay with this, as this is likely another form of life support that would potentially be against Maureen Wilson' wishes.  Best practice:  Diet: tube feeds Pain/Anxiety/Delirium protocol (if indicated): ordered VAP protocol (if indicated): ordered DVT prophylaxis: heparin gtt GI prophylaxis: pepcid Glucose control: SSI Mobility: bedrest Code Status: full Family Communication: son & daughter-in-law updated today Disposition: ICU  Labs   CBC: Recent Labs  Lab 08/31/19 0416 09/01/19 0705 2019/09/26 0414 2019/09/26 1033 2019/09/26 2145 09/03/19 0459  09/03/19 1138 09/03/19 1506 09/04/19 0304  WBC 5.9 9.2 8.8 7.9  --  11.3*  --  10.4  --   NEUTROABS 5.4 8.5* 8.2* 7.4  --   --   --   --   --   HGB 11.6* 11.6* 11.3* 11.0* 12.9 10.9* 11.2* 11.1* 10.9*  HCT 33.1* 33.2* 34.1* 32.5* 38.0 34.0* 33.0* 33.8* 32.0*  MCV 93.2 93.3 98.3 97.0  --  102.1*  --  100.6*  --   PLT 108* 130* 150 151  --  157  --  183  --     Basic Metabolic Panel: Recent Labs  Lab 2019/09/26 0414 2019/09/26 1033 2019/09/26 1707  09/03/19 0459 09/03/19 1138 09/04/19 0304 09/04/19 0450 09/05/19 0405  NA 127* 126*  --    < > 128* 126* 126* 128* 128*  K 4.2 3.9  --    < > 4.7 4.0 3.9 4.0 4.1  CL 96* 92*  --   --  93*  --   --  95* 93*  CO2 20* 24  --   --  21*  --   --  23 22  GLUCOSE 175* 172*  --   --  172*  --   --  162* 198*  BUN 27* 30*  --   --  37*  --   --  48* 77*  CREATININE 1.25* 1.25*  --   --  1.96*  --   --  2.55* 3.44*  CALCIUM 8.4* 8.1*  --   --  8.7*  --   --  8.3* 8.5*  MG 1.8  --  1.9  --  1.8  --   --  1.8 1.9  PHOS  --   --  3.1  --  3.4  --   --  3.6 4.5   < > = values in this interval not displayed.   GFR: Estimated Creatinine Clearance: 15.3 mL/min (A) (by C-G formula based on SCr of 3.44 mg/dL (H)). Recent Labs  Lab 08/09/2019 0414 08/31/2019 1033 09/03/19 0459 09/03/19 1506  WBC 8.8 7.9 11.3* 10.4  LATICACIDVEN  --  1.7  --   --     Liver Function Tests: Recent Labs  Lab 08/31/19 0416 09/01/19 0705 08/27/2019 0414 08/13/2019 1033 09/03/19 0459  AST 46* 52* 50* 45* 46*  ALT 21 21 22 23 22   ALKPHOS 58 63 64 68 76  BILITOT 0.9 1.0 0.9 1.1 1.1  PROT 5.7* 5.7* 5.7* 5.6* 6.0*  ALBUMIN 2.6* 2.6* 2.6* 2.6* 2.6*   No results for input(s): LIPASE, AMYLASE in the last 168 hours. No results for input(s): AMMONIA in the last 168 hours.  ABG    Component Value Date/Time   PHART 7.330 (L) 09/04/2019 0304   PCO2ART 43.5 09/04/2019 0304   PO2ART 227.0 (H) 09/04/2019 0304   HCO3 23.0 09/04/2019 0304   TCO2 24 09/04/2019 0304    ACIDBASEDEF 3.0 (H) 09/04/2019 0304   O2SAT 100.0 09/04/2019 0304     Coagulation Profile: Recent Labs  Lab 09/01/19 0705 09/05/2019 0414 09/03/19 0500 09/04/19 0450 09/05/19 0405  INR 6.5* 9.7* 6.2* 3.8* 1.7*    Cardiac Enzymes: No results for input(s): CKTOTAL, CKMB, CKMBINDEX, TROPONINI in the last 168 hours.  HbA1C: Hgb A1c MFr Bld  Date/Time Value Ref Range Status  08/29/2019 05:06 AM 5.9 (H) 4.8 - 5.6 % Final    Comment:    (NOTE) Pre diabetes:          5.7%-6.4% Diabetes:              >6.4% Glycemic control for   <7.0% adults with diabetes   04/05/2010 05:55 AM (H) <5.7 % Final   6.0 (NOTE)                                                                       According to the ADA Clinical Practice Recommendations for 2011, when HbA1c is used as a screening test:   >=6.5%   Diagnostic of Diabetes Mellitus           (if abnormal result  is confirmed)  5.7-6.4%   Increased risk of developing Diabetes Mellitus  References:Diagnosis and Classification of Diabetes Mellitus,Diabetes WUJW,1191,47(WGNFA 1):S62-S69 and Standards of Medical Care in         Diabetes - 2011,Diabetes Care,2011,34  (Suppl 1):S11-S61.    CBG: Recent Labs  Lab 09/04/19 2015 09/05/19 0015 09/05/19 0405 09/05/19 0753 09/05/19 1131  GLUCAP 120* 129* 211* 245* 212*       Critical care time: 55 minutes    Julian Hy, DO 09/05/19 3:00 PM [facility]

## 2019-09-06 ENCOUNTER — Inpatient Hospital Stay (HOSPITAL_COMMUNITY): Payer: Medicare Other

## 2019-09-06 DIAGNOSIS — Z978 Presence of other specified devices: Secondary | ICD-10-CM

## 2019-09-06 DIAGNOSIS — Z7189 Other specified counseling: Secondary | ICD-10-CM

## 2019-09-06 LAB — BASIC METABOLIC PANEL
Anion gap: 13 (ref 5–15)
BUN: 105 mg/dL — ABNORMAL HIGH (ref 8–23)
CO2: 20 mmol/L — ABNORMAL LOW (ref 22–32)
Calcium: 8 mg/dL — ABNORMAL LOW (ref 8.9–10.3)
Chloride: 92 mmol/L — ABNORMAL LOW (ref 98–111)
Creatinine, Ser: 4.52 mg/dL — ABNORMAL HIGH (ref 0.44–1.00)
GFR calc Af Amer: 10 mL/min — ABNORMAL LOW (ref >60)
GFR calc non Af Amer: 9 mL/min — ABNORMAL LOW (ref >60)
Glucose, Bld: 216 mg/dL — ABNORMAL HIGH (ref 70–99)
Potassium: 4.5 mmol/L (ref 3.5–5.1)
Sodium: 125 mmol/L — ABNORMAL LOW (ref 135–145)

## 2019-09-06 LAB — CBC
HCT: 33.7 % — ABNORMAL LOW (ref 36.0–46.0)
Hemoglobin: 10.9 g/dL — ABNORMAL LOW (ref 12.0–15.0)
MCH: 32.8 pg (ref 26.0–34.0)
MCHC: 32.3 g/dL (ref 30.0–36.0)
MCV: 101.5 fL — ABNORMAL HIGH (ref 80.0–100.0)
Platelets: 160 10*3/uL (ref 150–400)
RBC: 3.32 MIL/uL — ABNORMAL LOW (ref 3.87–5.11)
RDW: 14.4 % (ref 11.5–15.5)
WBC: 10.6 10*3/uL — ABNORMAL HIGH (ref 4.0–10.5)
nRBC: 0.3 % — ABNORMAL HIGH (ref 0.0–0.2)

## 2019-09-06 LAB — POCT I-STAT 7, (LYTES, BLD GAS, ICA,H+H)
Acid-base deficit: 7 mmol/L — ABNORMAL HIGH (ref 0.0–2.0)
Bicarbonate: 20.7 mmol/L (ref 20.0–28.0)
Calcium, Ion: 1.21 mmol/L (ref 1.15–1.40)
HCT: 31 % — ABNORMAL LOW (ref 36.0–46.0)
Hemoglobin: 10.5 g/dL — ABNORMAL LOW (ref 12.0–15.0)
O2 Saturation: 94 %
Patient temperature: 37
Potassium: 4.5 mmol/L (ref 3.5–5.1)
Sodium: 123 mmol/L — ABNORMAL LOW (ref 135–145)
TCO2: 22 mmol/L (ref 22–32)
pCO2 arterial: 51 mmHg — ABNORMAL HIGH (ref 32.0–48.0)
pH, Arterial: 7.217 — ABNORMAL LOW (ref 7.350–7.450)
pO2, Arterial: 88 mmHg (ref 83.0–108.0)

## 2019-09-06 LAB — HEPARIN LEVEL (UNFRACTIONATED)
Heparin Unfractionated: 0.54 IU/mL (ref 0.30–0.70)
Heparin Unfractionated: 0.65 IU/mL (ref 0.30–0.70)

## 2019-09-06 LAB — GLUCOSE, CAPILLARY
Glucose-Capillary: 186 mg/dL — ABNORMAL HIGH (ref 70–99)
Glucose-Capillary: 207 mg/dL — ABNORMAL HIGH (ref 70–99)
Glucose-Capillary: 230 mg/dL — ABNORMAL HIGH (ref 70–99)

## 2019-09-06 LAB — PROTIME-INR
INR: 1.3 — ABNORMAL HIGH (ref 0.8–1.2)
Prothrombin Time: 16.2 seconds — ABNORMAL HIGH (ref 11.4–15.2)

## 2019-09-06 LAB — D-DIMER, QUANTITATIVE: D-Dimer, Quant: 1.47 ug/mL-FEU — ABNORMAL HIGH (ref 0.00–0.50)

## 2019-09-06 LAB — MAGNESIUM: Magnesium: 2 mg/dL (ref 1.7–2.4)

## 2019-09-06 LAB — C-REACTIVE PROTEIN: CRP: 4.5 mg/dL — ABNORMAL HIGH (ref <1.0)

## 2019-09-06 LAB — FERRITIN: Ferritin: 229 ng/mL (ref 11–307)

## 2019-09-06 LAB — PHOSPHORUS: Phosphorus: 4.7 mg/dL — ABNORMAL HIGH (ref 2.5–4.6)

## 2019-09-06 MED ORDER — DIPHENHYDRAMINE HCL 50 MG/ML IJ SOLN: 25.0000 mg | INTRAMUSCULAR | Status: DC | PRN

## 2019-09-06 MED ORDER — POLYVINYL ALCOHOL 1.4 % OP SOLN
1.0000 [drp] | Freq: Four times a day (QID) | OPHTHALMIC | Status: DC | PRN
  Filled 2019-09-06: qty 15

## 2019-09-06 MED ORDER — FENTANYL BOLUS VIA INFUSION
100.0000 ug | INTRAVENOUS | Status: DC | PRN
  Filled 2019-09-06: qty 100

## 2019-09-06 MED ORDER — GLYCOPYRROLATE 1 MG PO TABS
1.0000 mg | ORAL_TABLET | ORAL | Status: DC | PRN
  Filled 2019-09-06: qty 1

## 2019-09-06 MED ORDER — ALBUTEROL SULFATE (2.5 MG/3ML) 0.083% IN NEBU: 2.5000 mg | INHALATION_SOLUTION | RESPIRATORY_TRACT | Status: DC | PRN

## 2019-09-06 MED ORDER — ACETAMINOPHEN 325 MG PO TABS: 650.0000 mg | ORAL_TABLET | Freq: Four times a day (QID) | ORAL | Status: DC | PRN

## 2019-09-06 MED ORDER — MIDAZOLAM 50MG/50ML (1MG/ML) PREMIX INFUSION
0.0000 mg/h | INTRAVENOUS | Status: DC
  Administered 2019-09-06: 1 mg/h via INTRAVENOUS
  Filled 2019-09-06: qty 50

## 2019-09-06 MED ORDER — GLYCOPYRROLATE 0.2 MG/ML IJ SOLN
0.2000 mg | INTRAMUSCULAR | Status: DC | PRN
  Administered 2019-09-06 (×2): 0.2 mg via INTRAVENOUS
  Filled 2019-09-06 (×2): qty 1

## 2019-09-06 MED ORDER — MIDAZOLAM HCL 2 MG/2ML IJ SOLN: 1.0000 mg | INTRAMUSCULAR | Status: DC | PRN

## 2019-09-06 MED ORDER — MIDAZOLAM BOLUS VIA INFUSION (WITHDRAWAL LIFE SUSTAINING TX)
2.0000 mg | INTRAVENOUS | Status: DC | PRN
  Filled 2019-09-06: qty 2

## 2019-09-06 MED ORDER — FENTANYL 2500MCG IN NS 250ML (10MCG/ML) PREMIX INFUSION
0.0000 ug/h | INTRAVENOUS | Status: DC
  Administered 2019-09-06: 400 ug/h via INTRAVENOUS
  Filled 2019-09-06: qty 250

## 2019-09-06 MED ORDER — GLYCOPYRROLATE 0.2 MG/ML IJ SOLN: 0.2000 mg | INTRAMUSCULAR | Status: DC | PRN

## 2019-09-06 MED ORDER — ACETAMINOPHEN 650 MG RE SUPP: 650.0000 mg | Freq: Four times a day (QID) | RECTAL | Status: DC | PRN

## 2019-09-06 MED ORDER — FENTANYL CITRATE (PF) 100 MCG/2ML IJ SOLN: 50.0000 ug | INTRAMUSCULAR | Status: DC | PRN

## 2019-09-07 LAB — CULTURE, RESPIRATORY W GRAM STAIN

## 2019-09-08 NOTE — Progress Notes (Signed)
I spoke to Maureen Wilson's son Maureen Wilson Maureen daughter in [employer] [place]. They do not think that she would want dialysis as she had previously indicated she wanted to pass away naturally Maureen probably wound not have wanted to be intubated. They understand that progressive renal failure Maureen hypervolemia will be irreversible, Maureen continuing MV at that point would be futile. They are interested in withdrawing aggressive care today Maureen pursuing comfort-based care. Code status changed to DNR Maureen comfort care orders in chart.  Julian Hy, DO 08/09/2019 12:02 PM [facility]

## 2019-09-08 NOTE — Progress Notes (Signed)
No heart sounds or breath sounds auscultated.  TOD 1844.  Two RN verification with primary nurse Shelly Rubenstein, RN

## 2019-09-08 NOTE — Progress Notes (Signed)
[place] for heparin Indication: atrial fibrillation  Allergies  Allergen Reactions  . Amlodipine Swelling and Other (See Comments)    Reaction:  Lip/mouth swelling   . Hydralazine Swelling and Other (See Comments)    Reaction:  Lip/mouth swelling   . Iohexol Hives and Other (See Comments)    Desc: Pt developed hives after receiving iv dye for ct scan.  suggest she be premedicated for future exams, Onset Date: [id number]   . Metoprolol Swelling and Other (See Comments)    Reaction:  Lip/mouth swelling   . Olmesartan Swelling and Other (See Comments)    Reaction:  Lip/mouth swelling   . Other Swelling    Lip/mouth swelling  . Ramipril Swelling and Other (See Comments)    Reaction:  Lip/mouth swelling   . Risperidone Other (See Comments)  . Sulfa Antibiotics Swelling and Other (See Comments)    Reaction:  Lip/mouth swelling  angioedema  . Telmisartan Swelling and Other (See Comments)    Reaction:  Lip/mouth swelling   . Tiotropium Swelling and Other (See Comments)    Reaction:  Lip/mouth swelling     Patient Measurements: Weight: 222 lb 0.1 oz (100.7 kg) Heparin Dosing Weight: 76 kg   Vital Signs: Temp: 97.5 F (36.4 C) (11/29 0800) Temp Source: Axillary (11/29 0800) BP: 135/61 (11/29 0915) Pulse Rate: 110 (11/29 0915)  Labs: Recent Labs    09/03/19 1506 09/04/19 0304  09/04/19 0450 09/05/19 0405 09/05/19 1820 09/05/19 1958 08/23/2019 0122 08/14/2019 0400 08/23/2019 0452 08/17/2019 0814  HGB 11.1* 10.9*  --   --   --   --   --   --  10.9* 10.5*  --   HCT 33.8* 32.0*  --   --   --   --   --   --  33.7* 31.0*  --   PLT 183  --   --   --   --   --   --   --  160  --   --   LABPROT  --   --   --  37.3* 20.3*  --   --   --  16.2*  --   --   INR  --   --   --  3.8* 1.7*  --   --   --  1.3*  --   --   HEPARINUNFRC  --   --   --   --   --   --   --  0.54  --   --  0.65  CREATININE  --   --    < > 2.55* 3.44* 4.11*  --   --  4.52*  --    --   TROPONINIHS  --   --   --   --   --  55* 48*  --   --   --   --    < > = values in this interval not displayed.    Estimated Creatinine Clearance: 11.6 mL/min (A) (by C-G formula based on SCr of 4.52 mg/dL (H)).  Assessment: 5 YOF with h/o of Afib on warfarin prior to admission. INR on admission was supratherapeutic at 9.7. INR has trended down to 1.7 today after 2 doses of Vit K. Pharmacy consulted to start IV heparin.   Heparin level 0.65 (therapeutic) on gtt at 1000 units/hr. H/H low stable, Plt wnl but trending down.  Goal of Therapy:  Heparin level 0.3-0.7 units/ml Monitor platelets by anticoagulation  protocol: Yes   Plan:  Continue IV heparin at 1000 units/hr Monitor daily HL, CBC and s/s of bleeding   Vinnie Level, PharmD., BCPS Clinical Pharmacist Clinical phone for 09/26/19 until 5pm: [phone]

## 2019-09-08 NOTE — Progress Notes (Signed)
Extubated patient for comfort care at 1300 to a 2 LPM [place].

## 2019-09-08 NOTE — Death Summary Note (Signed)
Physician Discharge Summary   Patient ID: Maureen Wilson [id number] y.o. 1941-03-27  Admit date: 09-09-19  Discharge date and time: No discharge date for patient encounter.   Admitting Physician: Juanito Doom, MD   Discharge Physician: Julian Hy, DO  Admission Diagnoses: TACHYCARDIA COVID 19 VIRUS INFECTION  Discharge Diagnoses:  COVID-19 viral pneumonia Acute hypoxic respiratory failure due to ARDS due to COVID-19 viral pneumonia Acute kidney injury with anuria Hyponatremia Chronic atrial fibrillation Chronic anticoagulation Obesity  Admission Condition: critical  Discharged Condition: deceased  Indication for Admission: Acute hypoxic respiratory failure due to COVID-19  Hospital Course: [name] was admitted to the hospital requiring supplemental oxygen for COVID-19 pneumonia.  She deteriorated to the point that she required intubation and mechanical ventilation.  She was transferred to [facility].  She was unable to be weaned from mechanical ventilation.  She developed an uric renal failure with progressively worsening hyponatremia and hypervolemia.  Her family felt as though initiation of dialysis was not consistent with her wishes and chose to withdraw care.  She died comfortably with her nurses at bedside.  Consults: nephrology  Significant Diagnostic Studies: COVID-19 positive  Treatments: Mechanical ventilation, supportive ICU care  [name] was examined multiple times today.  This morning she was intubated, sedated, breathing comfortably on the vent.  Mildly tachycardic, irregular rhythm.  Peripheral edema was present.  Healing bruising on her face and right arm.  Anuretic, Foley in place.  Disposition: Morgue    Critical care time provided today 45 minutes, including assessment, treatment, goals of care discussions, and aggressive palliation of discomfort.  Signed: Julian Hy 08/26/2019 9:19 PM

## 2019-09-08 NOTE — Progress Notes (Signed)
Wasted 100 mL of fentanyl and 45 mL of versed with Evlyn Kanner, RN

## 2019-09-08 NOTE — Progress Notes (Addendum)
Patient had passed prior to shift change previous RN contacted family and MD.  Patient was cleaned and night Charge RN removed all lines prior to cleaning her.  Patient transported by stretcher to morgue with Agricultural consultant.

## 2019-09-08 NOTE — Progress Notes (Signed)
Maureen Wilson for heparin Indication: atrial fibrillation  Allergies  Allergen Reactions  . Amlodipine Swelling and Other (See Comments)    Reaction:  Lip/mouth swelling   . Hydralazine Swelling and Other (See Comments)    Reaction:  Lip/mouth swelling   . Iohexol Hives and Other (See Comments)    Desc: Pt developed hives after receiving iv dye for ct scan.  suggest she be premedicated for future exams, Onset Date: [id number]   . Metoprolol Swelling and Other (See Comments)    Reaction:  Lip/mouth swelling   . Olmesartan Swelling and Other (See Comments)    Reaction:  Lip/mouth swelling   . Other Swelling    Lip/mouth swelling  . Ramipril Swelling and Other (See Comments)    Reaction:  Lip/mouth swelling   . Risperidone Other (See Comments)  . Sulfa Antibiotics Swelling and Other (See Comments)    Reaction:  Lip/mouth swelling  angioedema  . Telmisartan Swelling and Other (See Comments)    Reaction:  Lip/mouth swelling   . Tiotropium Swelling and Other (See Comments)    Reaction:  Lip/mouth swelling     Patient Measurements: Weight: 222 lb 0.1 oz (100.7 kg) Heparin Dosing Weight: 76 kg   Vital Signs: Temp: 98.4 F (36.9 C) (11/28 2000) Temp Source: Oral (11/28 2000) BP: 95/52 (11/29 0148) Pulse Rate: 106 (11/29 0148)  Labs: Recent Labs    09/03/19 0459 09/03/19 0500 09/03/19 1138 09/03/19 1506 09/04/19 0304 09/04/19 0450 09/05/19 0405 09/05/19 1820 09/05/19 1958 09/05/2019 0122  HGB 10.9*  --  11.2* 11.1* 10.9*  --   --   --   --   --   HCT 34.0*  --  33.0* 33.8* 32.0*  --   --   --   --   --   PLT 157  --   --  183  --   --   --   --   --   --   LABPROT  --  54.8*  --   --   --  37.3* 20.3*  --   --   --   INR  --  6.2*  --   --   --  3.8* 1.7*  --   --   --   HEPARINUNFRC  --   --   --   --   --   --   --   --   --  0.54  CREATININE 1.96*  --   --   --   --  2.55* 3.44* 4.11*  --   --   TROPONINIHS  --   --   --   --   --    --   --  55* 48*  --     Estimated Creatinine Clearance: 12.8 mL/min (A) (by C-G formula based on SCr of 4.11 mg/dL (H)).  Assessment: 53 YOF with h/o of Afib on warfarin prior to admission. INR on admission was supratherapeutic at 9.7. INR has trended down to 1.7 today after 2 doses of Vit K. Pharmacy consulted to start IV heparin.   Heparin level 0.54 (therapeutic) on gtt at 1000 units/hr. No bleeding noted.  Goal of Therapy:  Heparin level 0.3-0.7 units/ml Monitor platelets by anticoagulation protocol: Yes   Plan:  Continue IV heparin at 1000 units/hr F/u 6 hr confirmatory HL   Sherlon Handing, PharmD, BCPS Please see amion for complete clinical pharmacist phone list 08/22/2019 2:50 AM

## 2019-09-08 DEATH — deceased

## 2019-09-10 DIAGNOSIS — U071 COVID-19: Secondary | ICD-10-CM
